# Patient Record
Sex: Male | Born: 1945
Health system: Southern US, Community
[De-identification: ages and names within clinical notes are randomized; demographics above are authoritative.]

## PROBLEM LIST (undated history)

## (undated) DIAGNOSIS — N289 Disorder of kidney and ureter, unspecified: Secondary | ICD-10-CM

## (undated) DIAGNOSIS — D689 Coagulation defect, unspecified: Secondary | ICD-10-CM

## (undated) DIAGNOSIS — C801 Malignant (primary) neoplasm, unspecified: Secondary | ICD-10-CM

## (undated) DIAGNOSIS — H269 Unspecified cataract: Secondary | ICD-10-CM

## (undated) DIAGNOSIS — C61 Malignant neoplasm of prostate: Secondary | ICD-10-CM

## (undated) DIAGNOSIS — H409 Unspecified glaucoma: Secondary | ICD-10-CM

## (undated) DIAGNOSIS — I82409 Acute embolism and thrombosis of unspecified deep veins of unspecified lower extremity: Secondary | ICD-10-CM

## (undated) DIAGNOSIS — Z923 Personal history of irradiation: Secondary | ICD-10-CM

## (undated) DIAGNOSIS — I10 Essential (primary) hypertension: Secondary | ICD-10-CM

## (undated) DIAGNOSIS — M1990 Unspecified osteoarthritis, unspecified site: Secondary | ICD-10-CM

## (undated) DIAGNOSIS — M109 Gout, unspecified: Secondary | ICD-10-CM

## (undated) DIAGNOSIS — M25559 Pain in unspecified hip: Secondary | ICD-10-CM

## (undated) HISTORY — DX: Personal history of irradiation: Z92.3

## (undated) HISTORY — DX: Coagulation defect, unspecified: D68.9

## (undated) HISTORY — DX: Unspecified osteoarthritis, unspecified site: M19.90

## (undated) HISTORY — PX: JOINT REPLACEMENT: SHX530

## (undated) HISTORY — DX: Unspecified cataract: H26.9

## (undated) HISTORY — DX: Acute embolism and thrombosis of unspecified deep veins of unspecified lower extremity: I82.409

## (undated) HISTORY — PX: SUPRAPUBIC PROSTATECTOMY: SUR1056

---

## 1997-09-25 ENCOUNTER — Ambulatory Visit (HOSPITAL_COMMUNITY): Admission: RE | Admit: 1997-09-25 | Discharge: 1997-09-25 | Payer: Self-pay | Admitting: *Deleted

## 1999-12-20 ENCOUNTER — Emergency Department (HOSPITAL_COMMUNITY): Admission: EM | Admit: 1999-12-20 | Discharge: 1999-12-20 | Payer: Self-pay | Admitting: Emergency Medicine

## 1999-12-20 ENCOUNTER — Encounter: Payer: Self-pay | Admitting: Emergency Medicine

## 2003-01-19 ENCOUNTER — Ambulatory Visit (HOSPITAL_COMMUNITY): Admission: RE | Admit: 2003-01-19 | Discharge: 2003-01-19 | Payer: Self-pay | Admitting: Internal Medicine

## 2003-01-19 ENCOUNTER — Encounter: Payer: Self-pay | Admitting: Internal Medicine

## 2003-02-05 ENCOUNTER — Inpatient Hospital Stay (HOSPITAL_COMMUNITY): Admission: RE | Admit: 2003-02-05 | Discharge: 2003-02-08 | Payer: Self-pay | Admitting: Orthopedic Surgery

## 2003-03-14 ENCOUNTER — Encounter: Admission: RE | Admit: 2003-03-14 | Discharge: 2003-03-14 | Payer: Self-pay | Admitting: Internal Medicine

## 2003-04-02 ENCOUNTER — Ambulatory Visit (HOSPITAL_COMMUNITY): Admission: RE | Admit: 2003-04-02 | Discharge: 2003-04-02 | Payer: Self-pay | Admitting: Gastroenterology

## 2010-08-14 HISTORY — PX: COLONOSCOPY: SHX174

## 2011-05-08 DIAGNOSIS — N529 Male erectile dysfunction, unspecified: Secondary | ICD-10-CM | POA: Diagnosis not present

## 2011-05-08 DIAGNOSIS — C61 Malignant neoplasm of prostate: Secondary | ICD-10-CM | POA: Diagnosis not present

## 2011-05-15 DIAGNOSIS — Z79899 Other long term (current) drug therapy: Secondary | ICD-10-CM | POA: Diagnosis not present

## 2011-05-15 DIAGNOSIS — M169 Osteoarthritis of hip, unspecified: Secondary | ICD-10-CM | POA: Diagnosis not present

## 2011-05-15 DIAGNOSIS — Z7901 Long term (current) use of anticoagulants: Secondary | ICD-10-CM | POA: Diagnosis not present

## 2011-05-15 DIAGNOSIS — Z7982 Long term (current) use of aspirin: Secondary | ICD-10-CM | POA: Diagnosis not present

## 2011-05-15 DIAGNOSIS — M25559 Pain in unspecified hip: Secondary | ICD-10-CM | POA: Diagnosis not present

## 2011-05-15 DIAGNOSIS — M1610 Unilateral primary osteoarthritis, unspecified hip: Secondary | ICD-10-CM | POA: Diagnosis not present

## 2011-05-18 DIAGNOSIS — Z7901 Long term (current) use of anticoagulants: Secondary | ICD-10-CM | POA: Diagnosis not present

## 2011-05-26 DIAGNOSIS — M1610 Unilateral primary osteoarthritis, unspecified hip: Secondary | ICD-10-CM | POA: Diagnosis not present

## 2011-05-26 DIAGNOSIS — M169 Osteoarthritis of hip, unspecified: Secondary | ICD-10-CM | POA: Diagnosis not present

## 2011-05-26 DIAGNOSIS — M25559 Pain in unspecified hip: Secondary | ICD-10-CM | POA: Diagnosis not present

## 2011-06-04 DIAGNOSIS — Z7901 Long term (current) use of anticoagulants: Secondary | ICD-10-CM | POA: Diagnosis not present

## 2011-06-18 DIAGNOSIS — I824Y9 Acute embolism and thrombosis of unspecified deep veins of unspecified proximal lower extremity: Secondary | ICD-10-CM | POA: Diagnosis not present

## 2011-06-18 DIAGNOSIS — M25559 Pain in unspecified hip: Secondary | ICD-10-CM | POA: Diagnosis not present

## 2011-06-18 DIAGNOSIS — Z7901 Long term (current) use of anticoagulants: Secondary | ICD-10-CM | POA: Diagnosis not present

## 2011-06-18 DIAGNOSIS — Z5181 Encounter for therapeutic drug level monitoring: Secondary | ICD-10-CM | POA: Diagnosis not present

## 2011-06-18 DIAGNOSIS — Z01818 Encounter for other preprocedural examination: Secondary | ICD-10-CM | POA: Diagnosis not present

## 2011-06-25 DIAGNOSIS — Z7901 Long term (current) use of anticoagulants: Secondary | ICD-10-CM | POA: Diagnosis not present

## 2011-06-30 DIAGNOSIS — Z86718 Personal history of other venous thrombosis and embolism: Secondary | ICD-10-CM | POA: Diagnosis not present

## 2011-06-30 DIAGNOSIS — Z5181 Encounter for therapeutic drug level monitoring: Secondary | ICD-10-CM | POA: Diagnosis not present

## 2011-06-30 DIAGNOSIS — Z7901 Long term (current) use of anticoagulants: Secondary | ICD-10-CM | POA: Diagnosis not present

## 2011-07-09 DIAGNOSIS — M25559 Pain in unspecified hip: Secondary | ICD-10-CM | POA: Diagnosis not present

## 2011-07-20 DIAGNOSIS — I824Y9 Acute embolism and thrombosis of unspecified deep veins of unspecified proximal lower extremity: Secondary | ICD-10-CM | POA: Diagnosis not present

## 2011-08-05 DIAGNOSIS — N529 Male erectile dysfunction, unspecified: Secondary | ICD-10-CM | POA: Diagnosis not present

## 2011-08-05 DIAGNOSIS — N390 Urinary tract infection, site not specified: Secondary | ICD-10-CM | POA: Diagnosis not present

## 2011-08-16 DIAGNOSIS — M169 Osteoarthritis of hip, unspecified: Secondary | ICD-10-CM | POA: Diagnosis not present

## 2011-08-16 DIAGNOSIS — I129 Hypertensive chronic kidney disease with stage 1 through stage 4 chronic kidney disease, or unspecified chronic kidney disease: Secondary | ICD-10-CM | POA: Diagnosis not present

## 2011-08-16 DIAGNOSIS — E785 Hyperlipidemia, unspecified: Secondary | ICD-10-CM | POA: Diagnosis not present

## 2011-08-16 DIAGNOSIS — N183 Chronic kidney disease, stage 3 unspecified: Secondary | ICD-10-CM | POA: Diagnosis not present

## 2011-08-16 DIAGNOSIS — M1610 Unilateral primary osteoarthritis, unspecified hip: Secondary | ICD-10-CM | POA: Diagnosis not present

## 2011-08-16 DIAGNOSIS — M25559 Pain in unspecified hip: Secondary | ICD-10-CM | POA: Diagnosis not present

## 2011-08-17 DIAGNOSIS — I824Y9 Acute embolism and thrombosis of unspecified deep veins of unspecified proximal lower extremity: Secondary | ICD-10-CM | POA: Diagnosis not present

## 2011-08-19 DIAGNOSIS — M25559 Pain in unspecified hip: Secondary | ICD-10-CM | POA: Diagnosis not present

## 2011-08-19 DIAGNOSIS — M169 Osteoarthritis of hip, unspecified: Secondary | ICD-10-CM | POA: Diagnosis not present

## 2011-08-19 DIAGNOSIS — N183 Chronic kidney disease, stage 3 unspecified: Secondary | ICD-10-CM | POA: Diagnosis not present

## 2011-08-19 DIAGNOSIS — M109 Gout, unspecified: Secondary | ICD-10-CM | POA: Diagnosis not present

## 2011-08-19 DIAGNOSIS — I129 Hypertensive chronic kidney disease with stage 1 through stage 4 chronic kidney disease, or unspecified chronic kidney disease: Secondary | ICD-10-CM | POA: Diagnosis not present

## 2011-08-19 DIAGNOSIS — I82819 Embolism and thrombosis of superficial veins of unspecified lower extremities: Secondary | ICD-10-CM | POA: Diagnosis not present

## 2011-08-19 DIAGNOSIS — M948X9 Other specified disorders of cartilage, unspecified sites: Secondary | ICD-10-CM | POA: Diagnosis not present

## 2011-08-19 DIAGNOSIS — Z01818 Encounter for other preprocedural examination: Secondary | ICD-10-CM | POA: Diagnosis not present

## 2011-08-19 DIAGNOSIS — Z87440 Personal history of urinary (tract) infections: Secondary | ICD-10-CM | POA: Diagnosis not present

## 2011-08-19 DIAGNOSIS — M1610 Unilateral primary osteoarthritis, unspecified hip: Secondary | ICD-10-CM | POA: Diagnosis not present

## 2011-08-19 DIAGNOSIS — Z79899 Other long term (current) drug therapy: Secondary | ICD-10-CM | POA: Diagnosis not present

## 2011-08-19 DIAGNOSIS — Z8546 Personal history of malignant neoplasm of prostate: Secondary | ICD-10-CM | POA: Diagnosis not present

## 2011-08-19 DIAGNOSIS — E785 Hyperlipidemia, unspecified: Secondary | ICD-10-CM | POA: Diagnosis not present

## 2011-08-19 DIAGNOSIS — Z7982 Long term (current) use of aspirin: Secondary | ICD-10-CM | POA: Diagnosis not present

## 2011-09-01 DIAGNOSIS — I129 Hypertensive chronic kidney disease with stage 1 through stage 4 chronic kidney disease, or unspecified chronic kidney disease: Secondary | ICD-10-CM | POA: Diagnosis not present

## 2011-09-01 DIAGNOSIS — M169 Osteoarthritis of hip, unspecified: Secondary | ICD-10-CM | POA: Diagnosis not present

## 2011-09-01 DIAGNOSIS — M25559 Pain in unspecified hip: Secondary | ICD-10-CM | POA: Diagnosis not present

## 2011-09-01 DIAGNOSIS — E785 Hyperlipidemia, unspecified: Secondary | ICD-10-CM | POA: Diagnosis not present

## 2011-09-01 DIAGNOSIS — M1610 Unilateral primary osteoarthritis, unspecified hip: Secondary | ICD-10-CM | POA: Diagnosis not present

## 2011-09-01 DIAGNOSIS — M948X9 Other specified disorders of cartilage, unspecified sites: Secondary | ICD-10-CM | POA: Diagnosis not present

## 2011-09-01 DIAGNOSIS — N183 Chronic kidney disease, stage 3 unspecified: Secondary | ICD-10-CM | POA: Diagnosis not present

## 2011-09-13 DIAGNOSIS — N390 Urinary tract infection, site not specified: Secondary | ICD-10-CM | POA: Diagnosis present

## 2011-09-13 DIAGNOSIS — M1A9XX0 Chronic gout, unspecified, without tophus (tophi): Secondary | ICD-10-CM | POA: Diagnosis not present

## 2011-09-13 DIAGNOSIS — I129 Hypertensive chronic kidney disease with stage 1 through stage 4 chronic kidney disease, or unspecified chronic kidney disease: Secondary | ICD-10-CM | POA: Diagnosis not present

## 2011-09-13 DIAGNOSIS — Z8546 Personal history of malignant neoplasm of prostate: Secondary | ICD-10-CM | POA: Diagnosis not present

## 2011-09-13 DIAGNOSIS — Z7982 Long term (current) use of aspirin: Secondary | ICD-10-CM | POA: Diagnosis not present

## 2011-09-13 DIAGNOSIS — A498 Other bacterial infections of unspecified site: Secondary | ICD-10-CM | POA: Diagnosis present

## 2011-09-13 DIAGNOSIS — Z86718 Personal history of other venous thrombosis and embolism: Secondary | ICD-10-CM | POA: Diagnosis not present

## 2011-09-13 DIAGNOSIS — Z7901 Long term (current) use of anticoagulants: Secondary | ICD-10-CM | POA: Diagnosis not present

## 2011-09-13 DIAGNOSIS — M1A00X0 Idiopathic chronic gout, unspecified site, without tophus (tophi): Secondary | ICD-10-CM | POA: Diagnosis not present

## 2011-09-13 DIAGNOSIS — N189 Chronic kidney disease, unspecified: Secondary | ICD-10-CM | POA: Diagnosis not present

## 2011-09-13 DIAGNOSIS — E785 Hyperlipidemia, unspecified: Secondary | ICD-10-CM | POA: Diagnosis present

## 2011-09-13 DIAGNOSIS — M25473 Effusion, unspecified ankle: Secondary | ICD-10-CM | POA: Diagnosis not present

## 2011-09-13 DIAGNOSIS — M899 Disorder of bone, unspecified: Secondary | ICD-10-CM | POA: Diagnosis not present

## 2011-09-13 DIAGNOSIS — M7750 Other enthesopathy of unspecified foot: Secondary | ICD-10-CM | POA: Diagnosis not present

## 2011-09-13 DIAGNOSIS — L02619 Cutaneous abscess of unspecified foot: Secondary | ICD-10-CM | POA: Diagnosis not present

## 2011-09-13 DIAGNOSIS — M109 Gout, unspecified: Secondary | ICD-10-CM | POA: Diagnosis not present

## 2011-09-13 DIAGNOSIS — E860 Dehydration: Secondary | ICD-10-CM | POA: Diagnosis present

## 2011-09-13 DIAGNOSIS — M25476 Effusion, unspecified foot: Secondary | ICD-10-CM | POA: Diagnosis not present

## 2011-09-13 DIAGNOSIS — N179 Acute kidney failure, unspecified: Secondary | ICD-10-CM | POA: Diagnosis present

## 2011-09-13 DIAGNOSIS — L03119 Cellulitis of unspecified part of limb: Secondary | ICD-10-CM | POA: Diagnosis not present

## 2011-09-13 DIAGNOSIS — M19079 Primary osteoarthritis, unspecified ankle and foot: Secondary | ICD-10-CM | POA: Diagnosis not present

## 2011-09-13 DIAGNOSIS — N183 Chronic kidney disease, stage 3 unspecified: Secondary | ICD-10-CM | POA: Diagnosis present

## 2011-09-16 DIAGNOSIS — N189 Chronic kidney disease, unspecified: Secondary | ICD-10-CM | POA: Diagnosis not present

## 2011-09-16 DIAGNOSIS — N390 Urinary tract infection, site not specified: Secondary | ICD-10-CM | POA: Diagnosis not present

## 2011-09-16 DIAGNOSIS — M109 Gout, unspecified: Secondary | ICD-10-CM | POA: Diagnosis not present

## 2011-09-22 DIAGNOSIS — I824Y9 Acute embolism and thrombosis of unspecified deep veins of unspecified proximal lower extremity: Secondary | ICD-10-CM | POA: Diagnosis not present

## 2011-09-29 DIAGNOSIS — M109 Gout, unspecified: Secondary | ICD-10-CM | POA: Diagnosis not present

## 2011-09-29 DIAGNOSIS — I129 Hypertensive chronic kidney disease with stage 1 through stage 4 chronic kidney disease, or unspecified chronic kidney disease: Secondary | ICD-10-CM | POA: Diagnosis not present

## 2011-09-29 DIAGNOSIS — Z7901 Long term (current) use of anticoagulants: Secondary | ICD-10-CM | POA: Diagnosis not present

## 2011-09-29 DIAGNOSIS — N183 Chronic kidney disease, stage 3 unspecified: Secondary | ICD-10-CM | POA: Diagnosis not present

## 2011-09-29 DIAGNOSIS — M25559 Pain in unspecified hip: Secondary | ICD-10-CM | POA: Diagnosis not present

## 2011-10-05 DIAGNOSIS — N390 Urinary tract infection, site not specified: Secondary | ICD-10-CM | POA: Diagnosis not present

## 2011-10-05 DIAGNOSIS — Z7901 Long term (current) use of anticoagulants: Secondary | ICD-10-CM | POA: Diagnosis not present

## 2011-10-05 DIAGNOSIS — I824Y9 Acute embolism and thrombosis of unspecified deep veins of unspecified proximal lower extremity: Secondary | ICD-10-CM | POA: Diagnosis not present

## 2011-10-05 DIAGNOSIS — A498 Other bacterial infections of unspecified site: Secondary | ICD-10-CM | POA: Diagnosis not present

## 2011-10-05 DIAGNOSIS — N529 Male erectile dysfunction, unspecified: Secondary | ICD-10-CM | POA: Diagnosis not present

## 2011-10-05 DIAGNOSIS — M1610 Unilateral primary osteoarthritis, unspecified hip: Secondary | ICD-10-CM | POA: Diagnosis not present

## 2011-10-05 DIAGNOSIS — Z01818 Encounter for other preprocedural examination: Secondary | ICD-10-CM | POA: Diagnosis not present

## 2011-10-05 DIAGNOSIS — Z86718 Personal history of other venous thrombosis and embolism: Secondary | ICD-10-CM | POA: Diagnosis not present

## 2011-10-05 DIAGNOSIS — N183 Chronic kidney disease, stage 3 unspecified: Secondary | ICD-10-CM | POA: Diagnosis not present

## 2011-10-05 DIAGNOSIS — C61 Malignant neoplasm of prostate: Secondary | ICD-10-CM | POA: Diagnosis not present

## 2011-10-05 DIAGNOSIS — E785 Hyperlipidemia, unspecified: Secondary | ICD-10-CM | POA: Diagnosis not present

## 2011-10-05 DIAGNOSIS — M169 Osteoarthritis of hip, unspecified: Secondary | ICD-10-CM | POA: Diagnosis not present

## 2011-10-05 DIAGNOSIS — D649 Anemia, unspecified: Secondary | ICD-10-CM | POA: Diagnosis not present

## 2011-10-05 DIAGNOSIS — M109 Gout, unspecified: Secondary | ICD-10-CM | POA: Diagnosis not present

## 2011-10-05 DIAGNOSIS — M25559 Pain in unspecified hip: Secondary | ICD-10-CM | POA: Diagnosis not present

## 2011-10-05 DIAGNOSIS — I129 Hypertensive chronic kidney disease with stage 1 through stage 4 chronic kidney disease, or unspecified chronic kidney disease: Secondary | ICD-10-CM | POA: Diagnosis not present

## 2011-10-13 DIAGNOSIS — Z5309 Procedure and treatment not carried out because of other contraindication: Secondary | ICD-10-CM | POA: Diagnosis not present

## 2011-10-13 DIAGNOSIS — R791 Abnormal coagulation profile: Secondary | ICD-10-CM | POA: Diagnosis not present

## 2011-10-13 DIAGNOSIS — M109 Gout, unspecified: Secondary | ICD-10-CM | POA: Diagnosis not present

## 2011-10-13 DIAGNOSIS — M25559 Pain in unspecified hip: Secondary | ICD-10-CM | POA: Diagnosis not present

## 2011-10-20 DIAGNOSIS — M1A00X0 Idiopathic chronic gout, unspecified site, without tophus (tophi): Secondary | ICD-10-CM | POA: Diagnosis not present

## 2011-10-20 DIAGNOSIS — M25559 Pain in unspecified hip: Secondary | ICD-10-CM | POA: Diagnosis not present

## 2011-10-20 DIAGNOSIS — Z8546 Personal history of malignant neoplasm of prostate: Secondary | ICD-10-CM | POA: Diagnosis not present

## 2011-10-20 DIAGNOSIS — N183 Chronic kidney disease, stage 3 unspecified: Secondary | ICD-10-CM | POA: Diagnosis not present

## 2011-10-20 DIAGNOSIS — Z5309 Procedure and treatment not carried out because of other contraindication: Secondary | ICD-10-CM | POA: Diagnosis not present

## 2011-10-20 DIAGNOSIS — D638 Anemia in other chronic diseases classified elsewhere: Secondary | ICD-10-CM | POA: Diagnosis not present

## 2011-10-20 DIAGNOSIS — I129 Hypertensive chronic kidney disease with stage 1 through stage 4 chronic kidney disease, or unspecified chronic kidney disease: Secondary | ICD-10-CM | POA: Diagnosis not present

## 2011-10-20 DIAGNOSIS — R791 Abnormal coagulation profile: Secondary | ICD-10-CM | POA: Diagnosis not present

## 2011-10-20 DIAGNOSIS — G8929 Other chronic pain: Secondary | ICD-10-CM | POA: Diagnosis not present

## 2011-10-29 ENCOUNTER — Inpatient Hospital Stay (HOSPITAL_COMMUNITY)
Admission: EM | Admit: 2011-10-29 | Discharge: 2011-11-02 | DRG: 554 | Disposition: A | Discharge status: Home / Self Care | Payer: Medicare Other | Attending: Internal Medicine | Admitting: Internal Medicine

## 2011-10-29 ENCOUNTER — Encounter (HOSPITAL_COMMUNITY): Payer: Self-pay | Admitting: Emergency Medicine

## 2011-10-29 DIAGNOSIS — Z8546 Personal history of malignant neoplasm of prostate: Secondary | ICD-10-CM

## 2011-10-29 DIAGNOSIS — I10 Essential (primary) hypertension: Secondary | ICD-10-CM | POA: Diagnosis present

## 2011-10-29 DIAGNOSIS — I129 Hypertensive chronic kidney disease with stage 1 through stage 4 chronic kidney disease, or unspecified chronic kidney disease: Secondary | ICD-10-CM | POA: Diagnosis present

## 2011-10-29 DIAGNOSIS — N183 Chronic kidney disease, stage 3 unspecified: Secondary | ICD-10-CM | POA: Diagnosis present

## 2011-10-29 DIAGNOSIS — M19079 Primary osteoarthritis, unspecified ankle and foot: Secondary | ICD-10-CM | POA: Diagnosis not present

## 2011-10-29 DIAGNOSIS — Z23 Encounter for immunization: Secondary | ICD-10-CM

## 2011-10-29 DIAGNOSIS — I82409 Acute embolism and thrombosis of unspecified deep veins of unspecified lower extremity: Secondary | ICD-10-CM | POA: Diagnosis present

## 2011-10-29 DIAGNOSIS — Z79899 Other long term (current) drug therapy: Secondary | ICD-10-CM

## 2011-10-29 DIAGNOSIS — IMO0002 Reserved for concepts with insufficient information to code with codable children: Secondary | ICD-10-CM

## 2011-10-29 DIAGNOSIS — Z7901 Long term (current) use of anticoagulants: Secondary | ICD-10-CM

## 2011-10-29 DIAGNOSIS — N390 Urinary tract infection, site not specified: Secondary | ICD-10-CM | POA: Diagnosis present

## 2011-10-29 DIAGNOSIS — Z7982 Long term (current) use of aspirin: Secondary | ICD-10-CM

## 2011-10-29 DIAGNOSIS — M7989 Other specified soft tissue disorders: Secondary | ICD-10-CM | POA: Diagnosis not present

## 2011-10-29 DIAGNOSIS — R509 Fever, unspecified: Secondary | ICD-10-CM | POA: Diagnosis not present

## 2011-10-29 DIAGNOSIS — Z86718 Personal history of other venous thrombosis and embolism: Secondary | ICD-10-CM

## 2011-10-29 DIAGNOSIS — M79609 Pain in unspecified limb: Secondary | ICD-10-CM | POA: Diagnosis not present

## 2011-10-29 DIAGNOSIS — N189 Chronic kidney disease, unspecified: Secondary | ICD-10-CM | POA: Diagnosis present

## 2011-10-29 DIAGNOSIS — M109 Gout, unspecified: Secondary | ICD-10-CM | POA: Diagnosis not present

## 2011-10-29 HISTORY — DX: Gout, unspecified: M10.9

## 2011-10-29 HISTORY — DX: Disorder of kidney and ureter, unspecified: N28.9

## 2011-10-29 HISTORY — DX: Essential (primary) hypertension: I10

## 2011-10-29 HISTORY — DX: Malignant (primary) neoplasm, unspecified: C80.1

## 2011-10-29 HISTORY — DX: Malignant neoplasm of prostate: C61

## 2011-10-29 HISTORY — DX: Pain in unspecified hip: M25.559

## 2011-10-29 NOTE — ED Notes (Signed)
C/o continued gout pain and swelling to bilateral feet.  Discharged from [facility] for same 4 weeks ago.  Reports weakness to bilateral legs that pt relates to gout.

## 2011-10-30 ENCOUNTER — Encounter (HOSPITAL_COMMUNITY): Payer: Self-pay | Admitting: Internal Medicine

## 2011-10-30 ENCOUNTER — Emergency Department (HOSPITAL_COMMUNITY): Payer: Medicare Other

## 2011-10-30 DIAGNOSIS — Z8546 Personal history of malignant neoplasm of prostate: Secondary | ICD-10-CM | POA: Diagnosis not present

## 2011-10-30 DIAGNOSIS — Z86718 Personal history of other venous thrombosis and embolism: Secondary | ICD-10-CM | POA: Diagnosis not present

## 2011-10-30 DIAGNOSIS — N183 Chronic kidney disease, stage 3 unspecified: Secondary | ICD-10-CM | POA: Diagnosis present

## 2011-10-30 DIAGNOSIS — IMO0002 Reserved for concepts with insufficient information to code with codable children: Secondary | ICD-10-CM | POA: Diagnosis not present

## 2011-10-30 DIAGNOSIS — M109 Gout, unspecified: Secondary | ICD-10-CM | POA: Diagnosis not present

## 2011-10-30 DIAGNOSIS — M79609 Pain in unspecified limb: Secondary | ICD-10-CM | POA: Diagnosis not present

## 2011-10-30 DIAGNOSIS — M7989 Other specified soft tissue disorders: Secondary | ICD-10-CM | POA: Diagnosis not present

## 2011-10-30 DIAGNOSIS — Z79899 Other long term (current) drug therapy: Secondary | ICD-10-CM | POA: Diagnosis not present

## 2011-10-30 DIAGNOSIS — I82409 Acute embolism and thrombosis of unspecified deep veins of unspecified lower extremity: Secondary | ICD-10-CM | POA: Diagnosis present

## 2011-10-30 DIAGNOSIS — N189 Chronic kidney disease, unspecified: Secondary | ICD-10-CM | POA: Diagnosis present

## 2011-10-30 DIAGNOSIS — M19079 Primary osteoarthritis, unspecified ankle and foot: Secondary | ICD-10-CM | POA: Diagnosis not present

## 2011-10-30 DIAGNOSIS — Z7901 Long term (current) use of anticoagulants: Secondary | ICD-10-CM | POA: Diagnosis not present

## 2011-10-30 DIAGNOSIS — I10 Essential (primary) hypertension: Secondary | ICD-10-CM | POA: Diagnosis present

## 2011-10-30 DIAGNOSIS — I129 Hypertensive chronic kidney disease with stage 1 through stage 4 chronic kidney disease, or unspecified chronic kidney disease: Secondary | ICD-10-CM | POA: Diagnosis present

## 2011-10-30 DIAGNOSIS — Z23 Encounter for immunization: Secondary | ICD-10-CM | POA: Diagnosis not present

## 2011-10-30 DIAGNOSIS — Z7982 Long term (current) use of aspirin: Secondary | ICD-10-CM | POA: Diagnosis not present

## 2011-10-30 DIAGNOSIS — N390 Urinary tract infection, site not specified: Secondary | ICD-10-CM | POA: Diagnosis present

## 2011-10-30 LAB — POCT I-STAT, CHEM 8
BUN: 24 mg/dL — ABNORMAL HIGH (ref 6–23)
Chloride: 104 mEq/L (ref 96–112)
Creatinine, Ser: 1.6 mg/dL — ABNORMAL HIGH (ref 0.50–1.35)
Potassium: 4.6 mEq/L (ref 3.5–5.1)
Sodium: 140 mEq/L (ref 135–145)

## 2011-10-30 LAB — COMPREHENSIVE METABOLIC PANEL
AST: 14 U/L (ref 0–37)
Albumin: 2.9 g/dL — ABNORMAL LOW (ref 3.5–5.2)
Calcium: 9.8 mg/dL (ref 8.4–10.5)
Chloride: 99 mEq/L (ref 96–112)
Creatinine, Ser: 1.49 mg/dL — ABNORMAL HIGH (ref 0.50–1.35)

## 2011-10-30 LAB — PROTIME-INR: INR: 2 — ABNORMAL HIGH (ref 0.00–1.49)

## 2011-10-30 LAB — CBC
HCT: 35 % — ABNORMAL LOW (ref 39.0–52.0)
HCT: 38.7 % — ABNORMAL LOW (ref 39.0–52.0)
Hemoglobin: 13 g/dL (ref 13.0–17.0)
MCV: 91.4 fL (ref 78.0–100.0)
MCV: 91.5 fL (ref 78.0–100.0)
RBC: 3.83 MIL/uL — ABNORMAL LOW (ref 4.22–5.81)
WBC: 11.3 10*3/uL — ABNORMAL HIGH (ref 4.0–10.5)
WBC: 8.7 10*3/uL (ref 4.0–10.5)

## 2011-10-30 MED ORDER — SODIUM CHLORIDE 0.9 % IV SOLN
INTRAVENOUS | Status: AC
  Administered 2011-10-30 (×2): via INTRAVENOUS

## 2011-10-30 MED ORDER — WARFARIN - PHARMACIST DOSING INPATIENT: Freq: Every day | Status: DC

## 2011-10-30 MED ORDER — METHYLPREDNISOLONE SODIUM SUCC 40 MG IJ SOLR
40.0000 mg | Freq: Two times a day (BID) | INTRAMUSCULAR | Status: DC
  Administered 2011-10-30 – 2011-10-31 (×3): 40 mg via INTRAVENOUS
  Filled 2011-10-30 (×7): qty 1

## 2011-10-30 MED ORDER — ONDANSETRON HCL 4 MG/2ML IJ SOLN
4.0000 mg | Freq: Once | INTRAMUSCULAR | Status: AC
  Administered 2011-10-30: 4 mg via INTRAVENOUS
  Filled 2011-10-30: qty 2

## 2011-10-30 MED ORDER — ONDANSETRON HCL 4 MG PO TABS: 4.0000 mg | ORAL_TABLET | Freq: Four times a day (QID) | ORAL | Status: DC | PRN

## 2011-10-30 MED ORDER — SODIUM CHLORIDE 0.9 % IV SOLN
INTRAVENOUS | Status: DC
  Administered 2011-10-30: 04:00:00 via INTRAVENOUS

## 2011-10-30 MED ORDER — HYDROMORPHONE HCL PF 1 MG/ML IJ SOLN
0.5000 mg | INTRAMUSCULAR | Status: DC | PRN
  Administered 2011-10-30 – 2011-11-01 (×5): 0.5 mg via INTRAVENOUS
  Filled 2011-10-30 (×5): qty 1

## 2011-10-30 MED ORDER — ACETAMINOPHEN 325 MG PO TABS: 650.0000 mg | ORAL_TABLET | Freq: Four times a day (QID) | ORAL | Status: DC | PRN

## 2011-10-30 MED ORDER — ALLOPURINOL 100 MG PO TABS
100.0000 mg | ORAL_TABLET | Freq: Every day | ORAL | Status: DC
  Administered 2011-10-30 – 2011-11-02 (×4): 100 mg via ORAL
  Filled 2011-10-30 (×5): qty 1

## 2011-10-30 MED ORDER — WARFARIN SODIUM 2.5 MG PO TABS: 2.5000 mg | ORAL_TABLET | ORAL | Status: DC

## 2011-10-30 MED ORDER — WARFARIN SODIUM 5 MG PO TABS
5.0000 mg | ORAL_TABLET | ORAL | Status: DC
  Administered 2011-10-30 – 2011-10-31 (×2): 5 mg via ORAL
  Filled 2011-10-30 (×4): qty 1

## 2011-10-30 MED ORDER — METHYLPREDNISOLONE SODIUM SUCC 125 MG IJ SOLR
125.0000 mg | Freq: Once | INTRAMUSCULAR | Status: AC
  Administered 2011-10-30: 125 mg via INTRAVENOUS
  Filled 2011-10-30: qty 2

## 2011-10-30 MED ORDER — ONDANSETRON HCL 4 MG/2ML IJ SOLN: 4.0000 mg | Freq: Four times a day (QID) | INTRAMUSCULAR | Status: DC | PRN

## 2011-10-30 MED ORDER — ACETAMINOPHEN 650 MG RE SUPP: 650.0000 mg | Freq: Four times a day (QID) | RECTAL | Status: DC | PRN

## 2011-10-30 MED ORDER — HYDROMORPHONE HCL PF 1 MG/ML IJ SOLN
1.0000 mg | Freq: Once | INTRAMUSCULAR | Status: AC
  Administered 2011-10-30: 1 mg via INTRAVENOUS

## 2011-10-30 MED ORDER — PNEUMOCOCCAL VAC POLYVALENT 25 MCG/0.5ML IJ INJ: 0.5000 mL | INJECTION | INTRAMUSCULAR | Status: DC

## 2011-10-30 MED ORDER — PANTOPRAZOLE SODIUM 40 MG PO TBEC
40.0000 mg | DELAYED_RELEASE_TABLET | Freq: Every day | ORAL | Status: DC
  Administered 2011-10-30 – 2011-11-02 (×4): 40 mg via ORAL
  Filled 2011-10-30 (×4): qty 1

## 2011-10-30 MED ORDER — OMEGA-3 FATTY ACIDS 1000 MG PO CAPS: 2.0000 g | ORAL_CAPSULE | Freq: Every day | ORAL | Status: DC

## 2011-10-30 MED ORDER — OMEGA-3-ACID ETHYL ESTERS 1 G PO CAPS
2.0000 g | ORAL_CAPSULE | Freq: Every day | ORAL | Status: DC
  Administered 2011-10-30 – 2011-11-02 (×4): 2 g via ORAL
  Filled 2011-10-30 (×5): qty 2

## 2011-10-30 MED ORDER — LISINOPRIL 20 MG PO TABS
20.0000 mg | ORAL_TABLET | Freq: Every day | ORAL | Status: DC
  Administered 2011-10-30 – 2011-11-02 (×4): 20 mg via ORAL
  Filled 2011-10-30 (×5): qty 1

## 2011-10-30 MED ORDER — PNEUMOCOCCAL VAC POLYVALENT 25 MCG/0.5ML IJ INJ
0.5000 mL | INJECTION | INTRAMUSCULAR | Status: AC
  Administered 2011-10-31: 0.5 mL via INTRAMUSCULAR
  Filled 2011-10-30: qty 0.5

## 2011-10-30 MED ORDER — HYDROMORPHONE HCL PF 1 MG/ML IJ SOLN
1.0000 mg | Freq: Once | INTRAMUSCULAR | Status: AC
  Administered 2011-10-30: 1 mg via INTRAVENOUS
  Filled 2011-10-30: qty 1

## 2011-10-30 MED ORDER — HYDROMORPHONE HCL PF 1 MG/ML IJ SOLN
INTRAMUSCULAR | Status: AC
  Administered 2011-10-30: 1 mg via INTRAVENOUS
  Filled 2011-10-30: qty 1

## 2011-10-30 NOTE — Progress Notes (Signed)
Utilization review completed. Damondre Pfeifle, RN, BSN. 

## 2011-10-30 NOTE — Progress Notes (Signed)
ANTICOAGULATION CONSULT NOTE - Initial Consult  Pharmacy Consult for Coumadin Indication: h/o DVT  No Known Allergies  Patient Measurements: Height: 6' (182.9 cm) Weight: 200 lb (90.719 kg) IBW/kg (Calculated) : 77.6   Vital Signs: Temp: 98.6 F (37 C) (06/28 0228) Temp src: Oral (06/28 0228) BP: 115/73 mmHg (06/28 0228) Pulse Rate: 71  (06/28 0228)  Labs:  Basename 10/30/11 0041 10/30/11 0016  HGB 13.3 13.0  HCT 39.0 38.7*  PLT -- 207  APTT -- --  LABPROT -- 23.0*  INR -- 2.00*  HEPARINUNFRC -- --  CREATININE 1.60* --  CKTOTAL -- --  CKMB -- --  TROPONINI -- --    Estimated Creatinine Clearance: 49.8 ml/min (by C-G formula based on Cr of 1.6).   Medical History: Past Medical History  Diagnosis Date  . Gout   . Cancer   . Hip pain   . Prostate cancer   . Renal disorder   . Hypertension     Medications:  Prescriptions prior to admission  Medication Sig Dispense Refill  . allopurinol (ZYLOPRIM) 100 MG tablet Take 100 mg by mouth daily.      Marland Kitchen aspirin EC 81 MG tablet Take 81 mg by mouth daily.      . cholecalciferol (VITAMIN D) 1000 UNITS tablet Take 1,000 Units by mouth daily.      . fish oil-omega-3 fatty acids 1000 MG capsule Take 2 g by mouth daily.      Marland Kitchen lisinopril (PRINIVIL,ZESTRIL) 20 MG tablet Take 20 mg by mouth daily.      Marland Kitchen warfarin (COUMADIN) 2.5 MG tablet Take 2.5 mg by mouth daily. Take two tablets everyday except Tuesday. Take one tablet on Tuesdays.        Assessment: 66 yo male admitted with gout, h/o DVT, to continue anticoagulation  Goal of Therapy:  INR 2-3   Plan:  Continue home regimen Daily INR  Meria Crilly, Bronson Curb 10/30/2011,5:34 AM

## 2011-10-30 NOTE — H&P (Signed)
Douglas Hoffman is an 66 y.o. male.   PCP - Dr.Jennifer Hoffman in [place]. Chief Complaint: Painful feet. HPI: 66 year-old male with known history of gout, hypertension and chronic kidney disease who had been in [facility] a month ago for severe gouty attack of his feet and at that time was treated with IV steroids and got better to the point he was able to walk without difficulty came to the ER because of increasing pain in both his feet. Since his discharge from [facility] patient has been noticing increasing swelling in his right feet and from last 4 days his left feet. It is also painful. Denies any fever chills and the episode just like the gout he gets. The swelling and pain makes him difficult to ambulate and has been admitted for pain control and IV steroids.  Past Medical History  Diagnosis Date  . Gout   . Cancer   . Hip pain   . Prostate cancer   . Renal disorder   . Hypertension     History reviewed. No pertinent past surgical history.  History reviewed. No pertinent family history. Social History:  reports that he has never smoked. He does not have any smokeless tobacco history on file. He reports that he does not drink alcohol or use illicit drugs.  Allergies: No Known Allergies   (Not in a hospital admission)  Results for orders placed during the hospital encounter of 10/29/11 (from the past 48 hour(s))  CBC     Status: Abnormal   Collection Time   10/30/11 12:16 AM      Component Value Range Comment   WBC 11.3 (*) 4.0 - 10.5 K/uL    RBC 4.23  4.22 - 5.81 MIL/uL    Hemoglobin 13.0  13.0 - 17.0 g/dL    HCT 38.7 (*) 39.0 - 52.0 %    MCV 91.5  78.0 - 100.0 fL    MCH 30.7  26.0 - 34.0 pg    MCHC 33.6  30.0 - 36.0 g/dL    RDW 13.8  11.5 - 15.5 %    Platelets 207  150 - 400 K/uL   PROTIME-INR     Status: Abnormal   Collection Time   10/30/11 12:16 AM      Component Value Range Comment   Prothrombin Time 23.0 (*) 11.6 - 15.2 seconds      INR 2.00 (*) 0.00 - 1.49   POCT I-STAT, CHEM 8     Status: Abnormal   Collection Time   10/30/11 12:41 AM      Component Value Range Comment   Sodium 140  135 - 145 mEq/L    Potassium 4.6  3.5 - 5.1 mEq/L    Chloride 104  96 - 112 mEq/L    BUN 24 (*) 6 - 23 mg/dL    Creatinine, Ser 1.60 (*) 0.50 - 1.35 mg/dL    Glucose, Bld 118 (*) 70 - 99 mg/dL    Calcium, Ion 1.30  1.12 - 1.32 mmol/L    TCO2 24  0 - 100 mmol/L    Hemoglobin 13.3  13.0 - 17.0 g/dL    HCT 39.0  39.0 - 52.0 %    Dg Foot Complete Left  10/30/2011  *RADIOLOGY REPORT*  Clinical Data: Pain and swelling in both feet.  No known injury. History of gout.  LEFT FOOT - COMPLETE 3+ VIEW  Comparison: None.  Findings: Mild soft tissue swelling over the fifth metatarsal- phalangeal joint.  No focal bone erosions demonstrated.  No evidence of acute fracture or subluxation.  Degenerative changes in the ankle.  IMPRESSION: No acute bony abnormalities.  No bony changes of gout identified.  Original Report Authenticated By: Douglas Hoffman, M.D.   Dg Foot Complete Right  10/30/2011  *RADIOLOGY REPORT*  Clinical Data: Pain and swelling of the foot.  No known injury. History of gout.  RIGHT FOOT COMPLETE - 3+ VIEW  Comparison: None.  Findings: The there is minimal erosion of the medial aspect of the first metatarsal head which might represent early changes of gout. No other focal erosive changes identified.  No evidence of acute fracture or subluxation.  Plantar and Achilles calcaneal spurs. Calcification in the distal Achilles tendon suggest calcific tendonitis.  IMPRESSION: Minimal focal erosion of the medial aspect of the right first metatarsal head suggest early changes of gout.  Calcification in the Achilles tendon may represent calcific tendonitis.  Original Report Authenticated By: Douglas Hoffman, M.D.    Review of Systems  Constitutional: Negative.   HENT: Negative.   Eyes: Negative.   Respiratory: Negative.   Cardiovascular:  Negative.   Gastrointestinal: Negative.   Genitourinary: Negative.   Musculoskeletal: Negative.        Swelling and pain of both feet.  Skin: Negative.   Endo/Heme/Allergies: Negative.   Psychiatric/Behavioral: Negative.     Blood pressure 115/73, pulse 71, temperature 98.6 F (37 C), temperature source Oral, resp. rate 20, SpO2 100.00%. Physical Exam  Constitutional: He appears well-developed. No distress.  HENT:  Head: Normocephalic and atraumatic.  Right Ear: External ear normal.  Left Ear: External ear normal.  Mouth/Throat: Oropharynx is clear and moist. No oropharyngeal exudate.  Eyes: Conjunctivae are normal. Right eye exhibits no discharge. Left eye exhibits no discharge.  Neck: Normal range of motion. Neck supple.  Cardiovascular: Normal rate and regular rhythm.   Respiratory: Effort normal and breath sounds normal. No respiratory distress. He has no wheezes. He has no rales.  GI: Soft. Bowel sounds are normal. He exhibits no distension. There is no tenderness. There is no rebound.  Musculoskeletal: Normal range of motion. He exhibits edema (swelling of both feet moe on the right side. ). He exhibits no tenderness.  Neurological: He is alert.  Skin: Skin is warm and dry. He is not diaphoretic.  Psychiatric: His behavior is normal.     Assessment/Plan #1. Bilateral feet gout attack - patient has got IV steroids one dose which we will continue. Continue his allopurinol for now and check uric acid levels. #2. History of DVT on Coumadin - continue Coumadin per pharmacy. #3. Hypertension - continue present medication. #4. History of chronic kidney disease  - follow metabolic panel closely. Intake output. Patient does not recall the baseline creatinine.   CODE STATUS - full code.   Douglas Hoffman N. 10/30/2011, 5:01 AM

## 2011-10-30 NOTE — Progress Notes (Signed)
[facility] PROGRESS NOTE  General Filosa [id number] DOB: 1945-06-09 DOA: 10/29/2011 PCP: William Hamburger, MD  Assessment/Plan: 1.  #1. Bilateral feet gout attack - patient has got IV steroids in ID, continue iv solumedrol. Continue his allopurinol for now and check uric acid levels.  #2. History of DVT on Coumadin - continue Coumadin per pharmacy.  #3. Hypertension - continue present medication.  #4. History of chronic kidney disease - follow metabolic panel closely. Intake output. Patient does not recall the baseline creatinine.  Principal Problem:  *Gout attack Active Problems:  CKD (chronic kidney disease)  HTN (hypertension)  DVT (deep venous thrombosis)  Code Status: full code Family Communication: discussed with family at bedside Disposition Plan: home  [place], MD  [facility] Pager [phone]  If 7PM-7AM, please contact night-coverage [email or web address] Password [facility] 10/30/2011, 9:43 AM   LOS: 1 day   Brief narrative:   Antibiotics:  none  HPI/Subjective: Pain is improved.  Objective: Filed Vitals:   10/29/11 2047 10/30/11 0228 10/30/11 0526 10/30/11 0603  BP: 134/81 115/73  116/71  Pulse: 93 71  71  Temp: 99.5 F (37.5 C) 98.6 F (37 C)  98.1 F (36.7 C)  TempSrc: Oral Oral  Oral  Resp: 19 20  18   Height:   6' (1.829 m)   Weight:   90.719 kg (200 lb)   SpO2: 98% 100%  100%   No intake or output data in the 24 hours ending 10/30/11 0943  Exam:   General:  Alert afebrile comfortable  Cardiovascular: s1s2 RRR  Respiratory: CTAB  Abdomen: SOFT NT ND BS+  Extremities: right swollen ankle. tender  Data Reviewed: Basic Metabolic Panel:  Lab [id number]  NA 140  K 4.6  CL 104  CO2 --  GLUCOSE 118*  BUN 24*  CREATININE 1.60*  CALCIUM --  MG --  PHOS --   Liver Function Tests: No results found for this basename: AST:5,ALT:5,ALKPHOS:5,BILITOT:5,PROT:5,ALBUMIN:5 in the last 168 hours No results found for  this basename: LIPASE:5,AMYLASE:5 in the last 168 hours No results found for this basename: AMMONIA:5 in the last 168 hours CBC:  Lab 10/30/11 0041 10/30/11 0016  WBC -- 11.3*  NEUTROABS -- --  HGB 13.3 13.0  HCT 39.0 38.7*  MCV -- 91.5  PLT -- 207   Cardiac Enzymes: No results found for this basename: CKTOTAL:5,CKMB:5,CKMBINDEX:5,TROPONINI:5 in the last 168 hours BNP (last 3 results) No results found for this basename: PROBNP:3 in the last 8760 hours CBG: No results found for this basename: GLUCAP:5 in the last 168 hours  No results found for this or any previous visit (from the past 240 hour(s)).   Studies: Dg Foot Complete Left  10/30/2011  *RADIOLOGY REPORT*  Clinical Data: Pain and swelling in both feet.  No known injury. History of gout.  LEFT FOOT - COMPLETE 3+ VIEW  Comparison: None.  Findings: Mild soft tissue swelling over the fifth metatarsal- phalangeal joint.  No focal bone erosions demonstrated.  No evidence of acute fracture or subluxation.  Degenerative changes in the ankle.  IMPRESSION: No acute bony abnormalities.  No bony changes of gout identified.  Original Report Authenticated By: Neale Burly, M.D.   Dg Foot Complete Right  10/30/2011  *RADIOLOGY REPORT*  Clinical Data: Pain and swelling of the foot.  No known injury. History of gout.  RIGHT FOOT COMPLETE - 3+ VIEW  Comparison: None.  Findings: The there is minimal erosion of the medial aspect of the first metatarsal head which might represent  early changes of gout. No other focal erosive changes identified.  No evidence of acute fracture or subluxation.  Plantar and Achilles calcaneal spurs. Calcification in the distal Achilles tendon suggest calcific tendonitis.  IMPRESSION: Minimal focal erosion of the medial aspect of the right first metatarsal head suggest early changes of gout.  Calcification in the Achilles tendon may represent calcific tendonitis.  Original Report Authenticated By: Neale Burly,  M.D.    Scheduled Meds:   . allopurinol  100 mg Oral Daily  .  HYDROmorphone (DILAUDID) injection  1 mg Intravenous Once  .  HYDROmorphone (DILAUDID) injection  1 mg Intravenous Once  . lisinopril  20 mg Oral Daily  . methylPREDNISolone (SOLU-MEDROL) injection  125 mg Intravenous Once  . methylPREDNISolone (SOLU-MEDROL) injection  40 mg Intravenous Q12H  . omega-3 acid ethyl esters  2 g Oral Daily  . ondansetron  4 mg Intravenous Once  . pantoprazole  40 mg Oral Q1200  . pneumococcal 23 valent vaccine  0.5 mL Intramuscular Tomorrow-1000  . warfarin  2.5 mg Oral Q Tue-1800  . warfarin  5 mg Oral Custom  . Warfarin - Pharmacist Dosing Inpatient   Does not apply q1800  . DISCONTD: fish oil-omega-3 fatty acids  2 g Oral Daily  . DISCONTD: pneumococcal 23 valent vaccine  0.5 mL Intramuscular Tomorrow-1000   Continuous Infusions:   . sodium chloride 125 mL/hr at 10/30/11 0643  . DISCONTD: sodium chloride 125 mL/hr at 10/30/11 0358

## 2011-10-30 NOTE — ED Notes (Signed)
Wife notified about pt's admit room number

## 2011-10-30 NOTE — ED Notes (Signed)
Patient transported to X-ray 

## 2011-10-30 NOTE — ED Provider Notes (Signed)
History     CSN: [id number]  Arrival date & time 10/29/11  2041   First MD Initiated Contact with Patient 10/30/11 0016      Chief Complaint  Patient presents with  . Gout    (Consider location/radiation/quality/duration/timing/severity/associated sxs/prior treatment) HPI History provided by patient. Long-standing history of gout. When he has bad flares like this he typically requires admission at [facility]. Patient lives locally and prefers to be admitted here if he needs to. Fever today of 100.9 with chills. Is taking allopurinol without relief. States steroids usually help but not for a couple days. Pain is severe unable to walk due to pain. No chest pain or shortness of breath. Has history of DVTs and is on Coumadin. No trauma. 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 Past Medical History  Diagnosis Date  . Gout   . Cancer   . Hip pain   . Prostate cancer   . Renal disorder     History reviewed. No pertinent past surgical history.  No family history on file.  History  Substance Use Topics  . Smoking status: Never Smoker   . Smokeless tobacco: Not on file  . Alcohol Use: No      Review of Systems  Constitutional: Negative for fever and chills.  HENT: Negative for neck pain and neck stiffness.   Eyes: Negative for pain.  Respiratory: Negative for shortness of breath.   Cardiovascular: Negative for chest pain.  Gastrointestinal: Negative for nausea, vomiting and abdominal pain.  Genitourinary: Negative for dysuria.  Musculoskeletal: Negative for back pain.  Skin: Positive for color change.  Neurological: Negative for headaches.  All other systems reviewed and are negative.    Allergies  Review of patient's allergies indicates no known allergies.  Home Medications   Current Outpatient Rx  Name Route Sig Dispense Refill  . ALLOPURINOL 100 MG PO TABS Oral Take 100 mg by mouth daily.    . ASPIRIN EC 81 MG PO TBEC Oral Take 81 mg by mouth daily.    Marland Kitchen  VITAMIN D 1000 UNITS PO TABS Oral Take 1,000 Units by mouth daily.    . OMEGA-3 FATTY ACIDS 1000 MG PO CAPS Oral Take 2 g by mouth daily.    Marland Kitchen LISINOPRIL 20 MG PO TABS Oral Take 20 mg by mouth daily.    . WARFARIN SODIUM 2.5 MG PO TABS Oral Take 2.5 mg by mouth daily. Take two tablets everyday except Tuesday. Take one tablet on Tuesdays.      BP 115/73  Pulse 71  Temp 98.6 F (37 C) (Oral)  Resp 20  SpO2 100%  Physical Exam  Constitutional: He is oriented to person, place, and time. He appears well-developed and well-nourished.  HENT:  Head: Normocephalic and atraumatic.  Eyes: Conjunctivae and EOM are normal. Pupils are equal, round, and reactive to light.  Neck: Trachea normal. Neck supple. No thyromegaly present.  Cardiovascular: Normal rate, regular rhythm, S1 normal, S2 normal and normal pulses.     No systolic murmur is present   No diastolic murmur is present  Pulses:      Radial pulses are 2+ on the right side, and 2+ on the left side.  Pulmonary/Chest: Effort normal and breath sounds normal. He has no wheezes. He has no rhonchi. He has no rales. He exhibits no tenderness.  Abdominal: Soft. Normal appearance and bowel sounds are normal. There is no tenderness. There is no CVA tenderness and negative Murphy's sign.  Musculoskeletal:  Right greater than left foot swelling tenderness, erythema and increased warmth to touch. Some dry skin but no open skin lesions. No bony tenderness or deformity. Severe tenderness with light palpation  Neurological: He is alert and oriented to person, place, and time. He has normal strength. No cranial nerve deficit or sensory deficit. GCS eye subscore is 4. GCS verbal subscore is 5. GCS motor subscore is 6.  Skin: Skin is warm and dry. No rash noted. He is not diaphoretic.  Psychiatric: His speech is normal.       Cooperative and appropriate    ED Course  Procedures (including critical care time)  Results for orders placed during the  hospital encounter of 10/29/11  CBC      Component Value Range   WBC 11.3 (*) 4.0 - 10.5 K/uL   RBC 4.23  4.22 - 5.81 MIL/uL   Hemoglobin 13.0  13.0 - 17.0 g/dL   HCT 38.7 (*) 39.0 - 52.0 %   MCV 91.5  78.0 - 100.0 fL   MCH 30.7  26.0 - 34.0 pg   MCHC 33.6  30.0 - 36.0 g/dL   RDW 13.8  11.5 - 15.5 %   Platelets 207  150 - 400 K/uL  PROTIME-INR      Component Value Range   Prothrombin Time 23.0 (*) 11.6 - 15.2 seconds   INR 2.00 (*) 0.00 - 1.49  POCT I-STAT, CHEM 8      Component Value Range   Sodium 140  135 - 145 mEq/L   Potassium 4.6  3.5 - 5.1 mEq/L   Chloride 104  96 - 112 mEq/L   BUN 24 (*) 6 - 23 mg/dL   Creatinine, Ser 1.60 (*) 0.50 - 1.35 mg/dL   Glucose, Bld 118 (*) 70 - 99 mg/dL   Calcium, Ion 1.30  1.12 - 1.32 mmol/L   TCO2 24  0 - 100 mmol/L   Hemoglobin 13.3  13.0 - 17.0 g/dL   HCT 39.0  39.0 - 52.0 %   IV steroids. IV dilaudid  Serial evaluations with no pain control achieved. Repeat IV Dilaudid. Medicine consult for admit and further evaluation. MDM   3:47 AM case discussed as above with Dr. Rush Landmark, will admit OBS  Labs obtained and reviewed as above. Imaging obtained. Nurse's notes reviewed.      Teressa Lower, MD 10/30/11 [phone]

## 2011-10-31 DIAGNOSIS — N189 Chronic kidney disease, unspecified: Secondary | ICD-10-CM

## 2011-10-31 DIAGNOSIS — Z86718 Personal history of other venous thrombosis and embolism: Secondary | ICD-10-CM

## 2011-10-31 DIAGNOSIS — M109 Gout, unspecified: Secondary | ICD-10-CM

## 2011-10-31 DIAGNOSIS — I10 Essential (primary) hypertension: Secondary | ICD-10-CM

## 2011-10-31 MED ORDER — PREDNISONE 50 MG PO TABS
60.0000 mg | ORAL_TABLET | Freq: Every day | ORAL | Status: DC
  Administered 2011-10-31 – 2011-11-01 (×2): 60 mg via ORAL
  Filled 2011-10-31 (×3): qty 1

## 2011-10-31 NOTE — Progress Notes (Signed)
ANTICOAGULATION CONSULT NOTE - Follow-up Consult  Pharmacy Consult for Coumadin Indication: h/o DVT  No Known Allergies  Patient Measurements: Height: 6' (182.9 cm) Weight: 200 lb (90.719 kg) IBW/kg (Calculated) : 77.6   Vital Signs: Temp: 97.7 F (36.5 C) (06/29 0539) BP: 121/75 mmHg (06/29 0539) Pulse Rate: 68  (06/29 0539)  Labs:  Basename 10/31/11 0505 10/30/11 1001 10/30/11 0041 10/30/11 0016  HGB -- 11.7* 13.3 --  HCT -- 35.0* 39.0 38.7*  PLT -- 191 -- 207  APTT -- -- -- --  LABPROT 27.2* -- -- 23.0*  INR 2.47* -- -- 2.00*  HEPARINUNFRC -- -- -- --  CREATININE -- 1.49* 1.60* --  CKTOTAL -- -- -- --  CKMB -- -- -- --  TROPONINI -- -- -- --    Estimated Creatinine Clearance: 53.5 ml/min (by C-G formula based on Cr of 1.49).   Medical History: Past Medical History  Diagnosis Date  . Gout   . Cancer   . Hip pain   . Prostate cancer   . Renal disorder   . Hypertension     Medications:  Prescriptions prior to admission  Medication Sig Dispense Refill  . allopurinol (ZYLOPRIM) 100 MG tablet Take 100 mg by mouth daily.      Marland Kitchen aspirin EC 81 MG tablet Take 81 mg by mouth daily.      . cholecalciferol (VITAMIN D) 1000 UNITS tablet Take 1,000 Units by mouth daily.      . fish oil-omega-3 fatty acids 1000 MG capsule Take 2 g by mouth daily.      Marland Kitchen lisinopril (PRINIVIL,ZESTRIL) 20 MG tablet Take 20 mg by mouth daily.      Marland Kitchen warfarin (COUMADIN) 2.5 MG tablet Take 2.5 mg by mouth daily. Take two tablets everyday except Tuesday. Take one tablet on Tuesdays.        Assessment: 66 yo male admitted with gout, h/o DVT, to continue anticoagulation.  INR = 2.47 this am. Hgb=11.7 (dec from yday), Platelets stable.   Goal of Therapy:  INR 2-3   Plan:  Continue home regimen of coumadin 5mg  daily except  2.5 mg on tues.  Watch INR trend as steroids can increase effect of coumadin Daily INR  Elby Showers, Osa Craver 10/31/2011,10:20 AM

## 2011-10-31 NOTE — Progress Notes (Signed)
[facility] PROGRESS NOTE  Douglas Hoffman [id number] DOB: Oct 03, 1945 DOA: 10/29/2011 PCP: William Hamburger, MD  Assessment/Plan: 1.  #1. Bilateral feet gout attack - patient has got IV steroids in ID, on po steroids. Continue his allopurinol for now and uric acid is 7.5 #2. History of DVT on Coumadin - continue Coumadin per pharmacy.  #3. Hypertension - continue present medication.  #4. History of chronic kidney disease - follow metabolic panel closely.  Creatinine improved. Intake output. Patient does not recall the baseline creatinine.  Principal Problem:  *Gout attack Active Problems:  CKD (chronic kidney disease)  HTN (hypertension)  DVT (deep venous thrombosis)  Code Status: full code Family Communication: discussed with family at bedside Disposition Plan: home  [place], MD  [facility] Pager [phone]  If 7PM-7AM, please contact night-coverage [email or web address] Password [facility] 10/31/2011, 5:36 PM   LOS: 2 days   Brief narrative:   Antibiotics:  none  HPI/Subjective: Pain is improved.  Objective: Filed Vitals:   10/30/11 1300 10/30/11 2038 10/31/11 0539 10/31/11 1300  BP: 110/66 114/68 121/75 117/71  Pulse: 73 76 68 60  Temp: 98.5 F (36.9 C) 97.9 F (36.6 C) 97.7 F (36.5 C) 98 F (36.7 C)  TempSrc: Oral   Oral  Resp: 18 18 18 18   Height:      Weight:      SpO2: 100% 100% 100% 100%    Intake/Output Summary (Last 24 hours) at 10/31/11 1736 Last data filed at 10/31/11 0659  Gross per 24 hour  Intake   3480 ml  Output    700 ml  Net   2780 ml    Exam:   General:  Alert afebrile comfortable  Cardiovascular: s1s2 RRR  Respiratory: CTAB  Abdomen: SOFT NT ND BS+  Extremities: right swollen ankle. tender  Data Reviewed: Basic Metabolic Panel:  Lab [id number] 1001 10/30/11 0041  NA 134* 140  K 4.2 4.6  CL 99 104  CO2 24 --  GLUCOSE 178* 118*  BUN 30* 24*  CREATININE 1.49* 1.60*  CALCIUM 9.8 --  MG -- --    PHOS -- --   Liver Function Tests:  Lab 10/30/11 1001  AST 14  ALT 11  ALKPHOS 71  BILITOT 0.4  PROT 7.3  ALBUMIN 2.9*   No results found for this basename: LIPASE:5,AMYLASE:5 in the last 168 hours No results found for this basename: AMMONIA:5 in the last 168 hours CBC:  Lab 10/30/11 1001 10/30/11 0041 10/30/11 0016  WBC 8.7 -- 11.3*  NEUTROABS -- -- --  HGB 11.7* 13.3 13.0  HCT 35.0* 39.0 38.7*  MCV 91.4 -- 91.5  PLT 191 -- 207   Cardiac Enzymes: No results found for this basename: CKTOTAL:5,CKMB:5,CKMBINDEX:5,TROPONINI:5 in the last 168 hours BNP (last 3 results) No results found for this basename: PROBNP:3 in the last 8760 hours CBG: No results found for this basename: GLUCAP:5 in the last 168 hours  No results found for this or any previous visit (from the past 240 hour(s)).   Studies: Dg Foot Complete Left  10/30/2011  *RADIOLOGY REPORT*  Clinical Data: Pain and swelling in both feet.  No known injury. History of gout.  LEFT FOOT - COMPLETE 3+ VIEW  Comparison: None.  Findings: Mild soft tissue swelling over the fifth metatarsal- phalangeal joint.  No focal bone erosions demonstrated.  No evidence of acute fracture or subluxation.  Degenerative changes in the ankle.  IMPRESSION: No acute bony abnormalities.  No bony changes of gout identified.  Original Report  Authenticated By: Neale Burly, M.D.   Dg Foot Complete Right  10/30/2011  *RADIOLOGY REPORT*  Clinical Data: Pain and swelling of the foot.  No known injury. History of gout.  RIGHT FOOT COMPLETE - 3+ VIEW  Comparison: None.  Findings: The there is minimal erosion of the medial aspect of the first metatarsal head which might represent early changes of gout. No other focal erosive changes identified.  No evidence of acute fracture or subluxation.  Plantar and Achilles calcaneal spurs. Calcification in the distal Achilles tendon suggest calcific tendonitis.  IMPRESSION: Minimal focal erosion of the medial  aspect of the right first metatarsal head suggest early changes of gout.  Calcification in the Achilles tendon may represent calcific tendonitis.  Original Report Authenticated By: Neale Burly, M.D.    Scheduled Meds:    . allopurinol  100 mg Oral Daily  . lisinopril  20 mg Oral Daily  . omega-3 acid ethyl esters  2 g Oral Daily  . pantoprazole  40 mg Oral Q1200  . pneumococcal 23 valent vaccine  0.5 mL Intramuscular Tomorrow-1000  . predniSONE  60 mg Oral Q breakfast  . warfarin  2.5 mg Oral Q Tue-1800  . warfarin  5 mg Oral Custom  . Warfarin - Pharmacist Dosing Inpatient   Does not apply q1800  . DISCONTD: methylPREDNISolone (SOLU-MEDROL) injection  40 mg Intravenous Q12H   Continuous Infusions:    . sodium chloride 125 mL/hr at 10/30/11 1316

## 2011-11-01 DIAGNOSIS — M109 Gout, unspecified: Secondary | ICD-10-CM

## 2011-11-01 DIAGNOSIS — N189 Chronic kidney disease, unspecified: Secondary | ICD-10-CM

## 2011-11-01 DIAGNOSIS — I10 Essential (primary) hypertension: Secondary | ICD-10-CM

## 2011-11-01 DIAGNOSIS — Z86718 Personal history of other venous thrombosis and embolism: Secondary | ICD-10-CM

## 2011-11-01 LAB — BASIC METABOLIC PANEL
BUN: 32 mg/dL — ABNORMAL HIGH (ref 6–23)
GFR calc Af Amer: 59 mL/min — ABNORMAL LOW (ref >90)
GFR calc non Af Amer: 51 mL/min — ABNORMAL LOW (ref >90)
Potassium: 4.2 mEq/L (ref 3.5–5.1)
Sodium: 142 mEq/L (ref 135–145)

## 2011-11-01 LAB — URINALYSIS, ROUTINE W REFLEX MICROSCOPIC
Glucose, UA: NEGATIVE mg/dL
pH: 6 (ref 5.0–8.0)

## 2011-11-01 LAB — URINE MICROSCOPIC-ADD ON

## 2011-11-01 LAB — PROTIME-INR: Prothrombin Time: 34.7 seconds — ABNORMAL HIGH (ref 11.6–15.2)

## 2011-11-01 MED ORDER — PREDNISONE 20 MG PO TABS
40.0000 mg | ORAL_TABLET | Freq: Every day | ORAL | Status: DC
  Administered 2011-11-02: 40 mg via ORAL
  Filled 2011-11-01 (×2): qty 2

## 2011-11-01 MED ORDER — COLCHICINE 0.6 MG PO TABS
0.6000 mg | ORAL_TABLET | Freq: Every day | ORAL | Status: DC
  Administered 2011-11-01 – 2011-11-02 (×2): 0.6 mg via ORAL
  Filled 2011-11-01 (×2): qty 1

## 2011-11-01 MED ORDER — OXYCODONE HCL 5 MG PO TABS
5.0000 mg | ORAL_TABLET | ORAL | Status: DC | PRN
  Administered 2011-11-01 – 2011-11-02 (×2): 5 mg via ORAL
  Filled 2011-11-01 (×2): qty 1

## 2011-11-01 MED ORDER — WARFARIN SODIUM 2.5 MG PO TABS
2.5000 mg | ORAL_TABLET | Freq: Every day | ORAL | Status: DC
  Filled 2011-11-01: qty 1

## 2011-11-01 MED ORDER — SENNOSIDES-DOCUSATE SODIUM 8.6-50 MG PO TABS
2.0000 | ORAL_TABLET | Freq: Two times a day (BID) | ORAL | Status: DC
  Administered 2011-11-01 – 2011-11-02 (×3): 2 via ORAL
  Filled 2011-11-01: qty 1
  Filled 2011-11-01 (×2): qty 2

## 2011-11-01 NOTE — Progress Notes (Signed)
ANTICOAGULATION CONSULT NOTE - Follow-up Consult  Pharmacy Consult for Coumadin Indication: h/o DVT  No Known Allergies  Patient Measurements: Height: 6' (182.9 cm) Weight: 200 lb (90.719 kg) IBW/kg (Calculated) : 77.6   Vital Signs: Temp: 98.2 F (36.8 C) (06/30 0523) Temp src: Oral (06/30 0523) BP: 119/69 mmHg (06/30 0523) Pulse Rate: 65  (06/30 0523)  Labs:  Basename 11/01/11 0405 10/31/11 0505 10/30/11 1001 10/30/11 0041 10/30/11 0016  HGB -- -- 11.7* 13.3 --  HCT -- -- 35.0* 39.0 38.7*  PLT -- -- 191 -- 207  APTT -- -- -- -- --  LABPROT 34.7* 27.2* -- -- 23.0*  INR 3.38* 2.47* -- -- 2.00*  HEPARINUNFRC -- -- -- -- --  CREATININE 1.40* -- 1.49* 1.60* --  CKTOTAL -- -- -- -- --  CKMB -- -- -- -- --  TROPONINI -- -- -- -- --    Estimated Creatinine Clearance: 57 ml/min (by C-G formula based on Cr of 1.4).   Medical History: Past Medical History  Diagnosis Date  . Gout   . Cancer   . Hip pain   . Prostate cancer   . Renal disorder   . Hypertension     Medications:  Prescriptions prior to admission  Medication Sig Dispense Refill  . allopurinol (ZYLOPRIM) 100 MG tablet Take 100 mg by mouth daily.      Marland Kitchen aspirin EC 81 MG tablet Take 81 mg by mouth daily.      . cholecalciferol (VITAMIN D) 1000 UNITS tablet Take 1,000 Units by mouth daily.      . fish oil-omega-3 fatty acids 1000 MG capsule Take 2 g by mouth daily.      Marland Kitchen lisinopril (PRINIVIL,ZESTRIL) 20 MG tablet Take 20 mg by mouth daily.      Marland Kitchen warfarin (COUMADIN) 2.5 MG tablet Take 2.5 mg by mouth daily. Take two tablets everyday except Tuesday. Take one tablet on Tuesdays.        Assessment: 66 yo male admitted with gout, h/o DVT, to continue anticoagulation.  INR = 3.38 this am. Hgb=11.7 (slight dec from admit), Platelets stable. No s/s bleeding noted.  Increased INR probably d/t to drug interaction with prednisone.  Will decrease Coumadin dose while on higher doses of prednisone.  Goal of Therapy:    INR 2-3   Plan:  No Coumadin today Decrease Coumadin 2.5mg  daily starting Monday 7/1 Watch INR trend as steroids can increase effect of coumadin Daily INR  [name] 11/01/2011,10:43 AM

## 2011-11-01 NOTE — Progress Notes (Signed)
[facility] PROGRESS NOTE  Douglas Hoffman [id number] DOB: [birth year] DOA: 10/29/2011 PCP: William Hamburger, MD  Assessment/Plan: 1.  #1. Bilateral feet gout attack - patient has got IV steroids in ID, on po steroids. Continue his allopurinol for now and uric acid is 7.5 #2. History of DVT on Coumadin - continue Coumadin per pharmacy.  #3. Hypertension - continue present medication.  #4. History of chronic kidney disease - follow metabolic panel closely.  Creatinine improved. Intake output. Patient does not recall the baseline creatinine.  Principal Problem:  *Gout attack Active Problems:  CKD (chronic kidney disease)  HTN (hypertension)  DVT (deep venous thrombosis)  Code Status: full code Family Communication: discussed with family at bedside Disposition Plan: home  [place], MD  [facility] Pager [phone]  If 7PM-7AM, please contact night-coverage [email or web address] Password [email or web address] 11/01/2011, 5:21 PM   LOS: 3 days     Antibiotics:  none  HPI/Subjective: Pain is improved.  Objective: Filed Vitals:   10/31/11 0539 10/31/11 1300 10/31/11 2043 11/01/11 0523  BP: 121/75 117/71 119/64 119/69  Pulse: 68 60 58 65  Temp: 97.7 F (36.5 C) 98 F (36.7 C) 98.3 F (36.8 C) 98.2 F (36.8 C)  TempSrc:  Oral Oral Oral  Resp: 18 18 18 18   Height:      Weight:      SpO2: 100% 100% 100% 99%   No intake or output data in the 24 hours ending 11/01/11 1721  Exam:   General:  Alert afebrile comfortable  Cardiovascular: s1s2 RRR  Respiratory: CTAB  Abdomen: SOFT NT ND BS+  Extremities: right swollen ankle. Tenderness improved.   Data Reviewed: Basic Metabolic Panel:  Lab [id number] 0405 10/30/11 1001 10/30/11 0041  NA 142 134* 140  K 4.2 4.2 4.6  CL 111 99 104  CO2 20 24 --  GLUCOSE 260* 178* 118*  BUN 32* 30* 24*  CREATININE 1.40* 1.49* 1.60*  CALCIUM 9.4 9.8 --  MG -- -- --  PHOS -- -- --   Liver Function Tests:  Lab 10/30/11  1001  AST 14  ALT 11  ALKPHOS 71  BILITOT 0.4  PROT 7.3  ALBUMIN 2.9*   No results found for this basename: LIPASE:5,AMYLASE:5 in the last 168 hours No results found for this basename: AMMONIA:5 in the last 168 hours CBC:  Lab 10/30/11 1001 10/30/11 0041 10/30/11 0016  WBC 8.7 -- 11.3*  NEUTROABS -- -- --  HGB 11.7* 13.3 13.0  HCT 35.0* 39.0 38.7*  MCV 91.4 -- 91.5  PLT 191 -- 207   Cardiac Enzymes: No results found for this basename: CKTOTAL:5,CKMB:5,CKMBINDEX:5,TROPONINI:5 in the last 168 hours BNP (last 3 results) No results found for this basename: PROBNP:3 in the last 8760 hours CBG: No results found for this basename: GLUCAP:5 in the last 168 hours  No results found for this or any previous visit (from the past 240 hour(s)).   Studies: No results found.  Scheduled Meds:    . allopurinol  100 mg Oral Daily  . lisinopril  20 mg Oral Daily  . omega-3 acid ethyl esters  2 g Oral Daily  . pantoprazole  40 mg Oral Q1200  . predniSONE  40 mg Oral QAC breakfast  . senna-docusate  2 tablet Oral BID  . warfarin  2.5 mg Oral q1800  . Warfarin - Pharmacist Dosing Inpatient   Does not apply q1800  . DISCONTD: predniSONE  60 mg Oral Q breakfast  . DISCONTD: warfarin  2.5 mg Oral Q Tue-1800  .  DISCONTD: warfarin  5 mg Oral Custom   Continuous Infusions:

## 2011-11-02 MED ORDER — WARFARIN SODIUM 2.5 MG PO TABS: 2.5000 mg | ORAL_TABLET | Freq: Every day | ORAL | Status: DC

## 2011-11-02 MED ORDER — COLCHICINE 0.6 MG PO TABS: 0.6000 mg | ORAL_TABLET | Freq: Every day | ORAL | Status: DC

## 2011-11-02 MED ORDER — PREDNISONE 20 MG PO TABS: ORAL_TABLET | ORAL | Status: DC

## 2011-11-02 MED ORDER — CIPROFLOXACIN HCL 500 MG PO TABS: 500.0000 mg | ORAL_TABLET | Freq: Two times a day (BID) | ORAL | Status: AC

## 2011-11-02 NOTE — Progress Notes (Signed)
ANTICOAGULATION CONSULT NOTE - Follow-up Consult  Pharmacy Consult for Coumadin Indication: h/o DVT  No Known Allergies  Patient Measurements: Height: 6' (182.9 cm) Weight: 200 lb (90.719 kg) IBW/kg (Calculated) : 77.6   Vital Signs: BP: 123/66 mmHg (07/01 0900) Pulse Rate: 81  (07/01 0900)  Labs:  Basename 11/02/11 0455 11/01/11 0405 10/31/11 0505 10/30/11 1001  HGB -- -- -- 11.7*  HCT -- -- -- 35.0*  PLT -- -- -- 191  APTT -- -- -- --  LABPROT 42.5* 34.7* 27.2* --  INR 4.38* 3.38* 2.47* --  HEPARINUNFRC -- -- -- --  CREATININE -- 1.40* -- 1.49*  CKTOTAL -- -- -- --  CKMB -- -- -- --  TROPONINI -- -- -- --    Estimated Creatinine Clearance: 57 ml/min (by C-G formula based on Cr of 1.4).   Medical History: Past Medical History  Diagnosis Date  . Gout   . Cancer   . Hip pain   . Prostate cancer   . Renal disorder   . Hypertension     Assessment: 66 yo male admitted with gout, h/o DVT, to continue anticoagulation.  INR = 4.38 this am. Hgb=11.7 on 6/28 (slight dec from admit), Platelets stable. No s/s bleeding noted.  Increased INR possibly d/t to drug interaction with prednisone.   Home coumadin dose is 5 mg daily except 2.5 mg on Tuesday.  Goal of Therapy:  INR 2-3   Plan:  No Coumadin today Watch INR trend as steroids can increase effect of coumadin Daily INR Eudelia Bunch, Pharm.D. [id number] 11/02/2011 9:47 AM

## 2011-11-02 NOTE — Discharge Summary (Addendum)
Physician Discharge Summary  Douglas Hoffman [id number] DOB: Sep 23, 1945 DOA: 10/29/2011  PCP: William Hamburger, MD  Admit date: 10/29/2011 Discharge date: 11/02/2011  Recommendations for Outpatient Follow-up:  1. Follow up with your rheumatologist in less than a week before the prednisone taper is finished. 2.  we started you on colchicine , please check your BMP in one week to evaluate renal function.   Discharge Diagnoses:  Principal Problem:  *Gout attack Active Problems:  CKD (chronic kidney disease)  HTN (hypertension)  DVT (deep venous thrombosis) on coumadin UTI  Discharge Condition: stable  Diet recommendation: low sodium diet.   History of present illness:  66 year-old male with known history of gout, hypertension and chronic kidney disease who had been in [facility] a month ago for severe gouty attack of his feet and at that time was treated with IV steroids and got better to the point he was able to walk without difficulty came to the ER because of increasing pain in both his feet.    Hospital Course:  #1. Bilateral feet gout attack - As per the patient this is his 3rd gout flare up this year. He has been hospitalized for similar complaints and was started on iv steroids and sent home on prednisone taper. Last hospitalization,he underwent joint aspiration and was told he  Had crystals in his ankle joint. He was sent home on medrol dose pack, on completion of the dose pack, his gout has flared up and he was admitted for management of gouty flare. patient has got IV steroids in ED, and he is being discharged on prednisone taper. Continue his allopurinol for now and uric acid is 7.5 within normal limits. He is also started on colchicine and his symptoms have completely resolved. He was able to bear weight on his ankles without any pain. The swelling has improved. He is recommended to follow up with his rheumatologist and he might possibly benefit from maintenance  dose of steroids after his hip replacement.  #2. History of DVT on Coumadin - supratherapeutic INR. Hold coumadin tonight and check INR in am.  #3. Hypertension - continue present medication.  #4. History of chronic kidney disease - follow metabolic panel closely. Creatinine improved #5 UTI: no dysuria symptoms. Discharged on ciprofloxacin.     Consultations:  NONE  Discharge Exam: Filed Vitals:   11/02/11 0900  BP: 123/66  Pulse: 81  Temp:   Resp:    Filed Vitals:   10/31/11 2043 11/01/11 0523 11/01/11 2131 11/02/11 0900  BP: 119/64 119/69 124/82 123/66  Pulse: 58 65 65 81  Temp: 98.3 F (36.8 C) 98.2 F (36.8 C) 97.4 F (36.3 C)   TempSrc: Oral Oral Oral   Resp: 18 18 16    Height:      Weight:      SpO2: 100% 99% 100%     General: Alert afebrile comfortable  Cardiovascular: s1s2 RRR  Respiratory: CTAB  Abdomen: SOFT NT ND BS+  Extremities: right ankle swelling and tenderness improved  Discharge Instructions  Discharge Orders    Future Orders Please Complete By Expires   Diet - low sodium heart healthy      Discharge instructions      Comments:   Follow up with Rheumatologist on Tuesday. As this is your 4rd episode of  hospitalization for Gouty flare , recommend maintenance dose of prednisone vs colchicine to prevent further gouty flares, would leave it your rheumatology to make further recommendations.   Activity as tolerated - No  restrictions        Medication List  As of 11/02/2011 11:54 AM   TAKE these medications         allopurinol 100 MG tablet   Commonly known as: ZYLOPRIM   Take 100 mg by mouth daily.      aspirin EC 81 MG tablet   Take 81 mg by mouth daily.      cholecalciferol 1000 UNITS tablet   Commonly known as: VITAMIN D   Take 1,000 Units by mouth daily.      ciprofloxacin 500 MG tablet   Commonly known as: CIPRO   Take 1 tablet (500 mg total) by mouth 2 (two) times daily.      colchicine 0.6 MG tablet   Take 1 tablet (0.6 mg  total) by mouth daily.      fish oil-omega-3 fatty acids 1000 MG capsule   Take 2 g by mouth daily.      lisinopril 20 MG tablet   Commonly known as: PRINIVIL,ZESTRIL   Take 20 mg by mouth daily.      predniSONE 20 MG tablet   Commonly known as: DELTASONE   Prednisone 40mg  for 2 days, followed by   prednisone 20mg  for 3 days followed by  Prednisone 10mg  for 3 days and see a rheumatologist before you stop the prednisone      warfarin 2.5 MG tablet   Commonly known as: COUMADIN   Take 1 tablet (2.5 mg total) by mouth daily. Take two tablets everyday except Tuesday. Take one tablet on Tuesdays.              The results of significant diagnostics from this hospitalization (including imaging, microbiology, ancillary and laboratory) are listed below for reference.    Significant Diagnostic Studies: Dg Foot Complete Left  10/30/2011  *RADIOLOGY REPORT*  Clinical Data: Pain and swelling in both feet.  No known injury. History of gout.  LEFT FOOT - COMPLETE 3+ VIEW  Comparison: None.  Findings: Mild soft tissue swelling over the fifth metatarsal- phalangeal joint.  No focal bone erosions demonstrated.  No evidence of acute fracture or subluxation.  Degenerative changes in the ankle.  IMPRESSION: No acute bony abnormalities.  No bony changes of gout identified.  Original Report Authenticated By: Neale Burly, M.D.   Dg Foot Complete Right  10/30/2011  *RADIOLOGY REPORT*  Clinical Data: Pain and swelling of the foot.  No known injury. History of gout.  RIGHT FOOT COMPLETE - 3+ VIEW  Comparison: None.  Findings: The there is minimal erosion of the medial aspect of the first metatarsal head which might represent early changes of gout. No other focal erosive changes identified.  No evidence of acute fracture or subluxation.  Plantar and Achilles calcaneal spurs. Calcification in the distal Achilles tendon suggest calcific tendonitis.  IMPRESSION: Minimal focal erosion of the medial aspect  of the right first metatarsal head suggest early changes of gout.  Calcification in the Achilles tendon may represent calcific tendonitis.  Original Report Authenticated By: Neale Burly, M.D.    Microbiology: No results found for this or any previous visit (from the past 240 hour(s)).   Labs: Basic Metabolic Panel:  Lab [id number] 0405 10/30/11 1001 10/30/11 0041  NA 142 134* 140  K 4.2 4.2 4.6  CL 111 99 104  CO2 20 24 --  GLUCOSE 260* 178* 118*  BUN 32* 30* 24*  CREATININE 1.40* 1.49* 1.60*  CALCIUM 9.4 9.8 --  MG -- -- --  PHOS -- -- --   Liver Function Tests:  Lab 10/30/11 1001  AST 14  ALT 11  ALKPHOS 71  BILITOT 0.4  PROT 7.3  ALBUMIN 2.9*   No results found for this basename: LIPASE:5,AMYLASE:5 in the last 168 hours No results found for this basename: AMMONIA:5 in the last 168 hours CBC:  Lab 10/30/11 1001 10/30/11 0041 10/30/11 0016  WBC 8.7 -- 11.3*  NEUTROABS -- -- --  HGB 11.7* 13.3 13.0  HCT 35.0* 39.0 38.7*  MCV 91.4 -- 91.5  PLT 191 -- 207   Cardiac Enzymes: No results found for this basename: CKTOTAL:5,CKMB:5,CKMBINDEX:5,TROPONINI:5 in the last 168 hours BNP: BNP (last 3 results) No results found for this basename: PROBNP:3 in the last 8760 hours CBG: No results found for this basename: GLUCAP:5 in the last 168 hours  Time coordinating discharge: 54 minutes  Signed:  Hosie Poisson, MD  [facility] 11/02/2011, 11:54 AM

## 2011-11-03 DIAGNOSIS — M109 Gout, unspecified: Secondary | ICD-10-CM | POA: Diagnosis not present

## 2011-11-03 DIAGNOSIS — I824Y9 Acute embolism and thrombosis of unspecified deep veins of unspecified proximal lower extremity: Secondary | ICD-10-CM | POA: Diagnosis not present

## 2011-11-13 DIAGNOSIS — Z87440 Personal history of urinary (tract) infections: Secondary | ICD-10-CM | POA: Diagnosis not present

## 2011-11-13 DIAGNOSIS — M109 Gout, unspecified: Secondary | ICD-10-CM | POA: Diagnosis not present

## 2011-11-13 DIAGNOSIS — I129 Hypertensive chronic kidney disease with stage 1 through stage 4 chronic kidney disease, or unspecified chronic kidney disease: Secondary | ICD-10-CM | POA: Diagnosis not present

## 2011-11-13 DIAGNOSIS — G8918 Other acute postprocedural pain: Secondary | ICD-10-CM | POA: Diagnosis not present

## 2011-11-13 DIAGNOSIS — N183 Chronic kidney disease, stage 3 unspecified: Secondary | ICD-10-CM | POA: Diagnosis not present

## 2011-11-13 DIAGNOSIS — M1710 Unilateral primary osteoarthritis, unspecified knee: Secondary | ICD-10-CM | POA: Diagnosis not present

## 2011-11-13 DIAGNOSIS — Z7982 Long term (current) use of aspirin: Secondary | ICD-10-CM | POA: Diagnosis not present

## 2011-11-13 DIAGNOSIS — Z86718 Personal history of other venous thrombosis and embolism: Secondary | ICD-10-CM | POA: Diagnosis not present

## 2011-11-13 DIAGNOSIS — Z96649 Presence of unspecified artificial hip joint: Secondary | ICD-10-CM | POA: Diagnosis not present

## 2011-11-13 DIAGNOSIS — M1610 Unilateral primary osteoarthritis, unspecified hip: Secondary | ICD-10-CM | POA: Diagnosis not present

## 2011-11-13 DIAGNOSIS — Z7901 Long term (current) use of anticoagulants: Secondary | ICD-10-CM | POA: Diagnosis not present

## 2011-11-13 DIAGNOSIS — I10 Essential (primary) hypertension: Secondary | ICD-10-CM | POA: Diagnosis not present

## 2011-11-13 DIAGNOSIS — Z8546 Personal history of malignant neoplasm of prostate: Secondary | ICD-10-CM | POA: Diagnosis not present

## 2011-11-13 DIAGNOSIS — M87059 Idiopathic aseptic necrosis of unspecified femur: Secondary | ICD-10-CM | POA: Diagnosis not present

## 2011-11-13 DIAGNOSIS — M25559 Pain in unspecified hip: Secondary | ICD-10-CM | POA: Diagnosis not present

## 2011-11-13 DIAGNOSIS — M169 Osteoarthritis of hip, unspecified: Secondary | ICD-10-CM | POA: Diagnosis not present

## 2011-11-13 DIAGNOSIS — Z96659 Presence of unspecified artificial knee joint: Secondary | ICD-10-CM | POA: Diagnosis not present

## 2011-11-13 DIAGNOSIS — D62 Acute posthemorrhagic anemia: Secondary | ICD-10-CM | POA: Diagnosis not present

## 2011-11-13 DIAGNOSIS — E785 Hyperlipidemia, unspecified: Secondary | ICD-10-CM | POA: Diagnosis not present

## 2011-11-17 DIAGNOSIS — Z471 Aftercare following joint replacement surgery: Secondary | ICD-10-CM | POA: Diagnosis not present

## 2011-11-17 DIAGNOSIS — Z96649 Presence of unspecified artificial hip joint: Secondary | ICD-10-CM | POA: Diagnosis not present

## 2011-11-17 DIAGNOSIS — Z5181 Encounter for therapeutic drug level monitoring: Secondary | ICD-10-CM | POA: Diagnosis not present

## 2011-11-17 DIAGNOSIS — N189 Chronic kidney disease, unspecified: Secondary | ICD-10-CM | POA: Diagnosis not present

## 2011-11-17 DIAGNOSIS — I129 Hypertensive chronic kidney disease with stage 1 through stage 4 chronic kidney disease, or unspecified chronic kidney disease: Secondary | ICD-10-CM | POA: Diagnosis not present

## 2011-11-18 DIAGNOSIS — Z471 Aftercare following joint replacement surgery: Secondary | ICD-10-CM | POA: Diagnosis not present

## 2011-11-18 DIAGNOSIS — Z5181 Encounter for therapeutic drug level monitoring: Secondary | ICD-10-CM | POA: Diagnosis not present

## 2011-11-18 DIAGNOSIS — Z96649 Presence of unspecified artificial hip joint: Secondary | ICD-10-CM | POA: Diagnosis not present

## 2011-11-18 DIAGNOSIS — N189 Chronic kidney disease, unspecified: Secondary | ICD-10-CM | POA: Diagnosis not present

## 2011-11-18 DIAGNOSIS — I129 Hypertensive chronic kidney disease with stage 1 through stage 4 chronic kidney disease, or unspecified chronic kidney disease: Secondary | ICD-10-CM | POA: Diagnosis not present

## 2011-11-19 DIAGNOSIS — Z96649 Presence of unspecified artificial hip joint: Secondary | ICD-10-CM | POA: Diagnosis not present

## 2011-11-19 DIAGNOSIS — I129 Hypertensive chronic kidney disease with stage 1 through stage 4 chronic kidney disease, or unspecified chronic kidney disease: Secondary | ICD-10-CM | POA: Diagnosis not present

## 2011-11-19 DIAGNOSIS — Z5181 Encounter for therapeutic drug level monitoring: Secondary | ICD-10-CM | POA: Diagnosis not present

## 2011-11-19 DIAGNOSIS — Z471 Aftercare following joint replacement surgery: Secondary | ICD-10-CM | POA: Diagnosis not present

## 2011-11-19 DIAGNOSIS — N189 Chronic kidney disease, unspecified: Secondary | ICD-10-CM | POA: Diagnosis not present

## 2011-11-20 DIAGNOSIS — I129 Hypertensive chronic kidney disease with stage 1 through stage 4 chronic kidney disease, or unspecified chronic kidney disease: Secondary | ICD-10-CM | POA: Diagnosis not present

## 2011-11-20 DIAGNOSIS — Z471 Aftercare following joint replacement surgery: Secondary | ICD-10-CM | POA: Diagnosis not present

## 2011-11-20 DIAGNOSIS — Z96649 Presence of unspecified artificial hip joint: Secondary | ICD-10-CM | POA: Diagnosis not present

## 2011-11-20 DIAGNOSIS — N189 Chronic kidney disease, unspecified: Secondary | ICD-10-CM | POA: Diagnosis not present

## 2011-11-20 DIAGNOSIS — Z5181 Encounter for therapeutic drug level monitoring: Secondary | ICD-10-CM | POA: Diagnosis not present

## 2011-11-23 DIAGNOSIS — Z5181 Encounter for therapeutic drug level monitoring: Secondary | ICD-10-CM | POA: Diagnosis not present

## 2011-11-23 DIAGNOSIS — I129 Hypertensive chronic kidney disease with stage 1 through stage 4 chronic kidney disease, or unspecified chronic kidney disease: Secondary | ICD-10-CM | POA: Diagnosis not present

## 2011-11-23 DIAGNOSIS — N189 Chronic kidney disease, unspecified: Secondary | ICD-10-CM | POA: Diagnosis not present

## 2011-11-23 DIAGNOSIS — Z471 Aftercare following joint replacement surgery: Secondary | ICD-10-CM | POA: Diagnosis not present

## 2011-11-23 DIAGNOSIS — Z96649 Presence of unspecified artificial hip joint: Secondary | ICD-10-CM | POA: Diagnosis not present

## 2011-11-24 DIAGNOSIS — I129 Hypertensive chronic kidney disease with stage 1 through stage 4 chronic kidney disease, or unspecified chronic kidney disease: Secondary | ICD-10-CM | POA: Diagnosis not present

## 2011-11-24 DIAGNOSIS — N189 Chronic kidney disease, unspecified: Secondary | ICD-10-CM | POA: Diagnosis not present

## 2011-11-24 DIAGNOSIS — Z5181 Encounter for therapeutic drug level monitoring: Secondary | ICD-10-CM | POA: Diagnosis not present

## 2011-11-24 DIAGNOSIS — Z471 Aftercare following joint replacement surgery: Secondary | ICD-10-CM | POA: Diagnosis not present

## 2011-11-24 DIAGNOSIS — Z96649 Presence of unspecified artificial hip joint: Secondary | ICD-10-CM | POA: Diagnosis not present

## 2011-11-25 DIAGNOSIS — Z471 Aftercare following joint replacement surgery: Secondary | ICD-10-CM | POA: Diagnosis not present

## 2011-11-25 DIAGNOSIS — Z5181 Encounter for therapeutic drug level monitoring: Secondary | ICD-10-CM | POA: Diagnosis not present

## 2011-11-25 DIAGNOSIS — I129 Hypertensive chronic kidney disease with stage 1 through stage 4 chronic kidney disease, or unspecified chronic kidney disease: Secondary | ICD-10-CM | POA: Diagnosis not present

## 2011-11-25 DIAGNOSIS — Z96649 Presence of unspecified artificial hip joint: Secondary | ICD-10-CM | POA: Diagnosis not present

## 2011-11-25 DIAGNOSIS — N189 Chronic kidney disease, unspecified: Secondary | ICD-10-CM | POA: Diagnosis not present

## 2011-11-26 DIAGNOSIS — Z96649 Presence of unspecified artificial hip joint: Secondary | ICD-10-CM | POA: Diagnosis not present

## 2011-11-26 DIAGNOSIS — Z5181 Encounter for therapeutic drug level monitoring: Secondary | ICD-10-CM | POA: Diagnosis not present

## 2011-11-26 DIAGNOSIS — N189 Chronic kidney disease, unspecified: Secondary | ICD-10-CM | POA: Diagnosis not present

## 2011-11-26 DIAGNOSIS — I129 Hypertensive chronic kidney disease with stage 1 through stage 4 chronic kidney disease, or unspecified chronic kidney disease: Secondary | ICD-10-CM | POA: Diagnosis not present

## 2011-11-26 DIAGNOSIS — Z471 Aftercare following joint replacement surgery: Secondary | ICD-10-CM | POA: Diagnosis not present

## 2011-11-27 DIAGNOSIS — Z5181 Encounter for therapeutic drug level monitoring: Secondary | ICD-10-CM | POA: Diagnosis not present

## 2011-11-27 DIAGNOSIS — N189 Chronic kidney disease, unspecified: Secondary | ICD-10-CM | POA: Diagnosis not present

## 2011-11-27 DIAGNOSIS — Z471 Aftercare following joint replacement surgery: Secondary | ICD-10-CM | POA: Diagnosis not present

## 2011-11-27 DIAGNOSIS — Z96649 Presence of unspecified artificial hip joint: Secondary | ICD-10-CM | POA: Diagnosis not present

## 2011-11-27 DIAGNOSIS — I129 Hypertensive chronic kidney disease with stage 1 through stage 4 chronic kidney disease, or unspecified chronic kidney disease: Secondary | ICD-10-CM | POA: Diagnosis not present

## 2011-11-30 DIAGNOSIS — I129 Hypertensive chronic kidney disease with stage 1 through stage 4 chronic kidney disease, or unspecified chronic kidney disease: Secondary | ICD-10-CM | POA: Diagnosis not present

## 2011-11-30 DIAGNOSIS — Z5181 Encounter for therapeutic drug level monitoring: Secondary | ICD-10-CM | POA: Diagnosis not present

## 2011-11-30 DIAGNOSIS — Z96649 Presence of unspecified artificial hip joint: Secondary | ICD-10-CM | POA: Diagnosis not present

## 2011-11-30 DIAGNOSIS — N189 Chronic kidney disease, unspecified: Secondary | ICD-10-CM | POA: Diagnosis not present

## 2011-11-30 DIAGNOSIS — Z471 Aftercare following joint replacement surgery: Secondary | ICD-10-CM | POA: Diagnosis not present

## 2011-12-01 DIAGNOSIS — I129 Hypertensive chronic kidney disease with stage 1 through stage 4 chronic kidney disease, or unspecified chronic kidney disease: Secondary | ICD-10-CM | POA: Diagnosis not present

## 2011-12-01 DIAGNOSIS — N189 Chronic kidney disease, unspecified: Secondary | ICD-10-CM | POA: Diagnosis not present

## 2011-12-01 DIAGNOSIS — Z96649 Presence of unspecified artificial hip joint: Secondary | ICD-10-CM | POA: Diagnosis not present

## 2011-12-01 DIAGNOSIS — Z5181 Encounter for therapeutic drug level monitoring: Secondary | ICD-10-CM | POA: Diagnosis not present

## 2011-12-01 DIAGNOSIS — Z471 Aftercare following joint replacement surgery: Secondary | ICD-10-CM | POA: Diagnosis not present

## 2011-12-02 DIAGNOSIS — Z86718 Personal history of other venous thrombosis and embolism: Secondary | ICD-10-CM | POA: Diagnosis not present

## 2011-12-02 DIAGNOSIS — Z96649 Presence of unspecified artificial hip joint: Secondary | ICD-10-CM | POA: Diagnosis not present

## 2011-12-02 DIAGNOSIS — Z471 Aftercare following joint replacement surgery: Secondary | ICD-10-CM | POA: Diagnosis not present

## 2011-12-02 DIAGNOSIS — Z7901 Long term (current) use of anticoagulants: Secondary | ICD-10-CM | POA: Diagnosis not present

## 2011-12-03 DIAGNOSIS — Z471 Aftercare following joint replacement surgery: Secondary | ICD-10-CM | POA: Diagnosis not present

## 2011-12-03 DIAGNOSIS — N189 Chronic kidney disease, unspecified: Secondary | ICD-10-CM | POA: Diagnosis not present

## 2011-12-03 DIAGNOSIS — Z96649 Presence of unspecified artificial hip joint: Secondary | ICD-10-CM | POA: Diagnosis not present

## 2011-12-03 DIAGNOSIS — I129 Hypertensive chronic kidney disease with stage 1 through stage 4 chronic kidney disease, or unspecified chronic kidney disease: Secondary | ICD-10-CM | POA: Diagnosis not present

## 2011-12-03 DIAGNOSIS — Z5181 Encounter for therapeutic drug level monitoring: Secondary | ICD-10-CM | POA: Diagnosis not present

## 2011-12-04 DIAGNOSIS — N189 Chronic kidney disease, unspecified: Secondary | ICD-10-CM | POA: Diagnosis not present

## 2011-12-04 DIAGNOSIS — Z5181 Encounter for therapeutic drug level monitoring: Secondary | ICD-10-CM | POA: Diagnosis not present

## 2011-12-04 DIAGNOSIS — I129 Hypertensive chronic kidney disease with stage 1 through stage 4 chronic kidney disease, or unspecified chronic kidney disease: Secondary | ICD-10-CM | POA: Diagnosis not present

## 2011-12-04 DIAGNOSIS — Z96649 Presence of unspecified artificial hip joint: Secondary | ICD-10-CM | POA: Diagnosis not present

## 2011-12-04 DIAGNOSIS — Z471 Aftercare following joint replacement surgery: Secondary | ICD-10-CM | POA: Diagnosis not present

## 2011-12-07 DIAGNOSIS — N189 Chronic kidney disease, unspecified: Secondary | ICD-10-CM | POA: Diagnosis not present

## 2011-12-07 DIAGNOSIS — M25579 Pain in unspecified ankle and joints of unspecified foot: Secondary | ICD-10-CM | POA: Diagnosis not present

## 2011-12-07 DIAGNOSIS — Z471 Aftercare following joint replacement surgery: Secondary | ICD-10-CM | POA: Diagnosis not present

## 2011-12-07 DIAGNOSIS — I129 Hypertensive chronic kidney disease with stage 1 through stage 4 chronic kidney disease, or unspecified chronic kidney disease: Secondary | ICD-10-CM | POA: Diagnosis not present

## 2011-12-07 DIAGNOSIS — Z96649 Presence of unspecified artificial hip joint: Secondary | ICD-10-CM | POA: Diagnosis not present

## 2011-12-07 DIAGNOSIS — Z5181 Encounter for therapeutic drug level monitoring: Secondary | ICD-10-CM | POA: Diagnosis not present

## 2011-12-09 DIAGNOSIS — I129 Hypertensive chronic kidney disease with stage 1 through stage 4 chronic kidney disease, or unspecified chronic kidney disease: Secondary | ICD-10-CM | POA: Diagnosis not present

## 2011-12-09 DIAGNOSIS — Z96649 Presence of unspecified artificial hip joint: Secondary | ICD-10-CM | POA: Diagnosis not present

## 2011-12-09 DIAGNOSIS — Z471 Aftercare following joint replacement surgery: Secondary | ICD-10-CM | POA: Diagnosis not present

## 2011-12-09 DIAGNOSIS — Z5181 Encounter for therapeutic drug level monitoring: Secondary | ICD-10-CM | POA: Diagnosis not present

## 2011-12-09 DIAGNOSIS — N189 Chronic kidney disease, unspecified: Secondary | ICD-10-CM | POA: Diagnosis not present

## 2011-12-11 DIAGNOSIS — I129 Hypertensive chronic kidney disease with stage 1 through stage 4 chronic kidney disease, or unspecified chronic kidney disease: Secondary | ICD-10-CM | POA: Diagnosis not present

## 2011-12-11 DIAGNOSIS — Z5181 Encounter for therapeutic drug level monitoring: Secondary | ICD-10-CM | POA: Diagnosis not present

## 2011-12-11 DIAGNOSIS — N189 Chronic kidney disease, unspecified: Secondary | ICD-10-CM | POA: Diagnosis not present

## 2011-12-11 DIAGNOSIS — Z96649 Presence of unspecified artificial hip joint: Secondary | ICD-10-CM | POA: Diagnosis not present

## 2011-12-11 DIAGNOSIS — Z471 Aftercare following joint replacement surgery: Secondary | ICD-10-CM | POA: Diagnosis not present

## 2011-12-14 DIAGNOSIS — Z96649 Presence of unspecified artificial hip joint: Secondary | ICD-10-CM | POA: Diagnosis not present

## 2011-12-14 DIAGNOSIS — Z5181 Encounter for therapeutic drug level monitoring: Secondary | ICD-10-CM | POA: Diagnosis not present

## 2011-12-14 DIAGNOSIS — Z471 Aftercare following joint replacement surgery: Secondary | ICD-10-CM | POA: Diagnosis not present

## 2011-12-14 DIAGNOSIS — N189 Chronic kidney disease, unspecified: Secondary | ICD-10-CM | POA: Diagnosis not present

## 2011-12-14 DIAGNOSIS — I129 Hypertensive chronic kidney disease with stage 1 through stage 4 chronic kidney disease, or unspecified chronic kidney disease: Secondary | ICD-10-CM | POA: Diagnosis not present

## 2011-12-16 DIAGNOSIS — Z7901 Long term (current) use of anticoagulants: Secondary | ICD-10-CM | POA: Diagnosis not present

## 2011-12-16 DIAGNOSIS — Z86718 Personal history of other venous thrombosis and embolism: Secondary | ICD-10-CM | POA: Diagnosis not present

## 2011-12-30 DIAGNOSIS — Z471 Aftercare following joint replacement surgery: Secondary | ICD-10-CM | POA: Diagnosis not present

## 2011-12-30 DIAGNOSIS — Z96649 Presence of unspecified artificial hip joint: Secondary | ICD-10-CM | POA: Diagnosis not present

## 2012-01-01 DIAGNOSIS — Z8546 Personal history of malignant neoplasm of prostate: Secondary | ICD-10-CM | POA: Diagnosis not present

## 2012-01-01 DIAGNOSIS — R5383 Other fatigue: Secondary | ICD-10-CM | POA: Diagnosis not present

## 2012-01-01 DIAGNOSIS — M109 Gout, unspecified: Secondary | ICD-10-CM | POA: Diagnosis not present

## 2012-01-01 DIAGNOSIS — N289 Disorder of kidney and ureter, unspecified: Secondary | ICD-10-CM | POA: Diagnosis not present

## 2012-01-01 DIAGNOSIS — R5381 Other malaise: Secondary | ICD-10-CM | POA: Diagnosis not present

## 2012-01-12 DIAGNOSIS — Z7901 Long term (current) use of anticoagulants: Secondary | ICD-10-CM | POA: Diagnosis not present

## 2012-01-12 DIAGNOSIS — Z86718 Personal history of other venous thrombosis and embolism: Secondary | ICD-10-CM | POA: Diagnosis not present

## 2012-01-13 DIAGNOSIS — E291 Testicular hypofunction: Secondary | ICD-10-CM | POA: Diagnosis not present

## 2012-01-13 DIAGNOSIS — R82998 Other abnormal findings in urine: Secondary | ICD-10-CM | POA: Diagnosis not present

## 2012-01-13 DIAGNOSIS — R5381 Other malaise: Secondary | ICD-10-CM | POA: Diagnosis not present

## 2012-01-13 DIAGNOSIS — N182 Chronic kidney disease, stage 2 (mild): Secondary | ICD-10-CM | POA: Diagnosis not present

## 2012-01-13 DIAGNOSIS — I10 Essential (primary) hypertension: Secondary | ICD-10-CM | POA: Diagnosis not present

## 2012-01-13 DIAGNOSIS — R5383 Other fatigue: Secondary | ICD-10-CM | POA: Diagnosis not present

## 2012-01-13 DIAGNOSIS — M109 Gout, unspecified: Secondary | ICD-10-CM | POA: Diagnosis not present

## 2012-01-14 DIAGNOSIS — H26109 Unspecified traumatic cataract, unspecified eye: Secondary | ICD-10-CM | POA: Diagnosis not present

## 2012-01-20 DIAGNOSIS — M109 Gout, unspecified: Secondary | ICD-10-CM | POA: Diagnosis not present

## 2012-01-20 DIAGNOSIS — Z23 Encounter for immunization: Secondary | ICD-10-CM | POA: Diagnosis not present

## 2012-01-20 DIAGNOSIS — Z0000 Encounter for general adult medical examination without abnormal findings: Secondary | ICD-10-CM | POA: Diagnosis not present

## 2012-01-20 DIAGNOSIS — I10 Essential (primary) hypertension: Secondary | ICD-10-CM | POA: Diagnosis not present

## 2012-01-20 DIAGNOSIS — N390 Urinary tract infection, site not specified: Secondary | ICD-10-CM | POA: Diagnosis not present

## 2012-01-21 DIAGNOSIS — N390 Urinary tract infection, site not specified: Secondary | ICD-10-CM | POA: Diagnosis not present

## 2012-01-21 DIAGNOSIS — C61 Malignant neoplasm of prostate: Secondary | ICD-10-CM | POA: Diagnosis not present

## 2012-01-27 ENCOUNTER — Encounter (INDEPENDENT_AMBULATORY_CARE_PROVIDER_SITE_OTHER): Payer: Medicare Other | Admitting: Ophthalmology

## 2012-01-27 DIAGNOSIS — H2510 Age-related nuclear cataract, unspecified eye: Secondary | ICD-10-CM | POA: Diagnosis not present

## 2012-01-27 DIAGNOSIS — H43819 Vitreous degeneration, unspecified eye: Secondary | ICD-10-CM | POA: Diagnosis not present

## 2012-01-27 DIAGNOSIS — H35039 Hypertensive retinopathy, unspecified eye: Secondary | ICD-10-CM

## 2012-01-27 DIAGNOSIS — I10 Essential (primary) hypertension: Secondary | ICD-10-CM | POA: Diagnosis not present

## 2012-02-10 DIAGNOSIS — Z96649 Presence of unspecified artificial hip joint: Secondary | ICD-10-CM | POA: Diagnosis not present

## 2012-02-10 DIAGNOSIS — Z471 Aftercare following joint replacement surgery: Secondary | ICD-10-CM | POA: Diagnosis not present

## 2012-02-22 DIAGNOSIS — N390 Urinary tract infection, site not specified: Secondary | ICD-10-CM | POA: Diagnosis not present

## 2012-02-29 DIAGNOSIS — R6882 Decreased libido: Secondary | ICD-10-CM | POA: Diagnosis not present

## 2012-02-29 DIAGNOSIS — N3020 Other chronic cystitis without hematuria: Secondary | ICD-10-CM | POA: Diagnosis not present

## 2012-02-29 DIAGNOSIS — N529 Male erectile dysfunction, unspecified: Secondary | ICD-10-CM | POA: Diagnosis not present

## 2012-02-29 DIAGNOSIS — N390 Urinary tract infection, site not specified: Secondary | ICD-10-CM | POA: Diagnosis not present

## 2012-03-09 DIAGNOSIS — M2550 Pain in unspecified joint: Secondary | ICD-10-CM | POA: Diagnosis not present

## 2012-03-09 DIAGNOSIS — M109 Gout, unspecified: Secondary | ICD-10-CM | POA: Diagnosis not present

## 2012-03-11 DIAGNOSIS — C61 Malignant neoplasm of prostate: Secondary | ICD-10-CM | POA: Diagnosis not present

## 2012-03-23 ENCOUNTER — Encounter: Payer: Self-pay | Admitting: Physician Assistant

## 2012-03-23 DIAGNOSIS — N401 Enlarged prostate with lower urinary tract symptoms: Secondary | ICD-10-CM | POA: Insufficient documentation

## 2012-03-23 DIAGNOSIS — N529 Male erectile dysfunction, unspecified: Secondary | ICD-10-CM | POA: Insufficient documentation

## 2012-03-23 DIAGNOSIS — R972 Elevated prostate specific antigen [PSA]: Secondary | ICD-10-CM

## 2012-03-23 DIAGNOSIS — R6882 Decreased libido: Secondary | ICD-10-CM

## 2012-03-23 DIAGNOSIS — N3020 Other chronic cystitis without hematuria: Secondary | ICD-10-CM | POA: Insufficient documentation

## 2012-03-23 DIAGNOSIS — C61 Malignant neoplasm of prostate: Secondary | ICD-10-CM | POA: Insufficient documentation

## 2012-04-22 DIAGNOSIS — M109 Gout, unspecified: Secondary | ICD-10-CM | POA: Diagnosis not present

## 2012-04-22 DIAGNOSIS — Z79899 Other long term (current) drug therapy: Secondary | ICD-10-CM | POA: Diagnosis not present

## 2012-04-22 DIAGNOSIS — M2550 Pain in unspecified joint: Secondary | ICD-10-CM | POA: Diagnosis not present

## 2012-04-22 DIAGNOSIS — M1990 Unspecified osteoarthritis, unspecified site: Secondary | ICD-10-CM | POA: Diagnosis not present

## 2012-05-02 DIAGNOSIS — C61 Malignant neoplasm of prostate: Secondary | ICD-10-CM | POA: Diagnosis not present

## 2012-05-02 DIAGNOSIS — N3020 Other chronic cystitis without hematuria: Secondary | ICD-10-CM | POA: Diagnosis not present

## 2012-05-02 DIAGNOSIS — N529 Male erectile dysfunction, unspecified: Secondary | ICD-10-CM | POA: Diagnosis not present

## 2012-05-02 DIAGNOSIS — E291 Testicular hypofunction: Secondary | ICD-10-CM | POA: Diagnosis not present

## 2012-05-03 DIAGNOSIS — C61 Malignant neoplasm of prostate: Secondary | ICD-10-CM | POA: Diagnosis not present

## 2012-05-03 DIAGNOSIS — E291 Testicular hypofunction: Secondary | ICD-10-CM | POA: Diagnosis not present

## 2012-05-12 DIAGNOSIS — Z471 Aftercare following joint replacement surgery: Secondary | ICD-10-CM | POA: Diagnosis not present

## 2012-05-12 DIAGNOSIS — I1310 Hypertensive heart and chronic kidney disease without heart failure, with stage 1 through stage 4 chronic kidney disease, or unspecified chronic kidney disease: Secondary | ICD-10-CM | POA: Diagnosis not present

## 2012-05-12 DIAGNOSIS — Z86718 Personal history of other venous thrombosis and embolism: Secondary | ICD-10-CM | POA: Diagnosis not present

## 2012-05-12 DIAGNOSIS — M1610 Unilateral primary osteoarthritis, unspecified hip: Secondary | ICD-10-CM | POA: Diagnosis not present

## 2012-05-12 DIAGNOSIS — Z96649 Presence of unspecified artificial hip joint: Secondary | ICD-10-CM | POA: Diagnosis not present

## 2012-05-12 DIAGNOSIS — N183 Chronic kidney disease, stage 3 unspecified: Secondary | ICD-10-CM | POA: Diagnosis not present

## 2012-05-12 DIAGNOSIS — M169 Osteoarthritis of hip, unspecified: Secondary | ICD-10-CM | POA: Diagnosis not present

## 2012-05-12 DIAGNOSIS — M109 Gout, unspecified: Secondary | ICD-10-CM | POA: Diagnosis not present

## 2012-05-12 DIAGNOSIS — E785 Hyperlipidemia, unspecified: Secondary | ICD-10-CM | POA: Diagnosis not present

## 2012-05-12 DIAGNOSIS — Z7982 Long term (current) use of aspirin: Secondary | ICD-10-CM | POA: Diagnosis not present

## 2012-07-20 DIAGNOSIS — Z8669 Personal history of other diseases of the nervous system and sense organs: Secondary | ICD-10-CM | POA: Diagnosis not present

## 2012-07-20 DIAGNOSIS — N390 Urinary tract infection, site not specified: Secondary | ICD-10-CM | POA: Diagnosis not present

## 2012-07-20 DIAGNOSIS — R5381 Other malaise: Secondary | ICD-10-CM | POA: Diagnosis not present

## 2012-07-20 DIAGNOSIS — Z8546 Personal history of malignant neoplasm of prostate: Secondary | ICD-10-CM | POA: Diagnosis not present

## 2012-07-20 DIAGNOSIS — I10 Essential (primary) hypertension: Secondary | ICD-10-CM | POA: Diagnosis not present

## 2012-07-20 DIAGNOSIS — E291 Testicular hypofunction: Secondary | ICD-10-CM | POA: Diagnosis not present

## 2012-07-20 DIAGNOSIS — M109 Gout, unspecified: Secondary | ICD-10-CM | POA: Diagnosis not present

## 2012-07-20 DIAGNOSIS — Z8672 Personal history of thrombophlebitis: Secondary | ICD-10-CM | POA: Diagnosis not present

## 2012-08-01 DIAGNOSIS — N3020 Other chronic cystitis without hematuria: Secondary | ICD-10-CM | POA: Diagnosis not present

## 2012-08-01 DIAGNOSIS — C61 Malignant neoplasm of prostate: Secondary | ICD-10-CM | POA: Diagnosis not present

## 2012-08-01 DIAGNOSIS — N529 Male erectile dysfunction, unspecified: Secondary | ICD-10-CM | POA: Diagnosis not present

## 2012-09-08 DIAGNOSIS — Z87440 Personal history of urinary (tract) infections: Secondary | ICD-10-CM | POA: Diagnosis not present

## 2012-09-08 DIAGNOSIS — Z923 Personal history of irradiation: Secondary | ICD-10-CM | POA: Diagnosis not present

## 2012-09-08 DIAGNOSIS — C61 Malignant neoplasm of prostate: Secondary | ICD-10-CM | POA: Diagnosis not present

## 2012-11-15 DIAGNOSIS — M25569 Pain in unspecified knee: Secondary | ICD-10-CM | POA: Diagnosis not present

## 2012-11-15 DIAGNOSIS — M169 Osteoarthritis of hip, unspecified: Secondary | ICD-10-CM | POA: Diagnosis not present

## 2012-11-15 DIAGNOSIS — Z96649 Presence of unspecified artificial hip joint: Secondary | ICD-10-CM | POA: Diagnosis not present

## 2012-11-15 DIAGNOSIS — Z471 Aftercare following joint replacement surgery: Secondary | ICD-10-CM | POA: Diagnosis not present

## 2012-11-15 DIAGNOSIS — M1610 Unilateral primary osteoarthritis, unspecified hip: Secondary | ICD-10-CM | POA: Diagnosis not present

## 2012-11-15 DIAGNOSIS — Z96659 Presence of unspecified artificial knee joint: Secondary | ICD-10-CM | POA: Diagnosis not present

## 2012-11-28 DIAGNOSIS — C61 Malignant neoplasm of prostate: Secondary | ICD-10-CM | POA: Diagnosis not present

## 2012-12-05 DIAGNOSIS — N529 Male erectile dysfunction, unspecified: Secondary | ICD-10-CM | POA: Diagnosis not present

## 2012-12-05 DIAGNOSIS — N3020 Other chronic cystitis without hematuria: Secondary | ICD-10-CM | POA: Diagnosis not present

## 2012-12-05 DIAGNOSIS — C61 Malignant neoplasm of prostate: Secondary | ICD-10-CM | POA: Diagnosis not present

## 2012-12-21 DIAGNOSIS — M2550 Pain in unspecified joint: Secondary | ICD-10-CM | POA: Diagnosis not present

## 2012-12-21 DIAGNOSIS — Z79899 Other long term (current) drug therapy: Secondary | ICD-10-CM | POA: Diagnosis not present

## 2012-12-21 DIAGNOSIS — M1990 Unspecified osteoarthritis, unspecified site: Secondary | ICD-10-CM | POA: Diagnosis not present

## 2012-12-21 DIAGNOSIS — M109 Gout, unspecified: Secondary | ICD-10-CM | POA: Diagnosis not present

## 2013-01-05 DIAGNOSIS — N3020 Other chronic cystitis without hematuria: Secondary | ICD-10-CM | POA: Diagnosis not present

## 2013-01-16 DIAGNOSIS — I10 Essential (primary) hypertension: Secondary | ICD-10-CM | POA: Diagnosis not present

## 2013-01-16 DIAGNOSIS — Z125 Encounter for screening for malignant neoplasm of prostate: Secondary | ICD-10-CM | POA: Diagnosis not present

## 2013-01-16 DIAGNOSIS — N182 Chronic kidney disease, stage 2 (mild): Secondary | ICD-10-CM | POA: Diagnosis not present

## 2013-01-16 DIAGNOSIS — M109 Gout, unspecified: Secondary | ICD-10-CM | POA: Diagnosis not present

## 2013-01-23 DIAGNOSIS — H6120 Impacted cerumen, unspecified ear: Secondary | ICD-10-CM | POA: Diagnosis not present

## 2013-01-23 DIAGNOSIS — E291 Testicular hypofunction: Secondary | ICD-10-CM | POA: Diagnosis not present

## 2013-01-23 DIAGNOSIS — I10 Essential (primary) hypertension: Secondary | ICD-10-CM | POA: Diagnosis not present

## 2013-01-23 DIAGNOSIS — Z8546 Personal history of malignant neoplasm of prostate: Secondary | ICD-10-CM | POA: Diagnosis not present

## 2013-01-23 DIAGNOSIS — M109 Gout, unspecified: Secondary | ICD-10-CM | POA: Diagnosis not present

## 2013-01-23 DIAGNOSIS — N3020 Other chronic cystitis without hematuria: Secondary | ICD-10-CM | POA: Diagnosis not present

## 2013-01-23 DIAGNOSIS — N182 Chronic kidney disease, stage 2 (mild): Secondary | ICD-10-CM | POA: Diagnosis not present

## 2013-01-23 DIAGNOSIS — Z0000 Encounter for general adult medical examination without abnormal findings: Secondary | ICD-10-CM | POA: Diagnosis not present

## 2013-01-23 DIAGNOSIS — Z23 Encounter for immunization: Secondary | ICD-10-CM | POA: Diagnosis not present

## 2013-01-25 DIAGNOSIS — H6120 Impacted cerumen, unspecified ear: Secondary | ICD-10-CM | POA: Diagnosis not present

## 2013-02-02 ENCOUNTER — Telehealth: Payer: Self-pay

## 2013-02-02 NOTE — Telephone Encounter (Signed)
[facility] is requesting records on patient. They did not specify anything specific. Please call [phone]

## 2013-02-07 DIAGNOSIS — N3020 Other chronic cystitis without hematuria: Secondary | ICD-10-CM | POA: Diagnosis not present

## 2013-02-07 NOTE — Telephone Encounter (Signed)
Tried calling [facility] to see which records they needed and if they could send a fax requesting records. There was no answer. Patient last seen in 2011.

## 2013-04-03 DIAGNOSIS — R05 Cough: Secondary | ICD-10-CM | POA: Diagnosis not present

## 2013-04-03 DIAGNOSIS — Z6828 Body mass index (BMI) 28.0-28.9, adult: Secondary | ICD-10-CM | POA: Diagnosis not present

## 2013-04-03 DIAGNOSIS — R059 Cough, unspecified: Secondary | ICD-10-CM | POA: Diagnosis not present

## 2013-05-31 DIAGNOSIS — C61 Malignant neoplasm of prostate: Secondary | ICD-10-CM | POA: Diagnosis not present

## 2013-05-31 DIAGNOSIS — N3020 Other chronic cystitis without hematuria: Secondary | ICD-10-CM | POA: Diagnosis not present

## 2013-06-05 DIAGNOSIS — Z8546 Personal history of malignant neoplasm of prostate: Secondary | ICD-10-CM | POA: Diagnosis not present

## 2013-06-05 DIAGNOSIS — N3020 Other chronic cystitis without hematuria: Secondary | ICD-10-CM | POA: Diagnosis not present

## 2013-06-05 DIAGNOSIS — N529 Male erectile dysfunction, unspecified: Secondary | ICD-10-CM | POA: Diagnosis not present

## 2013-06-21 DIAGNOSIS — M1990 Unspecified osteoarthritis, unspecified site: Secondary | ICD-10-CM | POA: Diagnosis not present

## 2013-06-21 DIAGNOSIS — M2550 Pain in unspecified joint: Secondary | ICD-10-CM | POA: Diagnosis not present

## 2013-06-21 DIAGNOSIS — M109 Gout, unspecified: Secondary | ICD-10-CM | POA: Diagnosis not present

## 2013-06-21 DIAGNOSIS — Z79899 Other long term (current) drug therapy: Secondary | ICD-10-CM | POA: Diagnosis not present

## 2013-07-24 DIAGNOSIS — Z6829 Body mass index (BMI) 29.0-29.9, adult: Secondary | ICD-10-CM | POA: Diagnosis not present

## 2013-07-24 DIAGNOSIS — N182 Chronic kidney disease, stage 2 (mild): Secondary | ICD-10-CM | POA: Diagnosis not present

## 2013-07-24 DIAGNOSIS — M109 Gout, unspecified: Secondary | ICD-10-CM | POA: Diagnosis not present

## 2013-07-24 DIAGNOSIS — Z8546 Personal history of malignant neoplasm of prostate: Secondary | ICD-10-CM | POA: Diagnosis not present

## 2013-07-24 DIAGNOSIS — I10 Essential (primary) hypertension: Secondary | ICD-10-CM | POA: Diagnosis not present

## 2013-08-16 ENCOUNTER — Ambulatory Visit (HOSPITAL_COMMUNITY)
Admission: RE | Admit: 2013-08-16 | Discharge: 2013-08-16 | Disposition: A | Discharge status: Home / Self Care | Payer: Medicare Other | Source: Ambulatory Visit | Attending: Internal Medicine | Admitting: Internal Medicine

## 2013-08-16 ENCOUNTER — Other Ambulatory Visit (HOSPITAL_COMMUNITY): Payer: Self-pay | Admitting: Internal Medicine

## 2013-08-16 DIAGNOSIS — M7989 Other specified soft tissue disorders: Secondary | ICD-10-CM

## 2013-08-16 DIAGNOSIS — Z6830 Body mass index (BMI) 30.0-30.9, adult: Secondary | ICD-10-CM | POA: Diagnosis not present

## 2013-08-16 DIAGNOSIS — Z8672 Personal history of thrombophlebitis: Secondary | ICD-10-CM | POA: Diagnosis not present

## 2013-09-22 DIAGNOSIS — R319 Hematuria, unspecified: Secondary | ICD-10-CM | POA: Diagnosis not present

## 2013-10-20 ENCOUNTER — Ambulatory Visit: Payer: Self-pay

## 2013-11-15 DIAGNOSIS — Z96649 Presence of unspecified artificial hip joint: Secondary | ICD-10-CM | POA: Diagnosis not present

## 2013-11-15 DIAGNOSIS — N189 Chronic kidney disease, unspecified: Secondary | ICD-10-CM | POA: Diagnosis not present

## 2013-11-15 DIAGNOSIS — Z471 Aftercare following joint replacement surgery: Secondary | ICD-10-CM | POA: Diagnosis not present

## 2013-11-15 DIAGNOSIS — I129 Hypertensive chronic kidney disease with stage 1 through stage 4 chronic kidney disease, or unspecified chronic kidney disease: Secondary | ICD-10-CM | POA: Diagnosis not present

## 2013-11-21 ENCOUNTER — Ambulatory Visit: Payer: Self-pay

## 2013-12-11 DIAGNOSIS — N529 Male erectile dysfunction, unspecified: Secondary | ICD-10-CM | POA: Diagnosis not present

## 2013-12-11 DIAGNOSIS — N3020 Other chronic cystitis without hematuria: Secondary | ICD-10-CM | POA: Diagnosis not present

## 2013-12-19 DIAGNOSIS — M109 Gout, unspecified: Secondary | ICD-10-CM | POA: Diagnosis not present

## 2013-12-19 DIAGNOSIS — M1990 Unspecified osteoarthritis, unspecified site: Secondary | ICD-10-CM | POA: Diagnosis not present

## 2013-12-19 DIAGNOSIS — Z79899 Other long term (current) drug therapy: Secondary | ICD-10-CM | POA: Diagnosis not present

## 2013-12-19 DIAGNOSIS — M2550 Pain in unspecified joint: Secondary | ICD-10-CM | POA: Diagnosis not present

## 2014-01-15 DIAGNOSIS — I10 Essential (primary) hypertension: Secondary | ICD-10-CM | POA: Diagnosis not present

## 2014-01-15 DIAGNOSIS — E785 Hyperlipidemia, unspecified: Secondary | ICD-10-CM | POA: Diagnosis not present

## 2014-01-15 DIAGNOSIS — Z125 Encounter for screening for malignant neoplasm of prostate: Secondary | ICD-10-CM | POA: Diagnosis not present

## 2014-01-15 DIAGNOSIS — M109 Gout, unspecified: Secondary | ICD-10-CM | POA: Diagnosis not present

## 2014-01-24 DIAGNOSIS — I10 Essential (primary) hypertension: Secondary | ICD-10-CM | POA: Diagnosis not present

## 2014-01-24 DIAGNOSIS — Z0000 Encounter for general adult medical examination without abnormal findings: Secondary | ICD-10-CM | POA: Diagnosis not present

## 2014-01-24 DIAGNOSIS — R5381 Other malaise: Secondary | ICD-10-CM | POA: Diagnosis not present

## 2014-01-24 DIAGNOSIS — E291 Testicular hypofunction: Secondary | ICD-10-CM | POA: Diagnosis not present

## 2014-01-24 DIAGNOSIS — Z23 Encounter for immunization: Secondary | ICD-10-CM | POA: Diagnosis not present

## 2014-01-24 DIAGNOSIS — N3020 Other chronic cystitis without hematuria: Secondary | ICD-10-CM | POA: Diagnosis not present

## 2014-01-24 DIAGNOSIS — Z1331 Encounter for screening for depression: Secondary | ICD-10-CM | POA: Diagnosis not present

## 2014-01-24 DIAGNOSIS — M1990 Unspecified osteoarthritis, unspecified site: Secondary | ICD-10-CM | POA: Diagnosis not present

## 2014-01-24 DIAGNOSIS — N183 Chronic kidney disease, stage 3 unspecified: Secondary | ICD-10-CM | POA: Diagnosis not present

## 2014-01-24 DIAGNOSIS — R319 Hematuria, unspecified: Secondary | ICD-10-CM | POA: Diagnosis not present

## 2014-01-31 DIAGNOSIS — H6120 Impacted cerumen, unspecified ear: Secondary | ICD-10-CM | POA: Diagnosis not present

## 2014-02-14 IMAGING — CR DG FOOT COMPLETE 3+V*L*
3 series · 3 of 3 positions shown · non-contrast
Comparison: None.

CLINICAL DATA: Pain and swelling in both feet.  No known injury.
History of gout.

LEFT FOOT - COMPLETE 3+ VIEW

[x foot ap left]
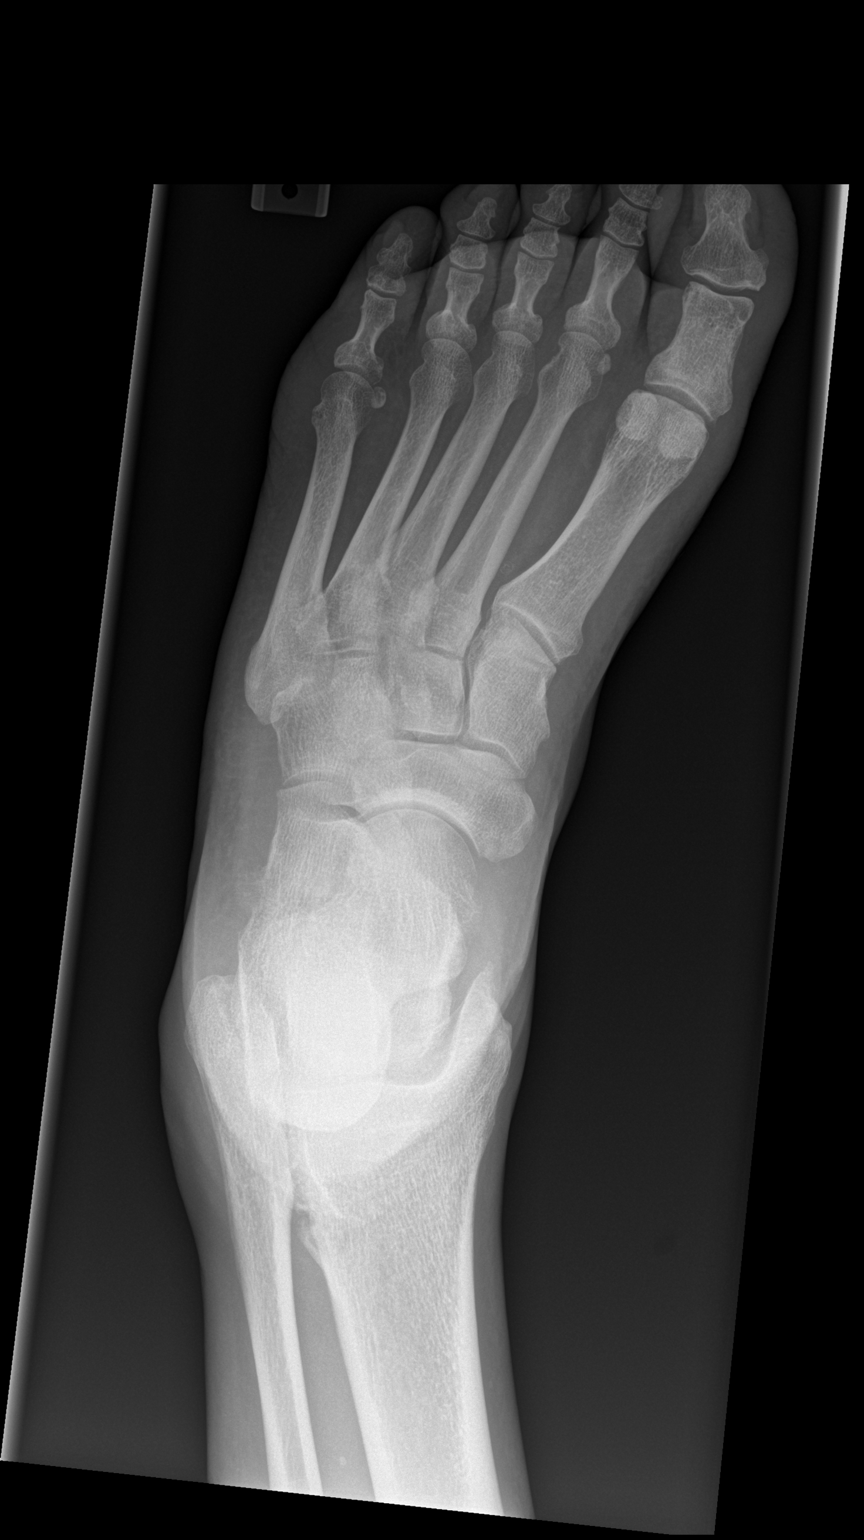

[x foot obl left]
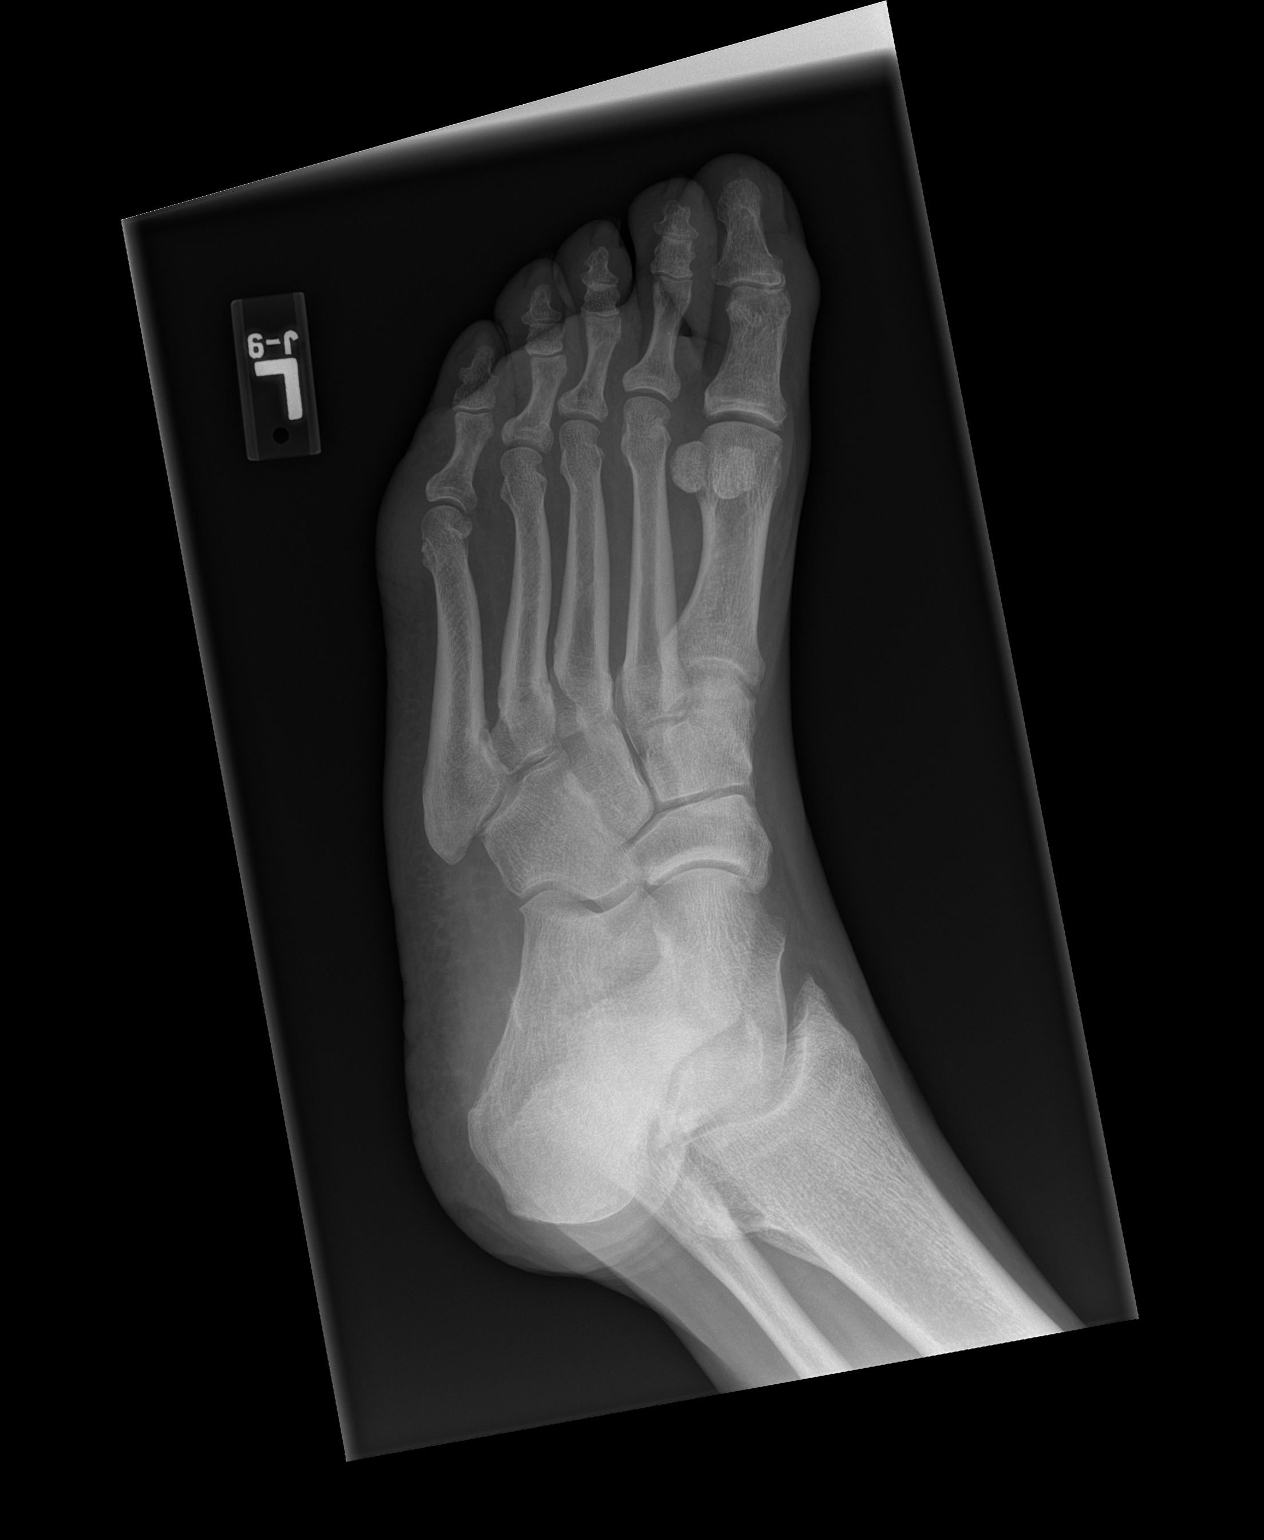

[x foot lat left]
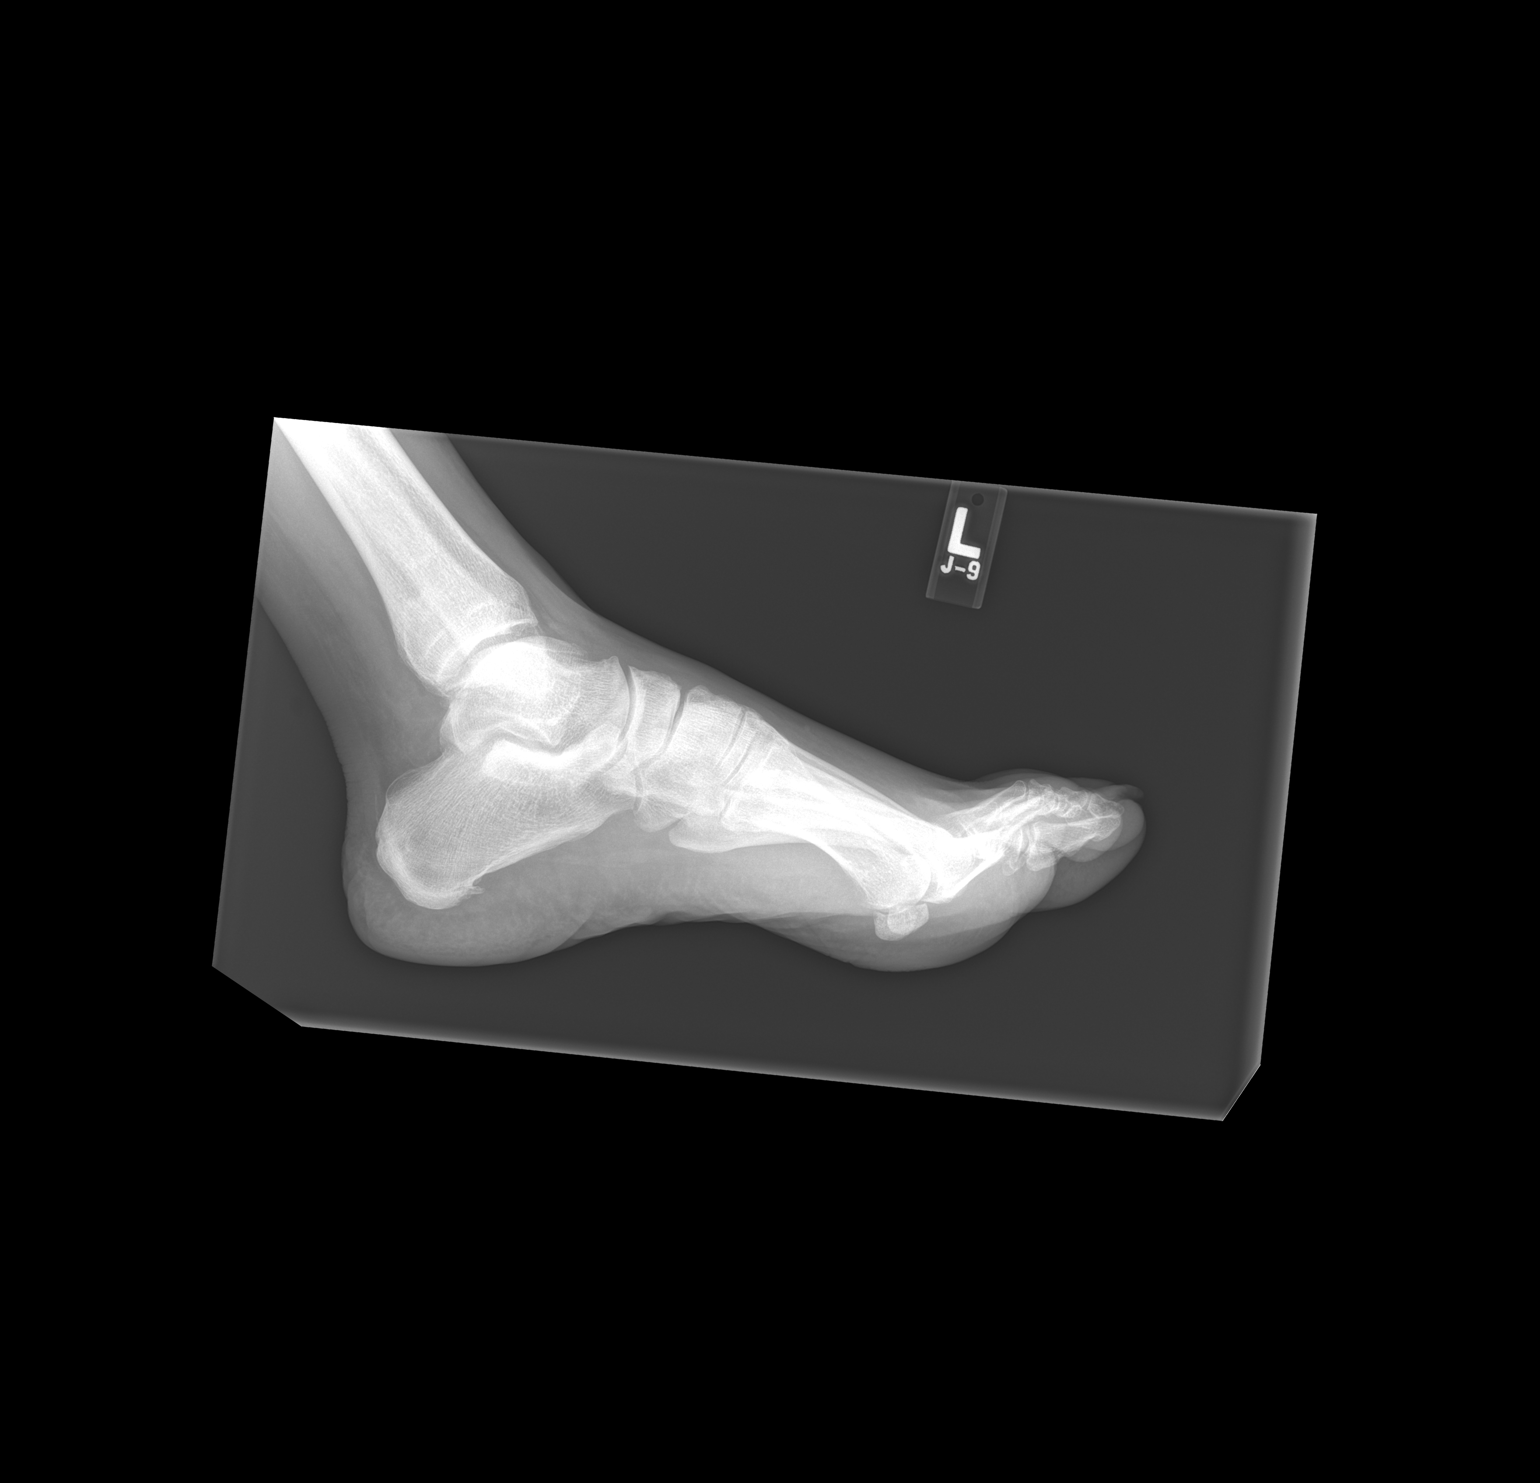

[3 of 3 positions shown; findings below may reference images not displayed]

FINDINGS: Mild soft tissue swelling over the fifth metatarsal-
phalangeal joint.  No focal bone erosions demonstrated.  No
evidence of acute fracture or subluxation.  Degenerative changes in
the ankle.
IMPRESSION: No acute bony abnormalities.  No bony changes of gout identified.

## 2014-02-14 IMAGING — CR DG FOOT COMPLETE 3+V*R*
3 series · 3 of 3 positions shown · non-contrast
Comparison: None.

CLINICAL DATA: Pain and swelling of the foot.  No known injury.
History of gout.

RIGHT FOOT COMPLETE - 3+ VIEW

[x foot ap right]
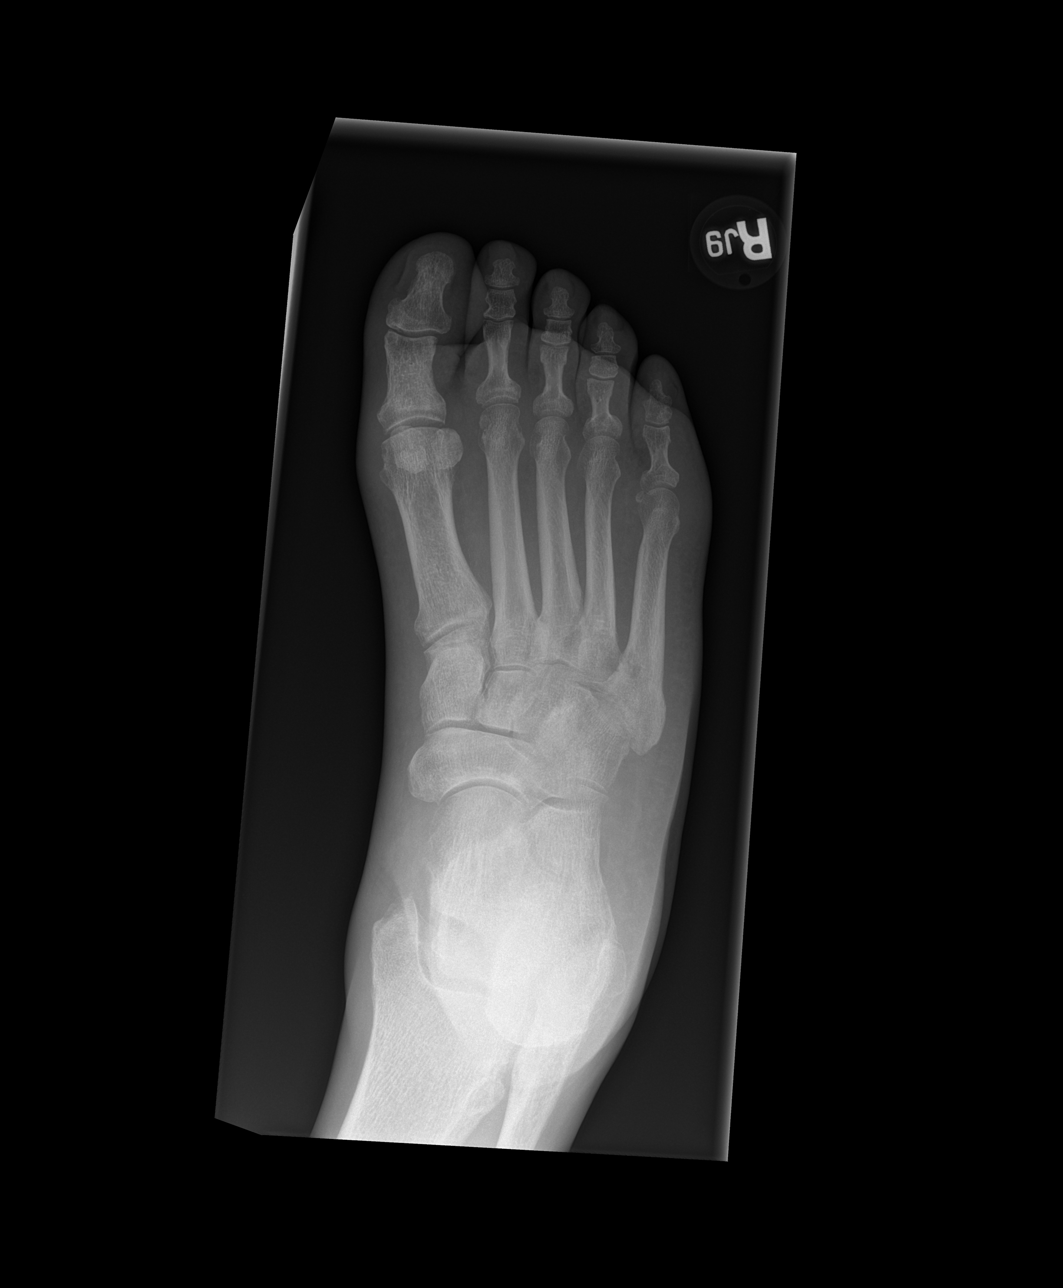

[x foot obl right]
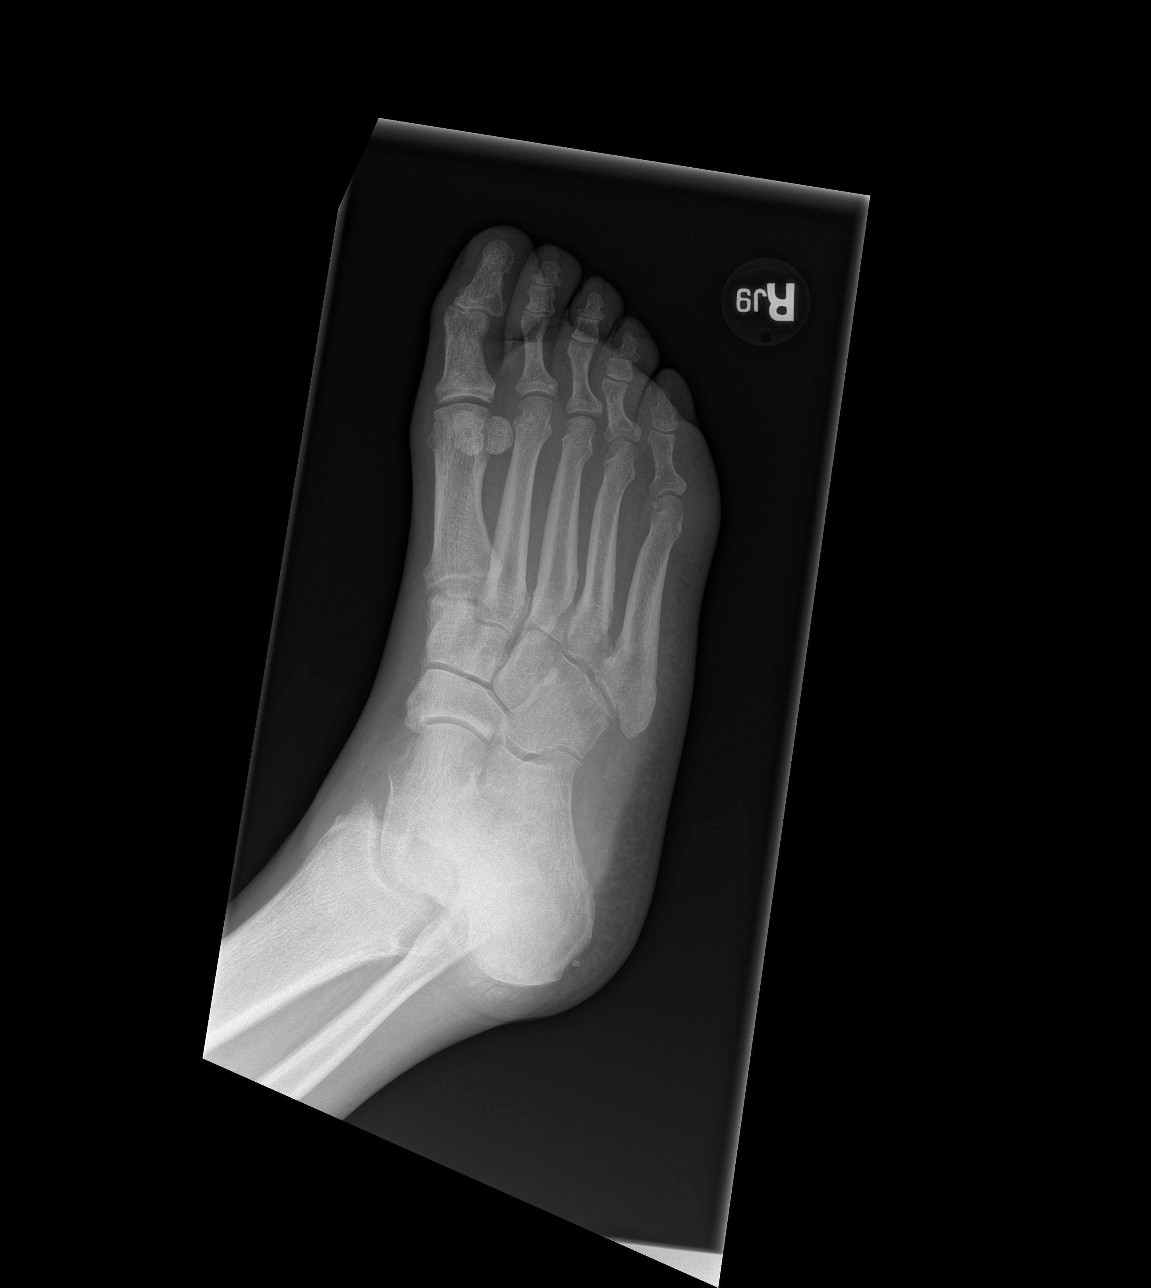

[x foot lat right]
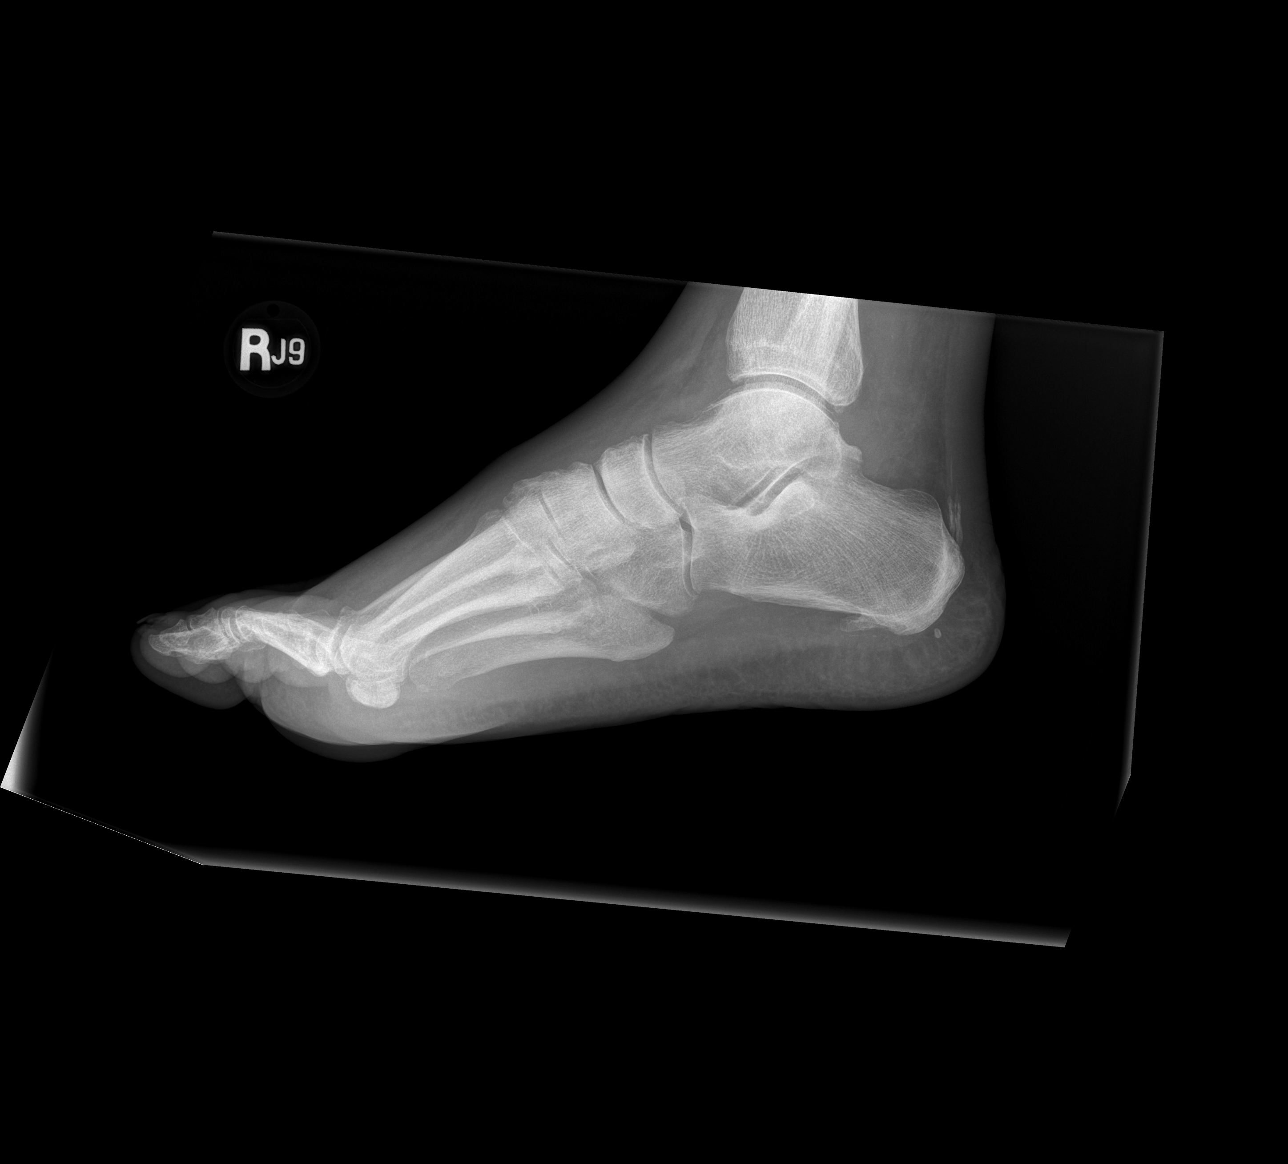

[3 of 3 positions shown; findings below may reference images not displayed]

FINDINGS: The there is minimal erosion of the medial aspect of the
first metatarsal head which might represent early changes of gout.
No other focal erosive changes identified.  No evidence of acute
fracture or subluxation.  Plantar and Achilles calcaneal spurs.
Calcification in the distal Achilles tendon suggest calcific
tendonitis.
IMPRESSION: Minimal focal erosion of the medial aspect of the right first
metatarsal head suggest early changes of gout.  Calcification in
the Achilles tendon may represent calcific tendonitis.

## 2014-06-06 DIAGNOSIS — Z8546 Personal history of malignant neoplasm of prostate: Secondary | ICD-10-CM | POA: Diagnosis not present

## 2014-06-13 DIAGNOSIS — N3020 Other chronic cystitis without hematuria: Secondary | ICD-10-CM | POA: Diagnosis not present

## 2014-06-13 DIAGNOSIS — N5201 Erectile dysfunction due to arterial insufficiency: Secondary | ICD-10-CM | POA: Diagnosis not present

## 2014-06-13 DIAGNOSIS — Z8546 Personal history of malignant neoplasm of prostate: Secondary | ICD-10-CM | POA: Diagnosis not present

## 2014-06-21 DIAGNOSIS — M2550 Pain in unspecified joint: Secondary | ICD-10-CM | POA: Diagnosis not present

## 2014-06-21 DIAGNOSIS — M150 Primary generalized (osteo)arthritis: Secondary | ICD-10-CM | POA: Diagnosis not present

## 2014-06-21 DIAGNOSIS — Z79899 Other long term (current) drug therapy: Secondary | ICD-10-CM | POA: Diagnosis not present

## 2014-06-21 DIAGNOSIS — M1009 Idiopathic gout, multiple sites: Secondary | ICD-10-CM | POA: Diagnosis not present

## 2015-01-23 DIAGNOSIS — Z79899 Other long term (current) drug therapy: Secondary | ICD-10-CM | POA: Diagnosis not present

## 2015-01-23 DIAGNOSIS — M25562 Pain in left knee: Secondary | ICD-10-CM | POA: Diagnosis not present

## 2015-01-23 DIAGNOSIS — M1009 Idiopathic gout, multiple sites: Secondary | ICD-10-CM | POA: Diagnosis not present

## 2015-01-23 DIAGNOSIS — M25561 Pain in right knee: Secondary | ICD-10-CM | POA: Diagnosis not present

## 2015-01-23 DIAGNOSIS — M159 Polyosteoarthritis, unspecified: Secondary | ICD-10-CM | POA: Diagnosis not present

## 2015-02-05 DIAGNOSIS — I10 Essential (primary) hypertension: Secondary | ICD-10-CM | POA: Diagnosis not present

## 2015-02-05 DIAGNOSIS — Z125 Encounter for screening for malignant neoplasm of prostate: Secondary | ICD-10-CM | POA: Diagnosis not present

## 2015-02-05 DIAGNOSIS — M109 Gout, unspecified: Secondary | ICD-10-CM | POA: Diagnosis not present

## 2015-02-12 DIAGNOSIS — M1990 Unspecified osteoarthritis, unspecified site: Secondary | ICD-10-CM | POA: Diagnosis not present

## 2015-02-12 DIAGNOSIS — E291 Testicular hypofunction: Secondary | ICD-10-CM | POA: Diagnosis not present

## 2015-02-12 DIAGNOSIS — Z6830 Body mass index (BMI) 30.0-30.9, adult: Secondary | ICD-10-CM | POA: Diagnosis not present

## 2015-02-12 DIAGNOSIS — Z23 Encounter for immunization: Secondary | ICD-10-CM | POA: Diagnosis not present

## 2015-02-12 DIAGNOSIS — Z1389 Encounter for screening for other disorder: Secondary | ICD-10-CM | POA: Diagnosis not present

## 2015-02-12 DIAGNOSIS — Z0000 Encounter for general adult medical examination without abnormal findings: Secondary | ICD-10-CM | POA: Diagnosis not present

## 2015-02-12 DIAGNOSIS — I10 Essential (primary) hypertension: Secondary | ICD-10-CM | POA: Diagnosis not present

## 2015-02-12 DIAGNOSIS — F5221 Male erectile disorder: Secondary | ICD-10-CM | POA: Diagnosis not present

## 2015-02-12 DIAGNOSIS — N3020 Other chronic cystitis without hematuria: Secondary | ICD-10-CM | POA: Diagnosis not present

## 2015-02-12 DIAGNOSIS — Z8546 Personal history of malignant neoplasm of prostate: Secondary | ICD-10-CM | POA: Diagnosis not present

## 2015-02-12 DIAGNOSIS — N183 Chronic kidney disease, stage 3 (moderate): Secondary | ICD-10-CM | POA: Diagnosis not present

## 2015-02-12 DIAGNOSIS — I129 Hypertensive chronic kidney disease with stage 1 through stage 4 chronic kidney disease, or unspecified chronic kidney disease: Secondary | ICD-10-CM | POA: Diagnosis not present

## 2015-07-08 DIAGNOSIS — Z8546 Personal history of malignant neoplasm of prostate: Secondary | ICD-10-CM | POA: Diagnosis not present

## 2015-08-12 DIAGNOSIS — Z79899 Other long term (current) drug therapy: Secondary | ICD-10-CM | POA: Diagnosis not present

## 2015-08-12 DIAGNOSIS — M1009 Idiopathic gout, multiple sites: Secondary | ICD-10-CM | POA: Diagnosis not present

## 2015-08-12 DIAGNOSIS — M159 Polyosteoarthritis, unspecified: Secondary | ICD-10-CM | POA: Diagnosis not present

## 2015-08-14 DIAGNOSIS — Z8546 Personal history of malignant neoplasm of prostate: Secondary | ICD-10-CM | POA: Diagnosis not present

## 2015-08-21 DIAGNOSIS — N3020 Other chronic cystitis without hematuria: Secondary | ICD-10-CM | POA: Diagnosis not present

## 2015-08-21 DIAGNOSIS — N5201 Erectile dysfunction due to arterial insufficiency: Secondary | ICD-10-CM | POA: Diagnosis not present

## 2015-08-21 DIAGNOSIS — Z8546 Personal history of malignant neoplasm of prostate: Secondary | ICD-10-CM | POA: Diagnosis not present

## 2016-02-19 DIAGNOSIS — Z125 Encounter for screening for malignant neoplasm of prostate: Secondary | ICD-10-CM | POA: Diagnosis not present

## 2016-02-19 DIAGNOSIS — E781 Pure hyperglyceridemia: Secondary | ICD-10-CM | POA: Diagnosis not present

## 2016-02-19 DIAGNOSIS — M109 Gout, unspecified: Secondary | ICD-10-CM | POA: Diagnosis not present

## 2016-02-19 DIAGNOSIS — I10 Essential (primary) hypertension: Secondary | ICD-10-CM | POA: Diagnosis not present

## 2016-03-04 DIAGNOSIS — M109 Gout, unspecified: Secondary | ICD-10-CM | POA: Diagnosis not present

## 2016-03-04 DIAGNOSIS — E875 Hyperkalemia: Secondary | ICD-10-CM | POA: Diagnosis not present

## 2016-03-04 DIAGNOSIS — Z8546 Personal history of malignant neoplasm of prostate: Secondary | ICD-10-CM | POA: Diagnosis not present

## 2016-03-04 DIAGNOSIS — Z8672 Personal history of thrombophlebitis: Secondary | ICD-10-CM | POA: Diagnosis not present

## 2016-03-04 DIAGNOSIS — Z1389 Encounter for screening for other disorder: Secondary | ICD-10-CM | POA: Diagnosis not present

## 2016-03-04 DIAGNOSIS — Z0000 Encounter for general adult medical examination without abnormal findings: Secondary | ICD-10-CM | POA: Diagnosis not present

## 2016-03-04 DIAGNOSIS — Z23 Encounter for immunization: Secondary | ICD-10-CM | POA: Diagnosis not present

## 2016-03-04 DIAGNOSIS — I10 Essential (primary) hypertension: Secondary | ICD-10-CM | POA: Diagnosis not present

## 2016-03-04 DIAGNOSIS — E291 Testicular hypofunction: Secondary | ICD-10-CM | POA: Diagnosis not present

## 2016-03-17 DIAGNOSIS — Z09 Encounter for follow-up examination after completed treatment for conditions other than malignant neoplasm: Secondary | ICD-10-CM | POA: Diagnosis not present

## 2016-03-17 DIAGNOSIS — E162 Hypoglycemia, unspecified: Secondary | ICD-10-CM | POA: Diagnosis not present

## 2016-03-17 DIAGNOSIS — E1142 Type 2 diabetes mellitus with diabetic polyneuropathy: Secondary | ICD-10-CM | POA: Diagnosis not present

## 2016-07-20 DIAGNOSIS — R8299 Other abnormal findings in urine: Secondary | ICD-10-CM | POA: Diagnosis not present

## 2016-07-21 DIAGNOSIS — Z8546 Personal history of malignant neoplasm of prostate: Secondary | ICD-10-CM | POA: Diagnosis not present

## 2016-07-21 DIAGNOSIS — N3020 Other chronic cystitis without hematuria: Secondary | ICD-10-CM | POA: Diagnosis not present

## 2016-07-21 DIAGNOSIS — R310 Gross hematuria: Secondary | ICD-10-CM | POA: Diagnosis not present

## 2016-08-11 DIAGNOSIS — R319 Hematuria, unspecified: Secondary | ICD-10-CM | POA: Diagnosis not present

## 2016-08-11 DIAGNOSIS — R310 Gross hematuria: Secondary | ICD-10-CM | POA: Diagnosis not present

## 2016-08-11 DIAGNOSIS — Z8546 Personal history of malignant neoplasm of prostate: Secondary | ICD-10-CM | POA: Diagnosis not present

## 2016-08-19 DIAGNOSIS — Z8546 Personal history of malignant neoplasm of prostate: Secondary | ICD-10-CM | POA: Diagnosis not present

## 2016-08-19 DIAGNOSIS — N3041 Irradiation cystitis with hematuria: Secondary | ICD-10-CM | POA: Diagnosis not present

## 2017-03-09 DIAGNOSIS — Z8546 Personal history of malignant neoplasm of prostate: Secondary | ICD-10-CM | POA: Diagnosis not present

## 2017-03-09 DIAGNOSIS — Z23 Encounter for immunization: Secondary | ICD-10-CM | POA: Diagnosis not present

## 2017-03-09 DIAGNOSIS — E781 Pure hyperglyceridemia: Secondary | ICD-10-CM | POA: Diagnosis not present

## 2017-03-09 DIAGNOSIS — N3020 Other chronic cystitis without hematuria: Secondary | ICD-10-CM | POA: Diagnosis not present

## 2017-03-09 DIAGNOSIS — M109 Gout, unspecified: Secondary | ICD-10-CM | POA: Diagnosis not present

## 2017-03-09 DIAGNOSIS — I10 Essential (primary) hypertension: Secondary | ICD-10-CM | POA: Diagnosis not present

## 2017-03-09 DIAGNOSIS — E298 Other testicular dysfunction: Secondary | ICD-10-CM | POA: Diagnosis not present

## 2017-03-09 DIAGNOSIS — Z8672 Personal history of thrombophlebitis: Secondary | ICD-10-CM | POA: Diagnosis not present

## 2017-03-09 DIAGNOSIS — Z6829 Body mass index (BMI) 29.0-29.9, adult: Secondary | ICD-10-CM | POA: Diagnosis not present

## 2017-03-09 DIAGNOSIS — Z0000 Encounter for general adult medical examination without abnormal findings: Secondary | ICD-10-CM | POA: Diagnosis not present

## 2017-03-09 DIAGNOSIS — N183 Chronic kidney disease, stage 3 (moderate): Secondary | ICD-10-CM | POA: Diagnosis not present

## 2017-03-09 DIAGNOSIS — Z1389 Encounter for screening for other disorder: Secondary | ICD-10-CM | POA: Diagnosis not present

## 2017-04-08 ENCOUNTER — Encounter: Payer: Self-pay | Admitting: Internal Medicine

## 2017-04-29 DIAGNOSIS — M11261 Other chondrocalcinosis, right knee: Secondary | ICD-10-CM | POA: Diagnosis not present

## 2017-04-29 DIAGNOSIS — Z96652 Presence of left artificial knee joint: Secondary | ICD-10-CM | POA: Diagnosis not present

## 2017-04-29 DIAGNOSIS — Z471 Aftercare following joint replacement surgery: Secondary | ICD-10-CM | POA: Diagnosis not present

## 2017-04-29 DIAGNOSIS — T84038A Mechanical loosening of other internal prosthetic joint, initial encounter: Secondary | ICD-10-CM | POA: Diagnosis not present

## 2017-04-29 DIAGNOSIS — M1612 Unilateral primary osteoarthritis, left hip: Secondary | ICD-10-CM | POA: Diagnosis not present

## 2017-04-29 DIAGNOSIS — M25561 Pain in right knee: Secondary | ICD-10-CM | POA: Diagnosis not present

## 2017-04-29 DIAGNOSIS — Z96641 Presence of right artificial hip joint: Secondary | ICD-10-CM | POA: Diagnosis not present

## 2017-04-29 DIAGNOSIS — T84033A Mechanical loosening of internal left knee prosthetic joint, initial encounter: Secondary | ICD-10-CM | POA: Diagnosis not present

## 2017-04-29 DIAGNOSIS — M1711 Unilateral primary osteoarthritis, right knee: Secondary | ICD-10-CM | POA: Diagnosis not present

## 2018-04-07 DIAGNOSIS — Z6829 Body mass index (BMI) 29.0-29.9, adult: Secondary | ICD-10-CM | POA: Diagnosis not present

## 2018-04-07 DIAGNOSIS — L259 Unspecified contact dermatitis, unspecified cause: Secondary | ICD-10-CM | POA: Diagnosis not present

## 2018-04-07 DIAGNOSIS — L03114 Cellulitis of left upper limb: Secondary | ICD-10-CM | POA: Diagnosis not present

## 2018-05-24 DIAGNOSIS — M25562 Pain in left knee: Secondary | ICD-10-CM | POA: Diagnosis not present

## 2018-05-24 DIAGNOSIS — M170 Bilateral primary osteoarthritis of knee: Secondary | ICD-10-CM | POA: Diagnosis not present

## 2018-05-24 DIAGNOSIS — M179 Osteoarthritis of knee, unspecified: Secondary | ICD-10-CM | POA: Diagnosis not present

## 2018-05-24 DIAGNOSIS — M25561 Pain in right knee: Secondary | ICD-10-CM | POA: Diagnosis not present

## 2018-06-03 DIAGNOSIS — T84033D Mechanical loosening of internal left knee prosthetic joint, subsequent encounter: Secondary | ICD-10-CM | POA: Diagnosis not present

## 2018-06-03 DIAGNOSIS — M25562 Pain in left knee: Secondary | ICD-10-CM | POA: Diagnosis not present

## 2018-06-09 DIAGNOSIS — Z8672 Personal history of thrombophlebitis: Secondary | ICD-10-CM | POA: Diagnosis not present

## 2018-06-09 DIAGNOSIS — N183 Chronic kidney disease, stage 3 (moderate): Secondary | ICD-10-CM | POA: Diagnosis not present

## 2018-06-09 DIAGNOSIS — R7309 Other abnormal glucose: Secondary | ICD-10-CM | POA: Diagnosis not present

## 2018-06-09 DIAGNOSIS — Z6829 Body mass index (BMI) 29.0-29.9, adult: Secondary | ICD-10-CM | POA: Diagnosis not present

## 2018-06-09 DIAGNOSIS — M25562 Pain in left knee: Secondary | ICD-10-CM | POA: Diagnosis not present

## 2018-06-09 DIAGNOSIS — E781 Pure hyperglyceridemia: Secondary | ICD-10-CM | POA: Diagnosis not present

## 2018-06-09 DIAGNOSIS — I10 Essential (primary) hypertension: Secondary | ICD-10-CM | POA: Diagnosis not present

## 2018-06-09 DIAGNOSIS — N390 Urinary tract infection, site not specified: Secondary | ICD-10-CM | POA: Diagnosis not present

## 2018-06-19 ENCOUNTER — Ambulatory Visit: Payer: Self-pay | Admitting: Orthopedic Surgery

## 2018-06-28 ENCOUNTER — Ambulatory Visit: Payer: Self-pay | Admitting: Orthopedic Surgery

## 2018-06-28 NOTE — H&P (View-Only) (Signed)
TOTAL KNEE REVISION ADMISSION H&P  Patient is being admitted for left revision total knee arthroplasty.  Subjective:  Chief Complaint:left knee pain.  HPI: Douglas Hoffman, 73 y.o. male, has a history of pain and functional disability in the left knee(s) due to failed previous arthroplasty and patient has failed non-surgical conservative treatments for greater than 12 weeks to include NSAID's and/or analgesics, flexibility and strengthening excercises, use of assistive devices, weight reduction as appropriate and activity modification. The indications for the revision of the total knee arthroplasty are loosening of one or more components and progressive or substantial perporsthetic bone loss. Onset of symptoms was gradual starting 5 years ago with gradually worsening course since that time.  Prior procedures on the left knee(s) include arthroplasty.  Patient currently rates pain in the left knee(s) at 10 out of 10 with activity. There is night pain, worsening of pain with activity and weight bearing, pain that interferes with activities of daily living, pain with passive range of motion, crepitus and joint swelling.  Patient has evidence of prosthetic loosening by imaging studies. This condition presents safety issues increasing the risk of falls.  There is no current active infection.  Patient Active Problem List   Diagnosis Date Noted  . Hypertrophy of prostate with urinary obstruction and other lower urinary tract symptoms (LUTS) 03/23/2012  . Chronic Cystitis 03/23/2012  . Decreased libido 03/23/2012  . Impotence of organic origin 03/23/2012  . Malignant neoplasm of prostate ([place]) 03/23/2012  . Elevated prostate specific antigen (PSA) 03/23/2012  . Gout attack 10/30/2011  . CKD (chronic kidney disease) 10/30/2011  . HTN (hypertension) 10/30/2011  . DVT (deep venous thrombosis) ([place]) 10/30/2011   Past Medical History:  Diagnosis Date  . Acute venous embolism and thrombosis of unspecified  deep vessels of lower extremity   . Cancer ([place])   . Glaucoma    left eye since age 66  . Gout   . Hip pain   . History of radiation therapy    Prostate  . Hypertension   . Prostate cancer ([place])   . Renal disorder    reports prior to joint replacement surgery , was taking 5 to 6 aleve daily for pain releif, report shas stopped this and kidney function has omporved, kidney fx followed by his PCP       Past Surgical History:  Procedure Laterality Date  . JOINT REPLACEMENT     RIGHT Hip Replacement  . JOINT REPLACEMENT     Knee Replacement  . SUPRAPUBIC PROSTATECTOMY      No current facility-administered medications for this visit.    No current outpatient medications on file.   Facility-Administered Medications Ordered in Other Visits  Medication Dose Route Frequency Provider Last Rate Last Dose  . 0.9 %  sodium chloride infusion   Intravenous Continuous Rod Can, MD 125 mL/hr at 07/07/18 1048    . acetaminophen (OFIRMEV) IV 1,000 mg  1,000 mg Intravenous To OR Aragorn Recker, Aaron Edelman, MD      . ceFAZolin (ANCEF) IVPB 2g/100 mL premix  2 g Intravenous On Call to [place], MD      . chlorhexidine (HIBICLENS) 4 % liquid 4 application  60 mL Topical Once Hulbert Branscome, Aaron Edelman, MD      . fentaNYL (SUBLIMAZE) injection 50-100 mcg  50-100 mcg Intravenous Cheri Rous, MD   50 mcg at 07/07/18 1408  . lactated ringers infusion   Intravenous Continuous Nolon Nations, MD      . midazolam (VERSED) injection 1-2  mg  1-2 mg Intravenous Cheri Rous, MD      . povidone-iodine 10 % swab 2 application  2 application Topical Once Jetson Pickrel, Aaron Edelman, MD      . ropivacaine (PF) 5 mg/mL (0.5%) (NAROPIN) injection    Anesthesia Intra-op Ellender, Karyl Kinnier, MD   30 mL at 07/07/18 1410  . tranexamic acid (CYKLOKAPRON) IVPB 1,000 mg  1,000 mg Intravenous To OR Mete Purdum, Aaron Edelman, MD       No Known Allergies  Social History   Tobacco Use  . Smoking status: Never Smoker  . Smokeless tobacco:  Never Used  Substance Use Topics  . Alcohol use: No    No family history on file.    Review of Systems  Constitutional: Negative.   HENT: Negative.   Eyes: Negative.   Respiratory: Negative.   Cardiovascular: Negative.   Gastrointestinal: Negative.   Genitourinary: Negative.   Musculoskeletal: Positive for joint pain.  Skin: Negative.   Neurological: Negative.   Endo/Heme/Allergies: Negative.   Psychiatric/Behavioral: Negative.      Objective:  Physical Exam  Vitals reviewed. Constitutional: He is oriented to person, place, and time. He appears well-developed and well-nourished.  HENT:  Head: Normocephalic and atraumatic.  Eyes: Pupils are equal, round, and reactive to light. Conjunctivae and EOM are normal.  Neck: Normal range of motion. Neck supple.  Cardiovascular: Normal rate, regular rhythm and intact distal pulses.  Respiratory: Effort normal. No respiratory distress.  GI: Soft. He exhibits no distension.  Genitourinary:    Genitourinary Comments: deferred   Musculoskeletal:     Left knee: He exhibits decreased range of motion, swelling and abnormal alignment. Tenderness found. Medial joint line and lateral joint line tenderness noted.       Legs:  Neurological: He is alert and oriented to person, place, and time. He has normal reflexes.  Skin: Skin is warm and dry.  Psychiatric: He has a normal mood and affect. His behavior is normal. Judgment and thought content normal.    Vital signs in last 24 hours: @VSRANGES @  Labs:  Estimated body mass index is 27.67 kg/m as calculated from the following:   Height as of 07/07/18: 6' (1.829 m).   Weight as of 07/07/18: 92.5 kg.  Imaging Review Plain radiographs demonstrate severe degenerative joint disease of the left knee(s). The overall alignment is neutral.There is evidence of loosening of the femoral components. The bone quality appears to be poor for age and reported activity level. There is significant femoral bone  loss.    Assessment/Plan:  End stage arthritis, left knee(s) with failed previous arthroplasty.   The patient history, physical examination, clinical judgment of the provider and imaging studies are consistent with end stage degenerative joint disease of the left knee(s), previous total knee arthroplasty. Revision total knee arthroplasty is deemed medically necessary. The treatment options including medical management, injection therapy, arthroscopy and revision arthroplasty were discussed at length. The risks and benefits of revision total knee arthroplasty were presented and reviewed. The risks due to aseptic loosening, infection, stiffness, patella tracking problems, thromboembolic complications and other imponderables were discussed. The patient acknowledged the explanation, agreed to proceed with the plan and consent was signed. Patient is being admitted for inpatient treatment for surgery, pain control, PT, OT, prophylactic antibiotics, VTE prophylaxis, progressive ambulation and ADL's and discharge planning.The patient is planning to be discharged home with OPPT

## 2018-06-28 NOTE — H&P (Signed)
TOTAL KNEE REVISION ADMISSION H&P  Patient is being admitted for left revision total knee arthroplasty.  Subjective:  Chief Complaint:left knee pain.  HPI: Douglas Hoffman, 73 y.o. male, has a history of pain and functional disability in the left knee(s) due to failed previous arthroplasty and patient has failed non-surgical conservative treatments for greater than 12 weeks to include NSAID's and/or analgesics, flexibility and strengthening excercises, use of assistive devices, weight reduction as appropriate and activity modification. The indications for the revision of the total knee arthroplasty are loosening of one or more components and progressive or substantial perporsthetic bone loss. Onset of symptoms was gradual starting 5 years ago with gradually worsening course since that time.  Prior procedures on the left knee(s) include arthroplasty.  Patient currently rates pain in the left knee(s) at 10 out of 10 with activity. There is night pain, worsening of pain with activity and weight bearing, pain that interferes with activities of daily living, pain with passive range of motion, crepitus and joint swelling.  Patient has evidence of prosthetic loosening by imaging studies. This condition presents safety issues increasing the risk of falls.  There is no current active infection.  Patient Active Problem List   Diagnosis Date Noted  . Hypertrophy of prostate with urinary obstruction and other lower urinary tract symptoms (LUTS) 03/23/2012  . Chronic Cystitis 03/23/2012  . Decreased libido 03/23/2012  . Impotence of organic origin 03/23/2012  . Malignant neoplasm of prostate ([place]) 03/23/2012  . Elevated prostate specific antigen (PSA) 03/23/2012  . Gout attack 10/30/2011  . CKD (chronic kidney disease) 10/30/2011  . HTN (hypertension) 10/30/2011  . DVT (deep venous thrombosis) ([place]) 10/30/2011   Past Medical History:  Diagnosis Date  . Acute venous embolism and thrombosis of unspecified  deep vessels of lower extremity   . Cancer ([place])   . Glaucoma    left eye since age 50  . Gout   . Hip pain   . History of radiation therapy    Prostate  . Hypertension   . Prostate cancer ([place])   . Renal disorder    reports prior to joint replacement surgery , was taking 5 to 6 aleve daily for pain releif, report shas stopped this and kidney function has omporved, kidney fx followed by his PCP       Past Surgical History:  Procedure Laterality Date  . JOINT REPLACEMENT     RIGHT Hip Replacement  . JOINT REPLACEMENT     Knee Replacement  . SUPRAPUBIC PROSTATECTOMY      No current facility-administered medications for this visit.    No current outpatient medications on file.   Facility-Administered Medications Ordered in Other Visits  Medication Dose Route Frequency Provider Last Rate Last Dose  . 0.9 %  sodium chloride infusion   Intravenous Continuous Rod Can, MD 125 mL/hr at 07/07/18 1048    . acetaminophen (OFIRMEV) IV 1,000 mg  1,000 mg Intravenous To OR Qianna Clagett, Aaron Edelman, MD      . ceFAZolin (ANCEF) IVPB 2g/100 mL premix  2 g Intravenous On Call to [place], MD      . chlorhexidine (HIBICLENS) 4 % liquid 4 application  60 mL Topical Once Tyrese Capriotti, Aaron Edelman, MD      . fentaNYL (SUBLIMAZE) injection 50-100 mcg  50-100 mcg Intravenous Cheri Rous, MD   50 mcg at 07/07/18 1408  . lactated ringers infusion   Intravenous Continuous Nolon Nations, MD      . midazolam (VERSED) injection 1-2  mg  1-2 mg Intravenous Cheri Rous, MD      . povidone-iodine 10 % swab 2 application  2 application Topical Once Brailey Buescher, Aaron Edelman, MD      . ropivacaine (PF) 5 mg/mL (0.5%) (NAROPIN) injection    Anesthesia Intra-op Ellender, Karyl Kinnier, MD   30 mL at 07/07/18 1410  . tranexamic acid (CYKLOKAPRON) IVPB 1,000 mg  1,000 mg Intravenous To OR Aden Sek, Aaron Edelman, MD       No Known Allergies  Social History   Tobacco Use  . Smoking status: Never Smoker  . Smokeless tobacco:  Never Used  Substance Use Topics  . Alcohol use: No    No family history on file.    Review of Systems  Constitutional: Negative.   HENT: Negative.   Eyes: Negative.   Respiratory: Negative.   Cardiovascular: Negative.   Gastrointestinal: Negative.   Genitourinary: Negative.   Musculoskeletal: Positive for joint pain.  Skin: Negative.   Neurological: Negative.   Endo/Heme/Allergies: Negative.   Psychiatric/Behavioral: Negative.      Objective:  Physical Exam  Vitals reviewed. Constitutional: He is oriented to person, place, and time. He appears well-developed and well-nourished.  HENT:  Head: Normocephalic and atraumatic.  Eyes: Pupils are equal, round, and reactive to light. Conjunctivae and EOM are normal.  Neck: Normal range of motion. Neck supple.  Cardiovascular: Normal rate, regular rhythm and intact distal pulses.  Respiratory: Effort normal. No respiratory distress.  GI: Soft. He exhibits no distension.  Genitourinary:    Genitourinary Comments: deferred   Musculoskeletal:     Left knee: He exhibits decreased range of motion, swelling and abnormal alignment. Tenderness found. Medial joint line and lateral joint line tenderness noted.       Legs:  Neurological: He is alert and oriented to person, place, and time. He has normal reflexes.  Skin: Skin is warm and dry.  Psychiatric: He has a normal mood and affect. His behavior is normal. Judgment and thought content normal.    Vital signs in last 24 hours: @VSRANGES @  Labs:  Estimated body mass index is 27.67 kg/m as calculated from the following:   Height as of 07/07/18: 6' (1.829 m).   Weight as of 07/07/18: 92.5 kg.  Imaging Review Plain radiographs demonstrate severe degenerative joint disease of the left knee(s). The overall alignment is neutral.There is evidence of loosening of the femoral components. The bone quality appears to be poor for age and reported activity level. There is significant femoral bone  loss.    Assessment/Plan:  End stage arthritis, left knee(s) with failed previous arthroplasty.   The patient history, physical examination, clinical judgment of the provider and imaging studies are consistent with end stage degenerative joint disease of the left knee(s), previous total knee arthroplasty. Revision total knee arthroplasty is deemed medically necessary. The treatment options including medical management, injection therapy, arthroscopy and revision arthroplasty were discussed at length. The risks and benefits of revision total knee arthroplasty were presented and reviewed. The risks due to aseptic loosening, infection, stiffness, patella tracking problems, thromboembolic complications and other imponderables were discussed. The patient acknowledged the explanation, agreed to proceed with the plan and consent was signed. Patient is being admitted for inpatient treatment for surgery, pain control, PT, OT, prophylactic antibiotics, VTE prophylaxis, progressive ambulation and ADL's and discharge planning.The patient is planning to be discharged home with OPPT

## 2018-06-29 NOTE — Patient Instructions (Signed)
Douglas Hoffman  06/29/2018   Your procedure is scheduled on: 07-07-2018   Report to [facility] Main  Entrance     Report to admitting at 9:30AM    Call this number if you have problems the morning of surgery [phone]     Remember: Do not eat food or drink liquids :After Midnight. BRUSH YOUR TEETH MORNING OF SURGERY AND RINSE YOUR MOUTH OUT, NO CHEWING GUM CANDY OR MINTS.     Take these medicines the morning of surgery with A SIP OF WATER: allopurinol, amlodipine                                 You may not have any metal on your body including hair pins and              piercings  Do not wear jewelry, make-up, lotions, powders or perfumes, deodorant                      Men may shave face and neck.   Do not bring valuables to the hospital. [place].  Contacts, dentures or bridgework may not be worn into surgery.  Leave suitcase in the car. After surgery it may be brought to your room.                 Please read over the following fact sheets you were given: _____________________________________________________________________             [facility] - Preparing for Surgery Before surgery, you can play an important role.  Because skin is not sterile, your skin needs to be as free of germs as possible.  You can reduce the number of germs on your skin by washing with CHG (chlorahexidine gluconate) soap before surgery.  CHG is an antiseptic cleaner which kills germs and bonds with the skin to continue killing germs even after washing. Please DO NOT use if you have an allergy to CHG or antibacterial soaps.  If your skin becomes reddened/irritated stop using the CHG and inform your nurse when you arrive at Short Stay. Do not shave (including legs and underarms) for at least 48 hours prior to the first CHG shower.  You may shave your face/neck. Please follow these instructions carefully:  1.  Shower with CHG  Soap the night before surgery and the  morning of Surgery.  2.  If you choose to wash your hair, wash your hair first as usual with your  normal  shampoo.  3.  After you shampoo, rinse your hair and body thoroughly to remove the  shampoo.                           4.  Use CHG as you would any other liquid soap.  You can apply chg directly  to the skin and wash                       Gently with a scrungie or clean washcloth.  5.  Apply the CHG Soap to your body ONLY FROM THE NECK DOWN.   Do not use on face/ open  Wound or open sores. Avoid contact with eyes, ears mouth and genitals (private parts).                       Wash face,  Genitals (private parts) with your normal soap.             6.  Wash thoroughly, paying special attention to the area where your surgery  will be performed.  7.  Thoroughly rinse your body with warm water from the neck down.  8.  DO NOT shower/wash with your normal soap after using and rinsing off  the CHG Soap.                9.  Pat yourself dry with a clean towel.            10.  Wear clean pajamas.            11.  Place clean sheets on your bed the night of your first shower and do not  sleep with pets. Day of Surgery : Do not apply any lotions/deodorants the morning of surgery.  Please wear clean clothes to the hospital/surgery center.  FAILURE TO FOLLOW THESE INSTRUCTIONS MAY RESULT IN THE CANCELLATION OF YOUR SURGERY PATIENT SIGNATURE_________________________________  NURSE SIGNATURE__________________________________  ________________________________________________________________________   Douglas Hoffman  An incentive spirometer is a tool that can help keep your lungs clear and active. This tool measures how well you are filling your lungs with each breath. Taking long deep breaths may help reverse or decrease the chance of developing breathing (pulmonary) problems (especially infection) following:  A long period of time  when you are unable to move or be active. BEFORE THE PROCEDURE   If the spirometer includes an indicator to show your best effort, your nurse or respiratory therapist will set it to a desired goal.  If possible, sit up straight or lean slightly forward. Try not to slouch.  Hold the incentive spirometer in an upright position. INSTRUCTIONS FOR USE  1. Sit on the edge of your bed if possible, or sit up as far as you can in bed or on a chair. 2. Hold the incentive spirometer in an upright position. 3. Breathe out normally. 4. Place the mouthpiece in your mouth and seal your lips tightly around it. 5. Breathe in slowly and as deeply as possible, raising the piston or the ball toward the top of the column. 6. Hold your breath for 3-5 seconds or for as long as possible. Allow the piston or ball to fall to the bottom of the column. 7. Remove the mouthpiece from your mouth and breathe out normally. 8. Rest for a few seconds and repeat Steps 1 through 7 at least 10 times every 1-2 hours when you are awake. Take your time and take a few normal breaths between deep breaths. 9. The spirometer may include an indicator to show your best effort. Use the indicator as a goal to work toward during each repetition. 10. After each set of 10 deep breaths, practice coughing to be sure your lungs are clear. If you have an incision (the cut made at the time of surgery), support your incision when coughing by placing a pillow or rolled up towels firmly against it. Once you are able to get out of bed, walk around indoors and cough well. You may stop using the incentive spirometer when instructed by your caregiver.  RISKS AND COMPLICATIONS  Take your time so you do not get  dizzy or light-headed.  If you are in pain, you may need to take or ask for pain medication before doing incentive spirometry. It is harder to take a deep breath if you are having pain. AFTER USE  Rest and breathe slowly and easily.  It can be  helpful to keep track of a log of your progress. Your caregiver can provide you with a simple table to help with this. If you are using the spirometer at home, follow these instructions: [place] IF:   You are having difficultly using the spirometer.  You have trouble using the spirometer as often as instructed.  Your pain medication is not giving enough relief while using the spirometer.  You develop fever of 100.5 F (38.1 C) or higher. SEEK IMMEDIATE MEDICAL CARE IF:   You cough up bloody sputum that had not been present before.  You develop fever of 102 F (38.9 C) or greater.  You develop worsening pain at or near the incision site. MAKE SURE YOU:   Understand these instructions.  Will watch your condition.  Will get help right away if you are not doing well or get worse. Document Released: 08/31/2006 Document Revised: 07/13/2011 Document Reviewed: 11/01/2006 Exit[employer] Patient Information 2014 Exit[employer], [place].   ________________________________________________________________________  WHAT IS A BLOOD TRANSFUSION? Blood Transfusion Information  A transfusion is the replacement of blood or some of its parts. Blood is made up of multiple cells which provide different functions.  Red blood cells carry oxygen and are used for blood loss replacement.  White blood cells fight against infection.  Platelets control bleeding.  Plasma helps clot blood.  Other blood products are available for specialized needs, such as hemophilia or other clotting disorders. BEFORE THE TRANSFUSION  Who gives blood for transfusions?   Healthy volunteers who are fully evaluated to make sure their blood is safe. This is blood bank blood. Transfusion therapy is the safest it has ever been in the practice of medicine. Before blood is taken from a donor, a complete history is taken to make sure that person has no history of diseases nor engages in risky social behavior (examples are  intravenous drug use or sexual activity with multiple partners). The donor's travel history is screened to minimize risk of transmitting infections, such as malaria. The donated blood is tested for signs of infectious diseases, such as HIV and hepatitis. The blood is then tested to be sure it is compatible with you in order to minimize the chance of a transfusion reaction. If you or a relative donates blood, this is often done in anticipation of surgery and is not appropriate for emergency situations. It takes many days to process the donated blood. RISKS AND COMPLICATIONS Although transfusion therapy is very safe and saves many lives, the main dangers of transfusion include:   Getting an infectious disease.  Developing a transfusion reaction. This is an allergic reaction to something in the blood you were given. Every precaution is taken to prevent this. The decision to have a blood transfusion has been considered carefully by your caregiver before blood is given. Blood is not given unless the benefits outweigh the risks. AFTER THE TRANSFUSION  Right after receiving a blood transfusion, you will usually feel much better and more energetic. This is especially true if your red blood cells have gotten low (anemic). The transfusion raises the level of the red blood cells which carry oxygen, and this usually causes an energy increase.  The nurse administering the transfusion will  monitor you carefully for complications. HOME CARE INSTRUCTIONS  No special instructions are needed after a transfusion. You may find your energy is better. Speak with your caregiver about any limitations on activity for underlying diseases you may have. SEEK MEDICAL CARE IF:   Your condition is not improving after your transfusion.  You develop redness or irritation at the intravenous (IV) site. SEEK IMMEDIATE MEDICAL CARE IF:  Any of the following symptoms occur over the next 12 hours:  Shaking chills.  You have a  temperature by mouth above 102 F (38.9 C), not controlled by medicine.  Chest, back, or muscle pain.  People around you feel you are not acting correctly or are confused.  Shortness of breath or difficulty breathing.  Dizziness and fainting.  You get a rash or develop hives.  You have a decrease in urine output.  Your urine turns a dark color or changes to pink, red, or brown. Any of the following symptoms occur over the next 10 days:  You have a temperature by mouth above 102 F (38.9 C), not controlled by medicine.  Shortness of breath.  Weakness after normal activity.  The white part of the eye turns yellow (jaundice).  You have a decrease in the amount of urine or are urinating less often.  Your urine turns a dark color or changes to pink, red, or brown. Document Released: 04/17/2000 Document Revised: 07/13/2011 Document Reviewed: 12/05/2007 [facility] Patient Information 2014 [place], [place].  _______________________________________________________________________

## 2018-06-30 ENCOUNTER — Other Ambulatory Visit: Payer: Self-pay

## 2018-06-30 ENCOUNTER — Encounter (HOSPITAL_COMMUNITY)
Admission: RE | Admit: 2018-06-30 | Discharge: 2018-06-30 | Disposition: A | Discharge status: Home / Self Care | Payer: PPO | Source: Ambulatory Visit | Attending: Orthopedic Surgery | Admitting: Orthopedic Surgery

## 2018-06-30 ENCOUNTER — Encounter (HOSPITAL_COMMUNITY): Payer: Self-pay

## 2018-06-30 DIAGNOSIS — I10 Essential (primary) hypertension: Secondary | ICD-10-CM | POA: Diagnosis not present

## 2018-06-30 DIAGNOSIS — Z01818 Encounter for other preprocedural examination: Secondary | ICD-10-CM | POA: Insufficient documentation

## 2018-06-30 HISTORY — DX: Unspecified glaucoma: H40.9

## 2018-06-30 LAB — BASIC METABOLIC PANEL
Anion gap: 6 (ref 5–15)
BUN: 24 mg/dL — ABNORMAL HIGH (ref 8–23)
CO2: 25 mmol/L (ref 22–32)
Calcium: 10.1 mg/dL (ref 8.9–10.3)
Chloride: 109 mmol/L (ref 98–111)
Creatinine, Ser: 1.66 mg/dL — ABNORMAL HIGH (ref 0.61–1.24)
GFR calc Af Amer: 47 mL/min — ABNORMAL LOW (ref >60)
GFR calc non Af Amer: 41 mL/min — ABNORMAL LOW (ref >60)
Glucose, Bld: 107 mg/dL — ABNORMAL HIGH (ref 70–99)
POTASSIUM: 4.7 mmol/L (ref 3.5–5.1)
Sodium: 140 mmol/L (ref 135–145)

## 2018-06-30 LAB — ABO/RH: ABO/RH(D): A POS

## 2018-06-30 LAB — CBC
HCT: 46.6 % (ref 39.0–52.0)
Hemoglobin: 15.1 g/dL (ref 13.0–17.0)
MCH: 31.7 pg (ref 26.0–34.0)
MCHC: 32.4 g/dL (ref 30.0–36.0)
MCV: 97.7 fL (ref 80.0–100.0)
NRBC: 0 % (ref 0.0–0.2)
Platelets: 141 10*3/uL — ABNORMAL LOW (ref 150–400)
RBC: 4.77 MIL/uL (ref 4.22–5.81)
RDW: 12.6 % (ref 11.5–15.5)
WBC: 6.2 10*3/uL (ref 4.0–10.5)

## 2018-06-30 LAB — SURGICAL PCR SCREEN
MRSA, PCR: NEGATIVE
STAPHYLOCOCCUS AUREUS: POSITIVE — AB

## 2018-07-07 ENCOUNTER — Encounter (HOSPITAL_COMMUNITY): Payer: Self-pay | Admitting: *Deleted

## 2018-07-07 ENCOUNTER — Other Ambulatory Visit: Payer: Self-pay

## 2018-07-07 ENCOUNTER — Inpatient Hospital Stay (HOSPITAL_COMMUNITY): Payer: PPO

## 2018-07-07 ENCOUNTER — Inpatient Hospital Stay (HOSPITAL_COMMUNITY): Payer: PPO | Admitting: Certified Registered Nurse Anesthetist

## 2018-07-07 ENCOUNTER — Inpatient Hospital Stay (HOSPITAL_COMMUNITY): Payer: PPO | Admitting: Physician Assistant

## 2018-07-07 ENCOUNTER — Inpatient Hospital Stay (HOSPITAL_COMMUNITY)
Admission: RE | Admit: 2018-07-07 | Discharge: 2018-07-09 | DRG: 468 | Disposition: A | Discharge status: Home / Self Care | Payer: PPO | Attending: Orthopedic Surgery | Admitting: Orthopedic Surgery

## 2018-07-07 ENCOUNTER — Encounter (HOSPITAL_COMMUNITY)
Admission: RE | Disposition: A | Discharge status: Home / Self Care | Payer: Self-pay | Source: Home / Self Care | Attending: Orthopedic Surgery

## 2018-07-07 DIAGNOSIS — Z96641 Presence of right artificial hip joint: Secondary | ICD-10-CM | POA: Diagnosis not present

## 2018-07-07 DIAGNOSIS — I129 Hypertensive chronic kidney disease with stage 1 through stage 4 chronic kidney disease, or unspecified chronic kidney disease: Secondary | ICD-10-CM | POA: Diagnosis not present

## 2018-07-07 DIAGNOSIS — I10 Essential (primary) hypertension: Secondary | ICD-10-CM | POA: Diagnosis present

## 2018-07-07 DIAGNOSIS — Z923 Personal history of irradiation: Secondary | ICD-10-CM | POA: Diagnosis not present

## 2018-07-07 DIAGNOSIS — N189 Chronic kidney disease, unspecified: Secondary | ICD-10-CM | POA: Diagnosis not present

## 2018-07-07 DIAGNOSIS — Z86718 Personal history of other venous thrombosis and embolism: Secondary | ICD-10-CM

## 2018-07-07 DIAGNOSIS — S72422A Displaced fracture of lateral condyle of left femur, initial encounter for closed fracture: Secondary | ICD-10-CM | POA: Diagnosis not present

## 2018-07-07 DIAGNOSIS — Y831 Surgical operation with implant of artificial internal device as the cause of abnormal reaction of the patient, or of later complication, without mention of misadventure at the time of the procedure: Secondary | ICD-10-CM | POA: Diagnosis present

## 2018-07-07 DIAGNOSIS — Z8546 Personal history of malignant neoplasm of prostate: Secondary | ICD-10-CM | POA: Diagnosis not present

## 2018-07-07 DIAGNOSIS — Z471 Aftercare following joint replacement surgery: Secondary | ICD-10-CM | POA: Diagnosis not present

## 2018-07-07 DIAGNOSIS — Z96642 Presence of left artificial hip joint: Secondary | ICD-10-CM | POA: Diagnosis not present

## 2018-07-07 DIAGNOSIS — G8918 Other acute postprocedural pain: Secondary | ICD-10-CM | POA: Diagnosis not present

## 2018-07-07 DIAGNOSIS — T84033A Mechanical loosening of internal left knee prosthetic joint, initial encounter: Principal | ICD-10-CM | POA: Diagnosis present

## 2018-07-07 DIAGNOSIS — T84093A Other mechanical complication of internal left knee prosthesis, initial encounter: Secondary | ICD-10-CM

## 2018-07-07 DIAGNOSIS — Z96652 Presence of left artificial knee joint: Secondary | ICD-10-CM | POA: Diagnosis not present

## 2018-07-07 HISTORY — PX: TOTAL KNEE REVISION: SHX996

## 2018-07-07 LAB — POCT I-STAT 4, (NA,K, GLUC, HGB,HCT)
Glucose, Bld: 123 mg/dL — ABNORMAL HIGH (ref 70–99)
HCT: 34 % — ABNORMAL LOW (ref 39.0–52.0)
HEMOGLOBIN: 11.6 g/dL — AB (ref 13.0–17.0)
Potassium: 4.3 mmol/L (ref 3.5–5.1)
SODIUM: 140 mmol/L (ref 135–145)

## 2018-07-07 LAB — TYPE AND SCREEN
ABO/RH(D): A POS
ANTIBODY SCREEN: NEGATIVE

## 2018-07-07 SURGERY — TOTAL KNEE REVISION
Anesthesia: Spinal | Site: Knee | Laterality: Left

## 2018-07-07 MED ORDER — HYDROCODONE-ACETAMINOPHEN 5-325 MG PO TABS
1.0000 | ORAL_TABLET | ORAL | Status: DC | PRN
  Administered 2018-07-08: 2 via ORAL
  Administered 2018-07-08: 1 via ORAL
  Filled 2018-07-07 (×2): qty 2

## 2018-07-07 MED ORDER — SODIUM CHLORIDE 0.9 % IV SOLN
INTRAVENOUS | Status: DC
  Administered 2018-07-07: 11:00:00 via INTRAVENOUS

## 2018-07-07 MED ORDER — EPHEDRINE 5 MG/ML INJ
INTRAVENOUS | Status: AC
  Filled 2018-07-07: qty 10

## 2018-07-07 MED ORDER — DEXAMETHASONE SODIUM PHOSPHATE 10 MG/ML IJ SOLN
INTRAMUSCULAR | Status: DC | PRN
  Administered 2018-07-07: 4 mg via INTRAVENOUS

## 2018-07-07 MED ORDER — KETOROLAC TROMETHAMINE 15 MG/ML IJ SOLN: 15.0000 mg | Freq: Once | INTRAMUSCULAR | Status: DC | PRN

## 2018-07-07 MED ORDER — ALUM & MAG HYDROXIDE-SIMETH 200-200-20 MG/5ML PO SUSP: 30.0000 mL | ORAL | Status: DC | PRN

## 2018-07-07 MED ORDER — PROPOFOL 10 MG/ML IV BOLUS
INTRAVENOUS | Status: AC
  Filled 2018-07-07: qty 60

## 2018-07-07 MED ORDER — ALBUMIN HUMAN 5 % IV SOLN
INTRAVENOUS | Status: AC
  Filled 2018-07-07: qty 250

## 2018-07-07 MED ORDER — DIPHENHYDRAMINE HCL 12.5 MG/5ML PO ELIX: 12.5000 mg | ORAL_SOLUTION | ORAL | Status: DC | PRN

## 2018-07-07 MED ORDER — BUPIVACAINE HCL (PF) 0.5 % IJ SOLN
INTRAMUSCULAR | Status: DC | PRN
  Administered 2018-07-07: 3 mL via INTRATHECAL

## 2018-07-07 MED ORDER — MENTHOL 3 MG MT LOZG: 1.0000 | LOZENGE | OROMUCOSAL | Status: DC | PRN

## 2018-07-07 MED ORDER — METOCLOPRAMIDE HCL 5 MG PO TABS: 5.0000 mg | ORAL_TABLET | Freq: Three times a day (TID) | ORAL | Status: DC | PRN

## 2018-07-07 MED ORDER — BUPIVACAINE-EPINEPHRINE (PF) 0.25% -1:200000 IJ SOLN
INTRAMUSCULAR | Status: AC
  Filled 2018-07-07: qty 30

## 2018-07-07 MED ORDER — ONDANSETRON HCL 4 MG/2ML IJ SOLN: 4.0000 mg | Freq: Once | INTRAMUSCULAR | Status: DC | PRN

## 2018-07-07 MED ORDER — ALLOPURINOL 100 MG PO TABS
100.0000 mg | ORAL_TABLET | Freq: Every day | ORAL | Status: DC
  Administered 2018-07-08 – 2018-07-09 (×2): 100 mg via ORAL
  Filled 2018-07-07 (×2): qty 1

## 2018-07-07 MED ORDER — CEFAZOLIN SODIUM-DEXTROSE 2-4 GM/100ML-% IV SOLN
2.0000 g | INTRAVENOUS | Status: AC
  Administered 2018-07-07: 2 g via INTRAVENOUS
  Filled 2018-07-07: qty 100

## 2018-07-07 MED ORDER — DOCUSATE SODIUM 100 MG PO CAPS
100.0000 mg | ORAL_CAPSULE | Freq: Two times a day (BID) | ORAL | Status: DC
  Administered 2018-07-07 – 2018-07-09 (×4): 100 mg via ORAL
  Filled 2018-07-07 (×4): qty 1

## 2018-07-07 MED ORDER — SENNA 8.6 MG PO TABS
1.0000 | ORAL_TABLET | Freq: Two times a day (BID) | ORAL | Status: DC
  Administered 2018-07-07 – 2018-07-09 (×4): 8.6 mg via ORAL
  Filled 2018-07-07 (×4): qty 1

## 2018-07-07 MED ORDER — ASPIRIN 81 MG PO CHEW
81.0000 mg | CHEWABLE_TABLET | Freq: Two times a day (BID) | ORAL | Status: DC
  Administered 2018-07-08: 81 mg via ORAL
  Filled 2018-07-07: qty 1

## 2018-07-07 MED ORDER — POVIDONE-IODINE 10 % EX SWAB: 2.0000 "application " | Freq: Once | CUTANEOUS | Status: DC

## 2018-07-07 MED ORDER — FENTANYL CITRATE (PF) 100 MCG/2ML IJ SOLN
50.0000 ug | INTRAMUSCULAR | Status: DC
  Administered 2018-07-07 (×2): 50 ug via INTRAVENOUS

## 2018-07-07 MED ORDER — DEXAMETHASONE SODIUM PHOSPHATE 10 MG/ML IJ SOLN
INTRAMUSCULAR | Status: AC
  Filled 2018-07-07: qty 1

## 2018-07-07 MED ORDER — LACTATED RINGERS IV SOLN
INTRAVENOUS | Status: DC
  Administered 2018-07-07 (×4): via INTRAVENOUS

## 2018-07-07 MED ORDER — SODIUM CHLORIDE 0.9 % IV SOLN
INTRAVENOUS | Status: DC
  Administered 2018-07-07 – 2018-07-08 (×2): via INTRAVENOUS

## 2018-07-07 MED ORDER — PROPOFOL 500 MG/50ML IV EMUL
INTRAVENOUS | Status: DC | PRN
  Administered 2018-07-07: 100 ug/kg/min via INTRAVENOUS

## 2018-07-07 MED ORDER — PROPOFOL 10 MG/ML IV BOLUS
INTRAVENOUS | Status: AC
  Filled 2018-07-07: qty 20

## 2018-07-07 MED ORDER — PROPOFOL 10 MG/ML IV BOLUS
INTRAVENOUS | Status: AC
  Filled 2018-07-07: qty 40

## 2018-07-07 MED ORDER — STERILE WATER FOR IRRIGATION IR SOLN
Status: DC | PRN
  Administered 2018-07-07: 2000 mL

## 2018-07-07 MED ORDER — POLYETHYLENE GLYCOL 3350 17 G PO PACK: 17.0000 g | PACK | Freq: Every day | ORAL | Status: DC | PRN

## 2018-07-07 MED ORDER — FENTANYL CITRATE (PF) 100 MCG/2ML IJ SOLN
INTRAMUSCULAR | Status: AC
  Administered 2018-07-07: 50 ug via INTRAVENOUS
  Filled 2018-07-07: qty 2

## 2018-07-07 MED ORDER — HYDROCODONE-ACETAMINOPHEN 7.5-325 MG PO TABS
1.0000 | ORAL_TABLET | ORAL | Status: DC | PRN
  Administered 2018-07-08: 1 via ORAL
  Administered 2018-07-09: 2 via ORAL
  Filled 2018-07-07: qty 2
  Filled 2018-07-07: qty 1

## 2018-07-07 MED ORDER — AMLODIPINE BESYLATE 5 MG PO TABS
5.0000 mg | ORAL_TABLET | Freq: Every day | ORAL | Status: DC
  Administered 2018-07-09: 5 mg via ORAL
  Filled 2018-07-07 (×2): qty 1

## 2018-07-07 MED ORDER — TRANEXAMIC ACID-NACL 1000-0.7 MG/100ML-% IV SOLN
1000.0000 mg | INTRAVENOUS | Status: AC
  Administered 2018-07-07: 1000 mg via INTRAVENOUS
  Filled 2018-07-07: qty 100

## 2018-07-07 MED ORDER — ALBUMIN HUMAN 5 % IV SOLN
INTRAVENOUS | Status: DC | PRN
  Administered 2018-07-07 (×2): via INTRAVENOUS

## 2018-07-07 MED ORDER — FENTANYL CITRATE (PF) 100 MCG/2ML IJ SOLN
INTRAMUSCULAR | Status: AC
  Filled 2018-07-07: qty 2

## 2018-07-07 MED ORDER — METHOCARBAMOL 500 MG IVPB - SIMPLE MED
500.0000 mg | Freq: Four times a day (QID) | INTRAVENOUS | Status: DC | PRN
  Administered 2018-07-07: 500 mg via INTRAVENOUS
  Filled 2018-07-07: qty 50

## 2018-07-07 MED ORDER — PHENOL 1.4 % MT LIQD: 1.0000 | OROMUCOSAL | Status: DC | PRN

## 2018-07-07 MED ORDER — PHENYLEPHRINE 40 MCG/ML (10ML) SYRINGE FOR IV PUSH (FOR BLOOD PRESSURE SUPPORT)
PREFILLED_SYRINGE | INTRAVENOUS | Status: DC | PRN
  Administered 2018-07-07 (×3): 80 ug via INTRAVENOUS

## 2018-07-07 MED ORDER — KETOROLAC TROMETHAMINE 30 MG/ML IJ SOLN
INTRAMUSCULAR | Status: DC | PRN
  Administered 2018-07-07: 30 mg via INTRAVENOUS

## 2018-07-07 MED ORDER — ISOPROPYL ALCOHOL 70 % SOLN
Status: DC | PRN
  Administered 2018-07-07: 1 via TOPICAL

## 2018-07-07 MED ORDER — PHENYLEPHRINE 40 MCG/ML (10ML) SYRINGE FOR IV PUSH (FOR BLOOD PRESSURE SUPPORT)
PREFILLED_SYRINGE | INTRAVENOUS | Status: AC
  Filled 2018-07-07: qty 10

## 2018-07-07 MED ORDER — MIDAZOLAM HCL 2 MG/2ML IJ SOLN
INTRAMUSCULAR | Status: AC
  Filled 2018-07-07: qty 2

## 2018-07-07 MED ORDER — FENTANYL CITRATE (PF) 100 MCG/2ML IJ SOLN
25.0000 ug | INTRAMUSCULAR | Status: DC | PRN
  Administered 2018-07-07 (×2): 50 ug via INTRAVENOUS

## 2018-07-07 MED ORDER — ACETAMINOPHEN 325 MG PO TABS: 325.0000 mg | ORAL_TABLET | Freq: Four times a day (QID) | ORAL | Status: DC | PRN

## 2018-07-07 MED ORDER — ONDANSETRON HCL 4 MG/2ML IJ SOLN
INTRAMUSCULAR | Status: AC
  Filled 2018-07-07: qty 2

## 2018-07-07 MED ORDER — SODIUM CHLORIDE (PF) 0.9 % IJ SOLN
INTRAMUSCULAR | Status: DC | PRN
  Administered 2018-07-07: 10 mL via INTRAVENOUS

## 2018-07-07 MED ORDER — ACETAMINOPHEN 10 MG/ML IV SOLN
1000.0000 mg | INTRAVENOUS | Status: AC
  Administered 2018-07-07: 1000 mg via INTRAVENOUS
  Filled 2018-07-07: qty 100

## 2018-07-07 MED ORDER — ONDANSETRON HCL 4 MG/2ML IJ SOLN: 4.0000 mg | Freq: Four times a day (QID) | INTRAMUSCULAR | Status: DC | PRN

## 2018-07-07 MED ORDER — EPHEDRINE SULFATE-NACL 50-0.9 MG/10ML-% IV SOSY
PREFILLED_SYRINGE | INTRAVENOUS | Status: DC | PRN
  Administered 2018-07-07: 10 mg via INTRAVENOUS
  Administered 2018-07-07: 5 mg via INTRAVENOUS
  Administered 2018-07-07 (×2): 10 mg via INTRAVENOUS
  Administered 2018-07-07: 5 mg via INTRAVENOUS

## 2018-07-07 MED ORDER — 0.9 % SODIUM CHLORIDE (POUR BTL) OPTIME
TOPICAL | Status: DC | PRN
  Administered 2018-07-07: 1000 mL

## 2018-07-07 MED ORDER — ROPIVACAINE HCL 5 MG/ML IJ SOLN
INTRAMUSCULAR | Status: DC | PRN
  Administered 2018-07-07: 30 mL via PERINEURAL

## 2018-07-07 MED ORDER — ONDANSETRON HCL 4 MG PO TABS: 4.0000 mg | ORAL_TABLET | Freq: Four times a day (QID) | ORAL | Status: DC | PRN

## 2018-07-07 MED ORDER — MIDAZOLAM HCL 2 MG/2ML IJ SOLN
1.0000 mg | INTRAMUSCULAR | Status: DC
  Administered 2018-07-07: 1 mg via INTRAVENOUS

## 2018-07-07 MED ORDER — KETOROLAC TROMETHAMINE 30 MG/ML IJ SOLN
INTRAMUSCULAR | Status: AC
  Filled 2018-07-07: qty 1

## 2018-07-07 MED ORDER — SODIUM CHLORIDE (PF) 0.9 % IJ SOLN
INTRAMUSCULAR | Status: AC
  Filled 2018-07-07: qty 10

## 2018-07-07 MED ORDER — BUPIVACAINE-EPINEPHRINE 0.25% -1:200000 IJ SOLN
INTRAMUSCULAR | Status: DC | PRN
  Administered 2018-07-07: 30 mL

## 2018-07-07 MED ORDER — CEFAZOLIN SODIUM-DEXTROSE 2-4 GM/100ML-% IV SOLN
2.0000 g | Freq: Four times a day (QID) | INTRAVENOUS | Status: AC
  Administered 2018-07-07 – 2018-07-08 (×2): 2 g via INTRAVENOUS
  Filled 2018-07-07 (×2): qty 100

## 2018-07-07 MED ORDER — METHOCARBAMOL 500 MG PO TABS
500.0000 mg | ORAL_TABLET | Freq: Four times a day (QID) | ORAL | Status: DC | PRN
  Administered 2018-07-08: 500 mg via ORAL
  Filled 2018-07-07: qty 1

## 2018-07-07 MED ORDER — LISINOPRIL 20 MG PO TABS
20.0000 mg | ORAL_TABLET | Freq: Every day | ORAL | Status: DC
  Administered 2018-07-09: 20 mg via ORAL
  Filled 2018-07-07 (×2): qty 1

## 2018-07-07 MED ORDER — CHLORHEXIDINE GLUCONATE 4 % EX LIQD: 60.0000 mL | Freq: Once | CUTANEOUS | Status: DC

## 2018-07-07 MED ORDER — METHOCARBAMOL 500 MG IVPB - SIMPLE MED
INTRAVENOUS | Status: AC
  Filled 2018-07-07: qty 50

## 2018-07-07 MED ORDER — MORPHINE SULFATE (PF) 2 MG/ML IV SOLN: 0.5000 mg | INTRAVENOUS | Status: DC | PRN

## 2018-07-07 MED ORDER — SODIUM CHLORIDE 0.9 % IV SOLN
INTRAVENOUS | Status: DC | PRN
  Administered 2018-07-07: 30 ug/min via INTRAVENOUS

## 2018-07-07 MED ORDER — DEXAMETHASONE SODIUM PHOSPHATE 10 MG/ML IJ SOLN
10.0000 mg | Freq: Once | INTRAMUSCULAR | Status: AC
  Administered 2018-07-08: 10 mg via INTRAVENOUS
  Filled 2018-07-07: qty 1

## 2018-07-07 MED ORDER — METOCLOPRAMIDE HCL 5 MG/ML IJ SOLN: 5.0000 mg | Freq: Three times a day (TID) | INTRAMUSCULAR | Status: DC | PRN

## 2018-07-07 SURGICAL SUPPLY — 94 items
7.0mm round bur ×3 IMPLANT
ADAPTER FEM OFFST VANGUARD 2.5 (Knees) ×3 IMPLANT
ADAPTER OFFSET W/SCREWS 5 (Joint) ×2 IMPLANT
ADAPTER OFFSET W/SCREWS 5MM (Joint) ×1 IMPLANT
AUG FEM KNEE LG CRUCIATE WING (Orthopedic Implant) ×3 IMPLANT
AUGMENT FEM 75X10 POST W/BOLT (Miscellaneous) ×6 IMPLANT
AUGMENT FEM KNEE LG CRUCI WING (Orthopedic Implant) ×1 IMPLANT
BAG DECANTER FOR FLEXI CONT (MISCELLANEOUS) IMPLANT
BAG ZIPLOCK 12X15 (MISCELLANEOUS) ×6 IMPLANT
BANDAGE ACE 6X5 VEL STRL LF (GAUZE/BANDAGES/DRESSINGS) ×3 IMPLANT
BEARING TIBIAL VG 79X83X12 (Joint) ×1 IMPLANT
BLADE SAW SGTL 13.0X1.19X90.0M (BLADE) IMPLANT
BLADE SAW SGTL 81X20 HD (BLADE) IMPLANT
BLADE SURG SZ10 CARB STEEL (BLADE) ×6 IMPLANT
BNDG ELASTIC 6X15 VLCR STRL LF (GAUZE/BANDAGES/DRESSINGS) ×3 IMPLANT
BRUSH FEMORAL CANAL (MISCELLANEOUS) IMPLANT
BUR MICRO 7.0 ROUND ×3 IMPLANT
CEMENT BONE REFOBACIN R1X40 US (Cement) ×12 IMPLANT
CHLORAPREP W/TINT 26ML (MISCELLANEOUS) ×6 IMPLANT
COMPONENT FEM 75 LT W/SCRW (Miscellaneous) ×3 IMPLANT
COVER SURGICAL LIGHT HANDLE (MISCELLANEOUS) ×3 IMPLANT
COVER WAND RF STERILE (DRAPES) ×3 IMPLANT
CUFF TOURN SGL QUICK 34 (TOURNIQUET CUFF) ×2
CUFF TRNQT CYL 34X4.125X (TOURNIQUET CUFF) ×1 IMPLANT
DECANTER SPIKE VIAL GLASS SM (MISCELLANEOUS) ×3 IMPLANT
DERMABOND ADVANCED (GAUZE/BANDAGES/DRESSINGS) ×2
DERMABOND ADVANCED .7 DNX12 (GAUZE/BANDAGES/DRESSINGS) ×1 IMPLANT
DRAPE POUCH INSTRU U-SHP 10X18 (DRAPES) ×3 IMPLANT
DRAPE SHEET LG 3/4 BI-LAMINATE (DRAPES) ×9 IMPLANT
DRAPE U-SHAPE 47X51 STRL (DRAPES) ×3 IMPLANT
DRSG AQUACEL AG ADV 3.5X10 (GAUZE/BANDAGES/DRESSINGS) IMPLANT
DRSG AQUACEL AG ADV 3.5X14 (GAUZE/BANDAGES/DRESSINGS) ×3 IMPLANT
DRSG TEGADERM 4X4.75 (GAUZE/BANDAGES/DRESSINGS) ×3 IMPLANT
ELECT BLADE TIP CTD 4 INCH (ELECTRODE) ×3 IMPLANT
ELECT PENCIL ROCKER SW 15FT (MISCELLANEOUS) ×3 IMPLANT
ELECT REM PT RETURN 15FT ADLT (MISCELLANEOUS) ×3 IMPLANT
EVACUATOR 1/8 PVC DRAIN (DRAIN) ×3 IMPLANT
FEMORAL META CONE 35MM LRG LT (Knees) ×3 IMPLANT
GAUZE SPONGE 4X4 12PLY STRL (GAUZE/BANDAGES/DRESSINGS) IMPLANT
GLOVE BIO SURGEON STRL SZ8.5 (GLOVE) ×9 IMPLANT
GLOVE BIOGEL PI IND STRL 6.5 (GLOVE) ×1 IMPLANT
GLOVE BIOGEL PI IND STRL 7.0 (GLOVE) ×2 IMPLANT
GLOVE BIOGEL PI IND STRL 7.5 (GLOVE) ×2 IMPLANT
GLOVE BIOGEL PI IND STRL 8.5 (GLOVE) ×4 IMPLANT
GLOVE BIOGEL PI INDICATOR 6.5 (GLOVE) ×2
GLOVE BIOGEL PI INDICATOR 7.0 (GLOVE) ×4
GLOVE BIOGEL PI INDICATOR 7.5 (GLOVE) ×4
GLOVE BIOGEL PI INDICATOR 8.5 (GLOVE) ×8
GLOVE ECLIPSE 8.0 STRL XLNG CF (GLOVE) ×3 IMPLANT
GLOVE SURG SS PI 6.5 STRL IVOR (GLOVE) ×3 IMPLANT
GOWN SPEC L3 XXLG W/TWL (GOWN DISPOSABLE) ×6 IMPLANT
GOWN STRL REUS W/ TWL LRG LVL3 (GOWN DISPOSABLE) ×3 IMPLANT
GOWN STRL REUS W/TWL LRG LVL3 (GOWN DISPOSABLE) ×6
GOWN STRL REUS W/TWL XL LVL3 (GOWN DISPOSABLE) ×12 IMPLANT
HANDPIECE INTERPULSE COAX TIP (DISPOSABLE) ×2
HOLDER FOLEY CATH W/STRAP (MISCELLANEOUS) ×3 IMPLANT
HOOD PEEL AWAY FLYTE STAYCOOL (MISCELLANEOUS) ×9 IMPLANT
JET LAVAGE IRRISEPT WOUND (IRRIGATION / IRRIGATOR) ×3
LAVAGE JET IRRISEPT WOUND (IRRIGATION / IRRIGATOR) ×1 IMPLANT
MANIFOLD NEPTUNE II (INSTRUMENTS) ×3 IMPLANT
MARKER SKIN DUAL TIP RULER LAB (MISCELLANEOUS) ×6 IMPLANT
NEEDLE SPNL 18GX3.5 QUINCKE PK (NEEDLE) ×3 IMPLANT
NS IRRIG 1000ML POUR BTL (IV SOLUTION) ×3 IMPLANT
PADDING CAST COTTON 6X4 STRL (CAST SUPPLIES) IMPLANT
PADDING CAST SYN 6 (CAST SUPPLIES) ×2
PADDING CAST SYNTHETIC 6X4 NS (CAST SUPPLIES) ×1 IMPLANT
PROTECTOR NERVE ULNAR (MISCELLANEOUS) ×3 IMPLANT
SAW OSC TIP CART 19.5X105X1.3 (SAW) ×3 IMPLANT
SEALER BIPOLAR AQUA 6.0 (INSTRUMENTS) ×3 IMPLANT
SET HNDPC FAN SPRY TIP SCT (DISPOSABLE) ×1 IMPLANT
SET PAD KNEE POSITIONER (MISCELLANEOUS) ×3 IMPLANT
SPONGE DRAIN TRACH 4X4 STRL 2S (GAUZE/BANDAGES/DRESSINGS) ×3 IMPLANT
SPONGE LAP 18X18 RF (DISPOSABLE) IMPLANT
STEM SPLINED KNEE 16MMX80MM (Joint) ×3 IMPLANT
STEM SPLINED KNEE 18MMX120MM (Joint) ×3 IMPLANT
SUT MNCRL AB 3-0 PS2 18 (SUTURE) ×3 IMPLANT
SUT MON AB 2-0 CT1 36 (SUTURE) ×9 IMPLANT
SUT STRATAFIX PDO 1 14 VIOLET (SUTURE) ×2
SUT STRATFX PDO 1 14 VIOLET (SUTURE) ×1
SUT VIC AB 1 CT1 27 (SUTURE) ×4
SUT VIC AB 1 CT1 27XBRD ANTBC (SUTURE) ×2 IMPLANT
SUT VIC AB 1 CTX 36 (SUTURE) ×4
SUT VIC AB 1 CTX36XBRD ANBCTR (SUTURE) ×2 IMPLANT
SUT VIC AB 2-0 CT1 27 (SUTURE) ×2
SUT VIC AB 2-0 CT1 TAPERPNT 27 (SUTURE) ×1 IMPLANT
SUTURE STRATFX PDO 1 14 VIOLET (SUTURE) ×1 IMPLANT
SYR 50ML LL SCALE MARK (SYRINGE) ×3 IMPLANT
TIBIAL BEARING VG 79X83X12 (Joint) ×3 IMPLANT
TOWER CARTRIDGE SMART MIX (DISPOSABLE) ×3 IMPLANT
TRAY FOLEY MTR SLVR 16FR STAT (SET/KITS/TRAYS/PACK) ×3 IMPLANT
TRAY TIBIAL VANGUARD 79MM REV (Orthopedic Implant) ×3 IMPLANT
WATER STERILE IRR 1000ML POUR (IV SOLUTION) ×3 IMPLANT
WRAP KNEE MAXI GEL POST OP (GAUZE/BANDAGES/DRESSINGS) ×3 IMPLANT
YANKAUER SUCT BULB TIP 10FT TU (MISCELLANEOUS) ×3 IMPLANT

## 2018-07-07 NOTE — Progress Notes (Signed)
Assisted Dr. Ellender with left, ultrasound guided, adductor canal block. Side rails up, monitors on throughout procedure. See vital signs in flow sheet. Tolerated Procedure well.  

## 2018-07-07 NOTE — Anesthesia Preprocedure Evaluation (Signed)
Anesthesia Evaluation  Patient identified by MRN, date of birth, ID band Patient awake    Reviewed: Allergy & Precautions, NPO status , Patient's Chart, lab work & pertinent test results  Airway Mallampati: II  TM Distance: >3 FB Neck ROM: Full    Dental no notable dental hx.    Pulmonary neg pulmonary ROS,    Pulmonary exam normal breath sounds clear to auscultation       Cardiovascular hypertension, Normal cardiovascular exam Rhythm:Regular Rate:Normal     Neuro/Psych negative neurological ROS  negative psych ROS   GI/Hepatic negative GI ROS, Neg liver ROS,   Endo/Other  negative endocrine ROS  Renal/GU Renal InsufficiencyRenal disease     Musculoskeletal negative musculoskeletal ROS (+)   Abdominal   Peds  Hematology negative hematology ROS (+)   Anesthesia Other Findings   Reproductive/Obstetrics negative OB ROS                             Anesthesia Physical Anesthesia Plan  ASA: II  Anesthesia Plan: Spinal   Post-op Pain Management:    Induction: Intravenous  PONV Risk Score and Plan: 3 and Dexamethasone, Ondansetron and Treatment may vary due to age or medical condition  Airway Management Planned: Natural Airway  Additional Equipment: None  Intra-op Plan:   Post-operative Plan:   Informed Consent: I have reviewed the patients History and Physical, chart, labs and discussed the procedure including the risks, benefits and alternatives for the proposed anesthesia with the patient or authorized representative who has indicated his/her understanding and acceptance.     Dental advisory given  Plan Discussed with: CRNA  Anesthesia Plan Comments:        Anesthesia Quick Evaluation

## 2018-07-07 NOTE — Op Note (Signed)
OPERATIVE REPORT   07/07/2018  8:36 PM  PATIENT:  Douglas Hoffman   SURGEON:  Bertram Savin, MD  ASSISTANT:  Staff.   PREOPERATIVE DIAGNOSIS: Failed left total knee arthroplasty.  POSTOPERATIVE DIAGNOSIS:  Same.  PROCEDURE: Revision left total knee arthroplasty including femoral and tibial components.  ANESTHESIA:   Spinal and abductor canal block.  ANTIBIOTICS: 2 g Ancef.  EXPLANTS: Zimmer NexGen PS femur, tibial component, PS poly-liner.  IMPLANTS: 1.  Zimmer trabecular metal metaphyseal femoral cone size large left. 2. Biomet 360 size 75 femoral component with 10 mm posterior augment x2, 2.5 mm offset adapter, and 18 x 120 mm splined stem. 3.  Biomet 360 tibial tray size 79 mm with large cruciate wing, 5 mm offset adapter, and 16 x 80 mm splined stem. 4.  12 mm constrained PS tibial bearing. 5.  Biomet Refobecin R bone cement.  SPECIMENS: Femoral interface membrane for tissue culture.  COMPLICATIONS: Fracture of distal lateral femoral condyle cortical shell.  DISPOSITION: Stable to PACU.  SURGICAL INDICATIONS:  Bacilio Abascal is a 73 y.o. male with a diagnosis of failed left total knee arthroplasty secondary to aseptic loosening of the femoral component with significant subsidence of the femoral component with underlying bone loss.  Preoperative work-up was negative for infection.  We discussed that this is a very challenging situation due to the amount of femoral bone destruction.  We did discuss the risk, benefits, and alternatives, and he elected to proceed.  The risks, benefits, and alternatives were discussed with the patient preoperatively including but not limited to the risks of infection, bleeding, nerve / blood vessel injury, fracture of bone, loosening or failure of implant, stiffness, cardiopulmonary complications, the need for repeat surgery, among others, and the patient was willing to proceed.  PROCEDURE IN DETAIL: Identified the patient in the holding area  using 2 identifiers.  The surgical site was marked by myself.  Abductor canal was placed by anesthesia.  He was taken to the operating room, placed supine on the operating room table.  Spinal anesthesia was obtained.  Foley catheter was placed.  All bony prominences were well-padded.  The left lower extremity was prepped and draped in the normal sterile surgical fashion.  Timeout was called, verifying site and site of surgery.  He did receive IV antibiotics within 60 minutes of beginning the procedure.  I used a #10 blade to sharply excise his previous anterior knee incision.  Full-thickness skin flaps were created.  Irrisept solution was used throughout the procedure.  I created a standard medial parapatellar arthrotomy.  There was no effusion present.  I created a limited medial release.  The knee was brought into full extension, and I performed a radical synovectomy of the medial gutter, lateral gutter and suprapatellar pouch.  Infrapatellar scar was sharply excised with Bovie electrocautery.  The patellar component was stable. The femoral component was grossly loose and subsided.  I flexed the knee and use an osteotome to remove the polyethylene liner.  The femoral component was then easily removed by hand.  There was significant metaphyseal bone loss of the distal femur, especially laterally.  The medial cortical shell containing the MCL attachment was adequate.  The lateral cortical shell was very thin.  I turned my attention to the tibia.  I used an ACL saw to disrupt the interface between the cement and the implant.  I used osteotomes as well.  The tibial component was removed without any significant bone loss.  The cement was removed  with osteotomes.  I used the entry drill to gain access to the medullary canal of the femur and tibia.  I sequentially reamed up to a 16 mm reamer on the tibia and an 18 mm reamer on the femur.  I then sized the proximal tibia to a 79.  I used a 5 mm offset adapter to  produce adequate coverage.  There was no tibial bone defect.  I punched the proximal tibia for a large cruciate wing.  The trial tibial component was assembled on the back table and inserted into the bone with excellent fit.  I then turned my attention to the femur.  I used broaches to prepare the femur for a cone.  Bur was used to shape the femur to accept the cone.  I sized the femur to a 75 mm femur.  The posterior femoral condyles were deficient as well.  Therefore I plan for 10 mm posterior medial and posterior lateral augments.  The trial femur was inserted.  The box was punched.  I placed the trial distal femur cone.  The trial femoral component was then assembled in its entirety on the back table and inserted with excellent fit.  Trial polyliner was placed.  The knee was stable throughout a range of motion.  Flexion and extension gaps were well-balanced.  All the trial components were removed.  The sclerotic proximal tibial bone was drilled.  The lower extremity was exsanguinated with gravity and the tourniquet was inflated to 300 mmHg.  The cut bony surfaces were irrigated with pulse lavage.  The real components were opened and assembled on the back table.  I impacted the distal femur cone.  Upon fully seating the cone, there was a fracture to the lateral cortical shell of the distal femur.  The cone still had excellent press-fit stability.  I then cemented the tibia and femur sequentially using hybrid fixation technique.  Trial polyliner was placed.  The knee was brought into extension while the cement polymerized.  Excess cement was cleared.  The small fractured fragment of the distal femur laterally was sharply excised with Bovie electrocautery.  The trial polyliner was exchanged for the real liner.  The locking clip was placed.  The knee had excellent varus valgus stability throughout a full range of motion.  Flexion and extension gaps were balanced.    The tourniquet was let down and meticulous  hemostasis was achieved with Bovie electrocautery and the aqua mantis.  The arthrotomy was closed with #1 Vicryl and strata fix over a medium Hemovac drain.  Deep dermal layer was closed with 2-0 Monocryl interrupted sutures, staples for skin.  Dermabond was applied to the skin.  Once the Dermabond dried, an Aquacel dressing was applied followed by cast padding and an Ace wrap.  The patient was then awakened from anesthesia and taken to the PACU in stable condition.  Sponge, needle, and instrument counts were correct in the case x2.  The patient tolerated the procedure well.  POSTOPERATIVE PLAN: Postoperatively, the patient be admitted to the hospital.  Touchdown weightbearing left lower extremity.  Begin physical therapy for mobilization and range of motion of the left knee.  Begin Eliquis for DVT prophylaxis.  He will work with physical therapy and we will discharge home when medically ready.  He will return to the office in 2 weeks for routine postop care.

## 2018-07-07 NOTE — Transfer of Care (Signed)
Immediate Anesthesia Transfer of Care Note  Patient: Douglas Hoffman  Procedure(s) Performed: Procedure(s): TOTAL KNEE REVISION LEFT KNEE ARTHROPLASTY WITH FEMORAL AND TIBIAL COMPONENTS (Left)  Patient Location: PACU  Anesthesia Type:Spinal  Level of Consciousness:  sedated, patient cooperative and responds to stimulation  Airway & Oxygen Therapy:Patient Spontanous Breathing and Patient connected to face mask oxgen  Post-op Assessment:  Report given to PACU RN and Post -op Vital signs reviewed and stable  Post vital signs:  Reviewed and stable  Last Vitals:  Vitals:   07/07/18 1421 07/07/18 2019  BP:    Pulse: 63 (P) 84  Resp: 18   Temp:  (P) 36.4 C  SpO2: 100% (P) 614%    Complications: No apparent anesthesia complications

## 2018-07-07 NOTE — Interval H&P Note (Signed)
History and Physical Interval Note:  07/07/2018 2:49 PM  Douglas Hoffman  has presented today for surgery, with the diagnosis of Failed LEFT TOTAL KNEE ARTHROPLASTY  The various methods of treatment have been discussed with the patient and family. After consideration of risks, benefits and other options for treatment, the patient has consented to  Procedure(s): TOTAL KNEE REVISION (Left) as a surgical intervention .  The patient's history has been reviewed, patient examined, no change in status, stable for surgery.  I have reviewed the patient's chart and labs.  Questions were answered to the patient's satisfaction.     Hilton Cork Jams Trickett

## 2018-07-07 NOTE — Anesthesia Procedure Notes (Signed)
Spinal  Patient location during procedure: OR Start time: 07/07/2018 3:10 PM End time: 07/07/2018 3:20 PM Staffing Anesthesiologist: Murvin Natal, MD Performed: anesthesiologist  Preanesthetic Checklist Completed: patient identified, surgical consent, pre-op evaluation, timeout performed, IV checked, risks and benefits discussed and monitors and equipment checked Spinal Block Patient position: sitting Prep: DuraPrep Patient monitoring: cardiac monitor, continuous pulse ox and blood pressure Approach: left paramedian Location: L4-5 Injection technique: single-shot Needle Needle type: Quincke  Needle gauge: 22 G Needle length: 9 cm Assessment Sensory level: T10 Additional Notes Functioning IV was confirmed and monitors were applied. Sterile prep and drape, including hand hygiene and sterile gloves were used. The patient was positioned and the spine was prepped. The skin was anesthetized with lidocaine.  Unable to aspirate CSF x 2 with 24g Pencan. Free flow of clear CSF was obtained prior to injecting local anesthetic into the CSF.  The spinal needle aspirated freely following injection.  The needle was carefully withdrawn.  The patient tolerated the procedure well.

## 2018-07-07 NOTE — Anesthesia Procedure Notes (Addendum)
Anesthesia Regional Block: Adductor canal block   Pre-Anesthetic Checklist: ,, timeout performed, Correct Patient, Correct Site, Correct Laterality, Correct Procedure,, site marked, risks and benefits discussed, Surgical consent,  Pre-op evaluation,  At surgeon's request and post-op pain management  Laterality: Left  Prep: chloraprep       Needles:  Injection technique: Single-shot  Needle Type: Echogenic Stimulator Needle     Needle Length: 9cm  Needle Gauge: 21     Additional Needles:   Procedures:,,,, ultrasound used (permanent image in chart),,,,  Narrative:  Start time: 07/07/2018 2:05 PM End time: 07/07/2018 2:15 PM Injection made incrementally with aspirations every 5 mL.  Performed by: Personally  Anesthesiologist: Murvin Natal, MD  Additional Notes: Functioning IV was confirmed and monitors were applied. A time-out was performed. Hand hygiene and sterile gloves were used. The thigh was placed in a frog-leg position and prepped in a sterile fashion. A 14mm 21ga Arrow echogenic stimulator needle was placed using ultrasound guidance.  Negative aspiration and negative test dose prior to incremental administration of local anesthetic. The patient tolerated the procedure well.

## 2018-07-07 NOTE — Anesthesia Postprocedure Evaluation (Signed)
Anesthesia Post Note  Patient: Douglas Hoffman  Procedure(s) Performed: TOTAL KNEE REVISION LEFT KNEE ARTHROPLASTY WITH FEMORAL AND TIBIAL COMPONENTS (Left Knee)     Patient location during evaluation: PACU Anesthesia Type: Spinal and Regional Level of consciousness: oriented and awake and alert Pain management: pain level controlled Vital Signs Assessment: post-procedure vital signs reviewed and stable Respiratory status: spontaneous breathing, respiratory function stable and patient connected to nasal cannula oxygen Cardiovascular status: blood pressure returned to baseline and stable Postop Assessment: no headache, no backache, no apparent nausea or vomiting and spinal receding Anesthetic complications: no    Last Vitals:  Vitals:   07/07/18 2100 07/07/18 2115  BP: 108/70 107/64  Pulse: 78 78  Resp: 17 16  Temp: 36.4 C   SpO2: 100% 100%    Last Pain:  Vitals:   07/07/18 2115  TempSrc:   PainSc: 4                  Kaori Jumper P Danaysia Rader

## 2018-07-08 LAB — BASIC METABOLIC PANEL
Anion gap: 6 (ref 5–15)
BUN: 23 mg/dL (ref 8–23)
CO2: 23 mmol/L (ref 22–32)
Calcium: 8.6 mg/dL — ABNORMAL LOW (ref 8.9–10.3)
Chloride: 110 mmol/L (ref 98–111)
Creatinine, Ser: 1.64 mg/dL — ABNORMAL HIGH (ref 0.61–1.24)
GFR calc Af Amer: 48 mL/min — ABNORMAL LOW (ref >60)
GFR calc non Af Amer: 41 mL/min — ABNORMAL LOW (ref >60)
GLUCOSE: 180 mg/dL — AB (ref 70–99)
Potassium: 4.2 mmol/L (ref 3.5–5.1)
Sodium: 139 mmol/L (ref 135–145)

## 2018-07-08 LAB — CBC
HCT: 33.4 % — ABNORMAL LOW (ref 39.0–52.0)
Hemoglobin: 10.3 g/dL — ABNORMAL LOW (ref 13.0–17.0)
MCH: 31.1 pg (ref 26.0–34.0)
MCHC: 30.8 g/dL (ref 30.0–36.0)
MCV: 100.9 fL — AB (ref 80.0–100.0)
Platelets: 120 10*3/uL — ABNORMAL LOW (ref 150–400)
RBC: 3.31 MIL/uL — ABNORMAL LOW (ref 4.22–5.81)
RDW: 12.6 % (ref 11.5–15.5)
WBC: 9.6 10*3/uL (ref 4.0–10.5)
nRBC: 0 % (ref 0.0–0.2)

## 2018-07-08 MED ORDER — SENNA 8.6 MG PO TABS: 2.0000 | ORAL_TABLET | Freq: Every day | ORAL | 1 refills | Status: DC

## 2018-07-08 MED ORDER — DOCUSATE SODIUM 100 MG PO CAPS: 100.0000 mg | ORAL_CAPSULE | Freq: Two times a day (BID) | ORAL | 1 refills | Status: DC

## 2018-07-08 MED ORDER — APIXABAN 2.5 MG PO TABS
2.5000 mg | ORAL_TABLET | Freq: Two times a day (BID) | ORAL | Status: DC
  Administered 2018-07-08 – 2018-07-09 (×3): 2.5 mg via ORAL
  Filled 2018-07-08 (×3): qty 1

## 2018-07-08 MED ORDER — HYDROCODONE-ACETAMINOPHEN 5-325 MG PO TABS: 1.0000 | ORAL_TABLET | ORAL | 0 refills | Status: DC | PRN

## 2018-07-08 MED ORDER — APIXABAN 2.5 MG PO TABS: 2.5000 mg | ORAL_TABLET | Freq: Two times a day (BID) | ORAL | 0 refills | Status: DC

## 2018-07-08 MED ORDER — ONDANSETRON HCL 4 MG PO TABS: 4.0000 mg | ORAL_TABLET | Freq: Four times a day (QID) | ORAL | 0 refills | Status: DC | PRN

## 2018-07-08 NOTE — Progress Notes (Signed)
PT TX NOTE  07/08/18 1400  PT Visit Information  Last PT Received On 07/08/18 Pt progressing toward PT goals  Assistance Needed +1  History of Present Illness s/p revision of L TKA with subsequent Fracture of distal lateral femoral condyle cortical shell; PMH: R THA, gout, HTN  Subjective Data  Patient Stated Goal home Saturday  Precautions  Precautions Fall;Knee  Restrictions  LLE Weight Bearing TWB  Pain Assessment  Pain Assessment 0-10  Pain Score 5  Pain Location left knee  Pain Descriptors / Indicators Discomfort  Pain Intervention(s) Limited activity within patient's tolerance;Monitored during session;Patient requesting pain meds-RN notified  Cognition  Arousal/Alertness Awake/alert  Behavior During Therapy WFL for tasks assessed/performed  Overall Cognitive Status Within Functional Limits for tasks assessed  Bed Mobility  General bed mobility comments in chair  Transfers  Overall transfer level Needs assistance  Equipment used Rolling walker (2 wheeled)  Transfers Sit to/from Stand  Sit to Stand Min guard  General transfer comment cues for hand placement  Ambulation/Gait  Ambulation/Gait assistance Min assist;Min guard  Gait Distance (Feet) 70 Feet  Assistive device Rolling walker (2 wheeled)  Gait Pattern/deviations Step-to pattern  General Gait Details cues for sequence and TDWB  Total Joint Exercises  Ankle Circles/Pumps AROM;Both;10 reps  Quad Sets AROM;Both;10 reps  Heel Slides AAROM;Left;10 reps  Straight Leg Raises AROM;Left;10 reps  Long Arc Quad AROM;Left;5 reps  PT - End of Session  Equipment Utilized During Treatment Gait belt  Activity Tolerance Patient tolerated treatment well  Patient left in chair;with call bell/phone within reach;with chair alarm set;with family/visitor present   PT - Assessment/Plan  PT Plan Current plan remains appropriate  PT Visit Diagnosis Difficulty in walking, not elsewhere classified (R26.2)  PT Frequency (ACUTE ONLY)  7X/week  Follow Up Recommendations Follow surgeon's recommendation for DC plan and follow-up therapies;Outpatient PT  PT equipment None recommended by PT  AM-PAC PT "6 Clicks" Mobility Outcome Measure (Version 2)  Help needed turning from your back to your side while in a flat bed without using bedrails? 3  Help needed moving from lying on your back to sitting on the side of a flat bed without using bedrails? 3  Help needed moving to and from a bed to a chair (including a wheelchair)? 3  Help needed standing up from a chair using your arms (e.g., wheelchair or bedside chair)? 3  Help needed to walk in hospital room? 3  Help needed climbing 3-5 steps with a railing?  3  6 Click Score 18  Consider Recommendation of Discharge To: Home with [facility]  PT Goal Progression  Progress towards PT goals Progressing toward goals  Acute Rehab PT Goals  PT Goal Formulation With patient  Time For Goal Achievement 07/15/18  Potential to Achieve Goals Good  PT Time Calculation  PT Start Time (ACUTE ONLY) 1354  PT Stop Time (ACUTE ONLY) 1410  PT Time Calculation (min) (ACUTE ONLY) 16 min  PT General Charges  $$ ACUTE PT VISIT 1 Visit  PT Treatments  $Therapeutic Exercise 8-22 mins

## 2018-07-08 NOTE — Evaluation (Signed)
Physical Therapy Evaluation Patient Details Name: Douglas Hoffman MRN: [id number] DOB: 1946-04-20 Today's Date: 07/08/2018   History of Present Illness  s/p revision of L TKA with subsequent Fracture of distal lateral femoral condyle cortical shell; PMH: R THA, gout, HTN  Clinical Impression  Pt is s/p TKA resulting in the deficits listed below (see PT Problem List).  Pt Is motivated and should continue to progress well. Pt is hopeful to d/c home Saturday  Pt will benefit from skilled PT to increase their independence and safety with mobility to allow discharge to the venue listed below.      Follow Up Recommendations Follow surgeon's recommendation for DC plan and follow-up therapies;Outpatient PT    Equipment Recommendations  None recommended by PT    Recommendations for Other Services       Precautions / Restrictions Precautions Precautions: Fall Restrictions Weight Bearing Restrictions: Yes LLE Weight Bearing: Touchdown weight bearing      Mobility  Bed Mobility Overal bed mobility: Needs Assistance Bed Mobility: Supine to Sit     Supine to sit: Supervision     General bed mobility comments: for safety  Transfers Overall transfer level: Needs assistance Equipment used: Rolling walker (2 wheeled) Transfers: Sit to/from Stand Sit to Stand: Min guard         General transfer comment: cues for hand placement  Ambulation/Gait Ambulation/Gait assistance: Min assist;Min guard Gait Distance (Feet): 80 Feet Assistive device: Rolling walker (2 wheeled) Gait Pattern/deviations: Step-to pattern     General Gait Details: cues for sequence and TDWB  Stairs            Wheelchair Mobility    Modified Rankin (Stroke Patients Only)       Balance                                             Pertinent Vitals/Pain Pain Assessment: 0-10 Pain Score: 1  Pain Location: left knee Pain Descriptors / Indicators: Discomfort Pain  Intervention(s): Monitored during session;Limited activity within patient's tolerance;Premedicated before session    Home Living Family/patient expects to be discharged to:: Private residence Living Arrangements: Spouse/significant other Available Help at Discharge: Available PRN/intermittently;Family Type of Home: House Home Access: Stairs to enter Entrance Stairs-Rails: Can reach both Entrance Stairs-Number of Steps: 5 Home Layout: Able to live on main level with bedroom/bathroom;Multi-level Home Equipment: Walker - 2 wheels;Wheelchair - Psychologist, educational;Bedside commode      Prior Function                 Hand Dominance        Extremity/Trunk Assessment   Upper Extremity Assessment Upper Extremity Assessment: Overall WFL for tasks assessed    Lower Extremity Assessment Lower Extremity Assessment: LLE deficits/detail LLE Deficits / Details: knee extension and hip flexion 3/5; AAROM knee flexion ~10* to 60*       Communication   Communication: No difficulties  Cognition Arousal/Alertness: Awake/alert Behavior During Therapy: WFL for tasks assessed/performed Overall Cognitive Status: Within Functional Limits for tasks assessed                                        General Comments      Exercises     Assessment/Plan    PT Assessment Patient needs continued PT services  PT Problem List Decreased range of motion;Decreased strength;Decreased activity tolerance;Pain;Decreased mobility;Decreased knowledge of use of DME       PT Treatment Interventions Gait training;DME instruction;Functional mobility training;Therapeutic activities;Patient/family education;Stair training;Therapeutic exercise    PT Goals (Current goals can be found in the Care Plan section)  Acute Rehab PT Goals Patient Stated Goal: home Saturday PT Goal Formulation: With patient Time For Goal Achievement: 07/15/18 Potential to Achieve Goals: Good    Frequency  7X/week   Barriers to discharge        Co-evaluation               AM-PAC PT "6 Clicks" Mobility  Outcome Measure Help needed turning from your back to your side while in a flat bed without using bedrails?: A Little Help needed moving from lying on your back to sitting on the side of a flat bed without using bedrails?: A Little Help needed moving to and from a bed to a chair (including a wheelchair)?: A Little Help needed standing up from a chair using your arms (e.g., wheelchair or bedside chair)?: A Little Help needed to walk in hospital room?: A Little Help needed climbing 3-5 steps with a railing? : A Little 6 Click Score: 18    End of Session Equipment Utilized During Treatment: Gait belt Activity Tolerance: Patient tolerated treatment well Patient left: in chair;with call bell/phone within reach;with chair alarm set   PT Visit Diagnosis: Difficulty in walking, not elsewhere classified (R26.2)    Time: 0037-9444 PT Time Calculation (min) (ACUTE ONLY): 21 min   Charges:   PT Evaluation $PT Eval Low Complexity: 1 Low          Kenyon Ana, PT  Pager: [phone] Acute Rehab Dept [facility]): [phone]   07/08/2018   [facility] 07/08/2018, 11:38 AM

## 2018-07-08 NOTE — Discharge Summary (Signed)
Physician Discharge Summary  Patient ID: Douglas Hoffman MRN: [id number] DOB/AGE: 07-Mar-1946 73 y.o.  Admit date: 07/07/2018 Discharge date: 07/09/2018  Admission Diagnoses:  Failed total knee, left [facility])  Discharge Diagnoses:  Principal Problem:   Failed total knee, left ([place]) Active Problems:   Failed total knee, left, initial encounter [facility])   Past Medical History:  Diagnosis Date  . Acute venous embolism and thrombosis of unspecified deep vessels of lower extremity   . Cancer ([place])   . Glaucoma    left eye since age 89  . Gout   . Hip pain   . History of radiation therapy    Prostate  . Hypertension   . Prostate cancer ([place])   . Renal disorder    reports prior to joint replacement surgery , was taking 5 to 6 aleve daily for pain releif, report shas stopped this and kidney function has omporved, kidney fx followed by his PCP       Surgeries: Procedure(s): TOTAL KNEE REVISION LEFT KNEE ARTHROPLASTY WITH FEMORAL AND TIBIAL COMPONENTS on 07/07/2018   Consultants (if any):   Discharged Condition: Improved  Hospital Course: Douglas Hoffman is an 73 y.o. male who was admitted 07/07/2018 with a diagnosis of Failed total knee, left ([place]) and went to the operating room on 07/07/2018 and underwent the above named procedures.    He was given perioperative antibiotics:  Anti-infectives (From admission, onward)   Start     Dose/Rate Route Frequency Ordered Stop   07/07/18 2130  ceFAZolin (ANCEF) IVPB 2g/100 mL premix     2 g 200 mL/hr over 30 Minutes Intravenous Every 6 hours 07/07/18 2129 07/08/18 0434   07/07/18 1015  ceFAZolin (ANCEF) IVPB 2g/100 mL premix     2 g 200 mL/hr over 30 Minutes Intravenous On call to O.R. 07/07/18 1006 07/07/18 1558    .  He was made touch down weight bearing left lower extremity. He worked with physical therapy.  He was given sequential compression devices, early ambulation, and apixaban for DVT prophylaxis.  He benefited maximally from the hospital  stay and there were no complications.    Recent vital signs:  Vitals:   07/08/18 2237 07/09/18 0635  BP: 130/71 134/89  Pulse: 74 75  Resp: 15 15  Temp: 98 F (36.7 C) (!) 97.3 F (36.3 C)  SpO2: 99% 100%    Recent laboratory studies:  Lab Results  Component Value Date   HGB 8.8 (L) 07/09/2018   HGB 10.3 (L) 07/08/2018   HGB 11.6 (L) 07/07/2018   Lab Results  Component Value Date   WBC 13.7 (H) 07/09/2018   PLT 116 (L) 07/09/2018   Lab Results  Component Value Date   INR 4.38 (H) 11/02/2011   Lab Results  Component Value Date   NA 139 07/08/2018   K 4.2 07/08/2018   CL 110 07/08/2018   CO2 23 07/08/2018   BUN 23 07/08/2018   CREATININE 1.64 (H) 07/08/2018   GLUCOSE 180 (H) 07/08/2018    Discharge Medications:   Allergies as of 07/09/2018   No Known Allergies     Medication List    STOP taking these medications   aspirin EC 81 MG tablet   colchicine 0.6 MG tablet   predniSONE 20 MG tablet Commonly known as:  DELTASONE   warfarin 2.5 MG tablet Commonly known as:  COUMADIN     TAKE these medications   allopurinol 100 MG tablet Commonly known as:  ZYLOPRIM Take 100 mg by  mouth daily.   amLODipine 5 MG tablet Commonly known as:  NORVASC Take 5 mg by mouth daily.   apixaban 2.5 MG Tabs tablet Commonly known as:  ELIQUIS Take 1 tablet (2.5 mg total) by mouth 2 (two) times daily.   cholecalciferol 1000 units tablet Commonly known as:  VITAMIN D Take 1,000 Units by mouth daily.   docusate sodium 100 MG capsule Commonly known as:  COLACE Take 1 capsule (100 mg total) by mouth 2 (two) times daily.   HYDROcodone-acetaminophen 5-325 MG tablet Commonly known as:  NORCO/VICODIN Take 1 tablet by mouth every 4 (four) hours as needed for moderate pain (pain score 4-6).   lisinopril 20 MG tablet Commonly known as:  PRINIVIL,ZESTRIL Take 20 mg by mouth daily.   ondansetron 4 MG tablet Commonly known as:  ZOFRAN Take 1 tablet (4 mg total) by mouth  every 6 (six) hours as needed for nausea.   senna 8.6 MG Tabs tablet Commonly known as:  SENOKOT Take 2 tablets (17.2 mg total) by mouth at bedtime.       Diagnostic Studies: Dg Knee Left Port  Result Date: 07/07/2018 CLINICAL DATA:  Total left knee revision. EXAM: PORTABLE LEFT KNEE - 1-2 VIEW COMPARISON:  None. FINDINGS: Revision arthroplasty in expected alignment. No periprosthetic lucency or fracture. There is been patellar resurfacing. Recent postsurgical change includes skin staples, edema and air in the subcutaneous tissues and small joint effusion. A drain is in place. IMPRESSION: Revision left knee arthroplasty without immediate postoperative complication. Electronically Signed   By: Keith Rake M.D.   On: 07/07/2018 21:13    Disposition: Discharge disposition: 01-Home or Self Care       Discharge Instructions    Call MD / Call 911   Complete by:  As directed    If you experience chest pain or shortness of breath, CALL 911 and be transported to the hospital emergency room.  If you develope a fever above 101 F, pus (white drainage) or increased drainage or redness at the wound, or calf pain, call your surgeon's office.   Constipation Prevention   Complete by:  As directed    Drink plenty of fluids.  Prune juice may be helpful.  You may use a stool softener, such as Colace (over the counter) 100 mg twice a day.  Use MiraLax (over the counter) for constipation as needed.   Diet - low sodium heart healthy   Complete by:  As directed    Discharge instructions   Complete by:  As directed    Touch down weight bearing left leg   Do not put a pillow under the knee. Place it under the heel.   Complete by:  As directed    Driving restrictions   Complete by:  As directed    No driving for 6 weeks   Increase activity slowly as tolerated   Complete by:  As directed    Lifting restrictions   Complete by:  As directed    No lifting for 12 weeks   TED hose   Complete by:  As  directed    Use stockings (TED hose) for 2 weeks on both leg(s).  You may remove them at night for sleeping.      Follow-up Information    Beldon Nowling, Aaron Edelman, MD. Schedule an appointment as soon as possible for a visit in 2 weeks.   Specialty:  Orthopedic Surgery Why:  For suture removal, For wound re-check Contact information: [address] [place] [address] [phone]            Signed: Hilton Cork Kinslei Labine 07/09/2018, 8:57 AM

## 2018-07-08 NOTE — Progress Notes (Signed)
    Subjective:  Patient reports pain as mild to moderate.  Denies N/V/CP/SOB. No c/o.  Objective:   VITALS:   Vitals:   07/07/18 2334 07/08/18 0029 07/08/18 0402 07/08/18 0856  BP: 133/80 109/67 102/63 107/64  Pulse: 72 68 76 74  Resp: 16 16 16 16   Temp: (!) 97.5 F (36.4 C) (!) 97.5 F (36.4 C) 97.6 F (36.4 C) 98 F (36.7 C)  TempSrc: Oral Oral Oral   SpO2: 100% 100% 100% 100%  Weight:      Height:        NAD ABD soft Sensation intact distally Intact pulses distally Dorsiflexion/Plantar flexion intact Incision: dressing C/D/I Compartment soft Able to SLR HV ss   Lab Results  Component Value Date   WBC 9.6 07/08/2018   HGB 10.3 (L) 07/08/2018   HCT 33.4 (L) 07/08/2018   MCV 100.9 (H) 07/08/2018   PLT 120 (L) 07/08/2018   BMET    Component Value Date/Time   NA 139 07/08/2018 0511   K 4.2 07/08/2018 0511   CL 110 07/08/2018 0511   CO2 23 07/08/2018 0511   GLUCOSE 180 (H) 07/08/2018 0511   BUN 23 07/08/2018 0511   CREATININE 1.64 (H) 07/08/2018 0511   CALCIUM 8.6 (L) 07/08/2018 0511   GFRNONAA 41 (L) 07/08/2018 0511   GFRAA 48 (L) 07/08/2018 0511    Recent Results (from the past 240 hour(s))  Surgical pcr screen     Status: Abnormal   Collection Time: 06/30/18  8:08 AM  Result Value Ref Range Status   MRSA, PCR NEGATIVE NEGATIVE Final   Staphylococcus aureus POSITIVE (A) NEGATIVE Final    Comment: (NOTE) The Xpert SA Assay (FDA approved for NASAL specimens in patients 57 years of age and older), is one component of a comprehensive surveillance program. It is not intended to diagnose infection nor to guide or monitor treatment. Performed at [facility], [place] [address], [place], [place] [address]   Aerobic/Anaerobic Culture (surgical/deep wound)     Status: None (Preliminary result)   Collection Time: 07/07/18  4:41 PM  Result Value Ref Range Status   Specimen Description   Final    TISSUE SOFT TISSUE FAILED LEFT TOTAL KNEE  ARTHROPLASTY Performed at [facility] [address], [place], [place] [address]    Special Requests   Final    NONE Performed at [facility], [place] [address] [place], [place] [address]    Gram Stain   Final    NO WBC SEEN NO ORGANISMS SEEN Performed at [facility], [place] [address], [place], [place] [address]    Culture PENDING  Incomplete   Report Status PENDING  Incomplete      Assessment/Plan: 1 Day Post-Op   Principal Problem:   Failed total knee, left ([place]) Active Problems:   Failed total knee, left, initial encounter ([place])   TDWB with walker DVT ppx: apixaban, SCDs, TEDS PO pain control PT/OT Dispo: follow intraop culture, d/c home tomorrow with [place] Karen Huhta 07/08/2018, 10:25 AM   Rod Can, MD Cell: [phone] [place] is now [facility]  [place] [address] [place], [place], [place] [address] Phone: [phone] [email or web address] Facebook  [employer]

## 2018-07-08 NOTE — Plan of Care (Signed)
  Problem: Education: Goal: Knowledge of General Education information will improve Description Including pain rating scale, medication(s)/side effects and non-pharmacologic comfort measures Outcome: Progressing   Problem: Health Behavior/Discharge Planning: Goal: Ability to manage health-related needs will improve Outcome: Progressing   Problem: Clinical Measurements: Goal: Ability to maintain clinical measurements within normal limits will improve Outcome: Progressing   Problem: Nutrition: Goal: Adequate nutrition will be maintained Outcome: Progressing   Problem: Pain Managment: Goal: General experience of comfort will improve Outcome: Progressing   Problem: Activity: Goal: Ability to avoid complications of mobility impairment will improve Outcome: Progressing   Problem: Pain Management: Goal: Pain level will decrease with appropriate interventions Outcome: Progressing

## 2018-07-08 NOTE — Care Management Note (Signed)
Case Management Note  Patient Details  Name: Douglas Hoffman MRN: [id number] Date of Birth: 1946-04-30  Subjective/Objective:                  Discharge planning  Action/Plan: Match program completed and given to patient  Expected Discharge Date:   07/09/2018               Expected Discharge Plan:     In-House Referral:     Discharge planning Services  CM Consult, [facility] Program  Post Acute Care Choice:    Choice offered to:     DME Arranged:    DME Agency:     HH Arranged:    [place] Agency:     Status of Service:  In process, will continue to follow  If discussed at Long Length of Stay Meetings, dates discussed:    Additional Comments:  Leeroy Cha, RN 07/08/2018, 2:05 PM

## 2018-07-08 NOTE — Discharge Instructions (Signed)
Dr. Rod Can Total Joint Specialist [facility] [address], [place], [place] [address] [phone]  TOTAL KNEE REPLACEMENT POSTOPERATIVE DIRECTIONS    Knee Rehabilitation, Guidelines Following Surgery  Results after knee surgery are often greatly improved when you follow the exercise, range of motion and muscle strengthening exercises prescribed by your doctor. Safety measures are also important to protect the knee from further injury. Any time any of these exercises cause you to have increased pain or swelling in your knee joint, decrease the amount until you are comfortable again and slowly increase them. If you have problems or questions, call your caregiver or physical therapist for advice.   WEIGHT BEARING Partial weight bearing with assist device as directed.  touch down weight bearing left leg with walker  [place] items at home which could result in a fall. This includes throw rugs or furniture in walking pathways.  Continue medications as instructed at time of discharge. You may have some home medications which will be placed on hold until you complete the course of blood thinner medication.  You may start showering once you are discharged home but do not submerge the incision under water. Just pat the incision dry and apply a dry gauze dressing on daily. Walk with walker as instructed.  You may resume a sexual relationship in one month or when given the OK by your doctor.   Use walker as long as suggested by your caregivers.  Avoid periods of inactivity such as sitting longer than an hour when not asleep. This helps prevent blood clots.  You may put full weight on your legs and walk as much as is comfortable.  You may return to work once you are cleared by your doctor.  Do not drive a car for 6 weeks or until released by you surgeon.   Do not drive while taking narcotics.  Wear the elastic stockings for three weeks  following surgery during the day but you may remove then at night. Make sure you keep all of your appointments after your operation with all of your doctors and caregivers. You should call the office at the above phone number and make an appointment for approximately two weeks after the date of your surgery. Do not remove your surgical dressing. The dressing is waterproof; you may take showers in 3 days, but do not take tub baths or submerge the dressing. Please pick up a stool softener and laxative for home use as long as you are requiring pain medications.  ICE to the affected knee every three hours for 30 minutes at a time and then as needed for pain and swelling.  Continue to use ice on the knee for pain and swelling from surgery. You may notice swelling that will progress down to the foot and ankle.  This is normal after surgery.  Elevate the leg when you are not up walking on it.   It is important for you to complete the blood thinner medication as prescribed by your doctor.  Continue to use the breathing machine which will help keep your temperature down.  It is common for your temperature to cycle up and down following surgery, especially at night when you are not up moving around and exerting yourself.  The breathing machine keeps your lungs expanded and your temperature down.  RANGE OF MOTION AND STRENGTHENING EXERCISES  Rehabilitation of the knee is important following a knee injury or an operation. After just a few days  of immobilization, the muscles of the thigh which control the knee become weakened and shrink (atrophy). Knee exercises are designed to build up the tone and strength of the thigh muscles and to improve knee motion. Often times heat used for twenty to thirty minutes before working out will loosen up your tissues and help with improving the range of motion but do not use heat for the first two weeks following surgery. These exercises can be done on a training (exercise) mat, on  the floor, on a table or on a bed. Use what ever works the best and is most comfortable for you Knee exercises include:  Leg Lifts - While your knee is still immobilized in a splint or cast, you can do straight leg raises. Lift the leg to 60 degrees, hold for 3 sec, and slowly lower the leg. Repeat 10-20 times 2-3 times daily. Perform this exercise against resistance later as your knee gets better.  Quad and Hamstring Sets - Tighten up the muscle on the front of the thigh (Quad) and hold for 5-10 sec. Repeat this 10-20 times hourly. Hamstring sets are done by pushing the foot backward against an object and holding for 5-10 sec. Repeat as with quad sets.  A rehabilitation program following serious knee injuries can speed recovery and prevent re-injury in the future due to weakened muscles. Contact your doctor or a physical therapist for more information on knee rehabilitation.   SKILLED REHAB INSTRUCTIONS: If the patient is transferred to a skilled rehab facility following release from the hospital, a list of the current medications will be sent to the facility for the patient to continue.  When discharged from the skilled rehab facility, please have the facility set up the patient's [place] prior to being released. Also, the skilled facility will be responsible for providing the patient with their medications at time of release from the facility to include their pain medication, the muscle relaxants, and their blood thinner medication. If the patient is still at the rehab facility at time of the two week follow up appointment, the skilled rehab facility will also need to assist the patient in arranging follow up appointment in our office and any transportation needs.  MAKE SURE YOU:  Understand these instructions.  Will watch your condition.  Will get help right away if you are not doing well or get worse.    Pick up stool softner and laxative for home use following surgery while  on pain medications. Do NOT remove your dressing. You may shower.  Do not take tub baths or submerge incision under water. May shower starting three days after surgery. Please use a clean towel to pat the incision dry following showers. Continue to use ice for pain and swelling after surgery. Do not use any lotions or creams on the incision until instructed by your surgeon.   Information on my medicine - ELIQUIS (apixaban)  This medication education was reviewed with me or my healthcare representative as part of my discharge preparation.  The pharmacist that spoke with me during my hospital stay was:  Gaylan Gerold, Student-PharmD  Why was Eliquis prescribed for you? Eliquis was prescribed for you to reduce the risk of blood clots forming after orthopedic surgery.    What do You need to know about Eliquis? Take your Eliquis TWICE DAILY - one tablet in the morning and one tablet in the evening with or without food.  It would be best to take the dose about the  same time each day.  If you have difficulty swallowing the tablet whole please discuss with your pharmacist how to take the medication safely.  Take Eliquis exactly as prescribed by your doctor and DO NOT stop taking Eliquis without talking to the doctor who prescribed the medication.  Stopping without other medication to take the place of Eliquis may increase your risk of developing a clot.  After discharge, you should have regular check-up appointments with your healthcare provider that is prescribing your Eliquis.  What do you do if you miss a dose? If a dose of ELIQUIS is not taken at the scheduled time, take it as soon as possible on the same day and twice-daily administration should be resumed.  The dose should not be doubled to make up for a missed dose.  Do not take more than one tablet of ELIQUIS at the same time.  Important Safety Information A possible side effect of Eliquis is bleeding. You should call your  healthcare provider right away if you experience any of the following: ? Bleeding from an injury or your nose that does not stop. ? Unusual colored urine (red or dark brown) or unusual colored stools (red or black). ? Unusual bruising for unknown reasons. ? A serious fall or if you hit your head (even if there is no bleeding).  Some medicines may interact with Eliquis and might increase your risk of bleeding or clotting while on Eliquis. To help avoid this, consult your healthcare provider or pharmacist prior to using any new prescription or non-prescription medications, including herbals, vitamins, non-steroidal anti-inflammatory drugs (NSAIDs) and supplements.  This website has more information on Eliquis (apixaban): http://[email or web address].com/eliquis/home

## 2018-07-09 LAB — CBC
HCT: 27.9 % — ABNORMAL LOW (ref 39.0–52.0)
HEMOGLOBIN: 8.8 g/dL — AB (ref 13.0–17.0)
MCH: 31.3 pg (ref 26.0–34.0)
MCHC: 31.5 g/dL (ref 30.0–36.0)
MCV: 99.3 fL (ref 80.0–100.0)
Platelets: 116 10*3/uL — ABNORMAL LOW (ref 150–400)
RBC: 2.81 MIL/uL — ABNORMAL LOW (ref 4.22–5.81)
RDW: 13 % (ref 11.5–15.5)
WBC: 13.7 10*3/uL — ABNORMAL HIGH (ref 4.0–10.5)
nRBC: 0 % (ref 0.0–0.2)

## 2018-07-09 NOTE — Progress Notes (Signed)
Physical Therapy Treatment Patient Details Name: Douglas Hoffman MRN: [id number] DOB: 1945-07-07 Today's Date: 07/09/2018    History of Present Illness s/p revision of L TKA with subsequent Fracture of distal lateral femoral condyle cortical shell; PMH: R THA, gout, HTN    PT Comments    Pt is progressing very well; ready for d/c from PT standpoint  Follow Up Recommendations  Follow surgeon's recommendation for DC plan and follow-up therapies;Outpatient PT     Equipment Recommendations  None recommended by PT    Recommendations for Other Services       Precautions / Restrictions Precautions Precautions: Fall;Knee Restrictions Weight Bearing Restrictions: Yes LLE Weight Bearing: Touchdown weight bearing    Mobility  Bed Mobility               General bed mobility comments: in chair  Transfers Overall transfer level: Needs assistance Equipment used: Rolling walker (2 wheeled) Transfers: Sit to/from Stand Sit to Stand: Supervision         General transfer comment: cues for hand placement  Ambulation/Gait Ambulation/Gait assistance: Supervision Gait Distance (Feet): 50 Feet Assistive device: Rolling walker (2 wheeled) Gait Pattern/deviations: Step-to pattern     General Gait Details: cues for sequence and TDWB   Stairs Stairs: Yes Stairs assistance: Min guard Stair Management: Two rails;Step to pattern Number of Stairs: 5 General stair comments: cues for sequence and technique   Wheelchair Mobility    Modified Rankin (Stroke Patients Only)       Balance                                            Cognition Arousal/Alertness: Awake/alert Behavior During Therapy: WFL for tasks assessed/performed Overall Cognitive Status: Within Functional Limits for tasks assessed                                        Exercises Total Joint Exercises Ankle Circles/Pumps: AROM;Both;10 reps Quad Sets: AROM;Both;10  reps Heel Slides: AAROM;Left;10 reps Hip ABduction/ADduction: AROM;Right;10 reps Straight Leg Raises: AROM;Left;10 reps Long Arc Quad: AROM;Left;5 reps    General Comments        Pertinent Vitals/Pain Pain Assessment: 0-10 Pain Score: 4  Pain Location: left knee Pain Descriptors / Indicators: Discomfort Pain Intervention(s): Limited activity within patient's tolerance;Monitored during session    Home Living                      Prior Function            PT Goals (current goals can now be found in the care plan section) Acute Rehab PT Goals Patient Stated Goal: home Saturday PT Goal Formulation: With patient Time For Goal Achievement: 07/15/18 Potential to Achieve Goals: Good Progress towards PT goals: Progressing toward goals    Frequency    7X/week      PT Plan Current plan remains appropriate    Co-evaluation              AM-PAC PT "6 Clicks" Mobility   Outcome Measure  Help needed turning from your back to your side while in a flat bed without using bedrails?: A Little Help needed moving from lying on your back to sitting on the side of a flat bed without using bedrails?: A Little Help  needed moving to and from a bed to a chair (including a wheelchair)?: A Little Help needed standing up from a chair using your arms (e.g., wheelchair or bedside chair)?: A Little Help needed to walk in hospital room?: A Little Help needed climbing 3-5 steps with a railing? : A Little 6 Click Score: 18    End of Session Equipment Utilized During Treatment: Gait belt Activity Tolerance: Patient tolerated treatment well Patient left: in chair;with call bell/phone within reach;with chair alarm set;with family/visitor present   PT Visit Diagnosis: Difficulty in walking, not elsewhere classified (R26.2)     Time: 1638-4536 PT Time Calculation (min) (ACUTE ONLY): 42 min  Charges:  $Gait Training: 23-37 mins $Therapeutic Exercise: 8-22 mins                      Kenyon Ana, PT  Pager: [phone] Acute Rehab Dept [facility]): [phone]   07/09/2018    [facility] 07/09/2018, 11:14 AM

## 2018-07-09 NOTE — Progress Notes (Signed)
    Subjective:  Patient reports pain as mild to moderate.  Denies N/V/CP/SOB. No c/o.  Objective:   VITALS:   Vitals:   07/08/18 0856 07/08/18 1321 07/08/18 2237 07/09/18 0635  BP: 107/64 127/79 130/71 134/89  Pulse: 74 80 74 75  Resp: 16 16 15 15   Temp: 98 F (36.7 C) (!) 97.4 F (36.3 C) 98 F (36.7 C) (!) 97.3 F (36.3 C)  TempSrc:  Oral Oral Oral  SpO2: 100% 100% 99% 100%  Weight:      Height:        NAD ABD soft Sensation intact distally Intact pulses distally Dorsiflexion/Plantar flexion intact Incision: dressing C/D/I Compartment soft Able to SLR    Lab Results  Component Value Date   WBC 13.7 (H) 07/09/2018   HGB 8.8 (L) 07/09/2018   HCT 27.9 (L) 07/09/2018   MCV 99.3 07/09/2018   PLT 116 (L) 07/09/2018   BMET    Component Value Date/Time   NA 139 07/08/2018 0511   K 4.2 07/08/2018 0511   CL 110 07/08/2018 0511   CO2 23 07/08/2018 0511   GLUCOSE 180 (H) 07/08/2018 0511   BUN 23 07/08/2018 0511   CREATININE 1.64 (H) 07/08/2018 0511   CALCIUM 8.6 (L) 07/08/2018 0511   GFRNONAA 41 (L) 07/08/2018 0511   GFRAA 48 (L) 07/08/2018 0511    Recent Results (from the past 240 hour(s))  Surgical pcr screen     Status: Abnormal   Collection Time: 06/30/18  8:08 AM  Result Value Ref Range Status   MRSA, PCR NEGATIVE NEGATIVE Final   Staphylococcus aureus POSITIVE (A) NEGATIVE Final    Comment: (NOTE) The Xpert SA Assay (FDA approved for NASAL specimens in patients 13 years of age and older), is one component of a comprehensive surveillance program. It is not intended to diagnose infection nor to guide or monitor treatment. Performed at [facility], [place] [address] [place], [place] [address]   Aerobic/Anaerobic Culture (surgical/deep wound)     Status: None (Preliminary result)   Collection Time: 07/07/18  4:41 PM  Result Value Ref Range Status   Specimen Description   Final    TISSUE SOFT TISSUE FAILED LEFT TOTAL KNEE  ARTHROPLASTY Performed at [facility] [address] [place], [place] [address]    Special Requests   Final    NONE Performed at [facility], [place] [address], [place], [place] [address]    Gram Stain NO WBC SEEN NO ORGANISMS SEEN   Final   Culture   Final    NO GROWTH < 24 HOURS NO ANAEROBES ISOLATED; CULTURE IN PROGRESS FOR 5 DAYS Performed at [facility] [address] [place], [place] [address]    Report Status PENDING  Incomplete      Assessment/Plan: 2 Days Post-Op   Principal Problem:   Failed total knee, left ([place]) Active Problems:   Failed total knee, left, initial encounter ([place])   TDWB with walker DVT ppx: apixaban, SCDs, TEDS PO pain control PT/OT Dispo: follow intraop culture, d/c home with OPPT   Hilton Cork Kaylinn Dedic 07/09/2018, 8:46 AM   Rod Can, MD Cell: [phone] [facility] now [facility]  [place] [address], [place], [place], [place] [address] Phone: [phone] [email or web address] Facebook  [employer]

## 2018-07-11 DIAGNOSIS — M25662 Stiffness of left knee, not elsewhere classified: Secondary | ICD-10-CM | POA: Diagnosis not present

## 2018-07-12 ENCOUNTER — Encounter (HOSPITAL_COMMUNITY): Payer: Self-pay | Admitting: Orthopedic Surgery

## 2018-07-13 DIAGNOSIS — M25669 Stiffness of unspecified knee, not elsewhere classified: Secondary | ICD-10-CM | POA: Diagnosis not present

## 2018-07-13 LAB — AEROBIC/ANAEROBIC CULTURE W GRAM STAIN (SURGICAL/DEEP WOUND)
Culture: NO GROWTH
Gram Stain: NONE SEEN

## 2018-07-19 DIAGNOSIS — M25669 Stiffness of unspecified knee, not elsewhere classified: Secondary | ICD-10-CM | POA: Diagnosis not present

## 2018-07-21 DIAGNOSIS — M25662 Stiffness of left knee, not elsewhere classified: Secondary | ICD-10-CM | POA: Diagnosis not present

## 2018-07-25 DIAGNOSIS — M25662 Stiffness of left knee, not elsewhere classified: Secondary | ICD-10-CM | POA: Diagnosis not present

## 2018-07-25 DIAGNOSIS — Z471 Aftercare following joint replacement surgery: Secondary | ICD-10-CM | POA: Diagnosis not present

## 2018-07-25 DIAGNOSIS — Z96652 Presence of left artificial knee joint: Secondary | ICD-10-CM | POA: Diagnosis not present

## 2018-07-28 DIAGNOSIS — M25669 Stiffness of unspecified knee, not elsewhere classified: Secondary | ICD-10-CM | POA: Diagnosis not present

## 2018-08-02 DIAGNOSIS — M25669 Stiffness of unspecified knee, not elsewhere classified: Secondary | ICD-10-CM | POA: Diagnosis not present

## 2018-08-04 DIAGNOSIS — I10 Essential (primary) hypertension: Secondary | ICD-10-CM | POA: Diagnosis not present

## 2018-08-04 DIAGNOSIS — E7800 Pure hypercholesterolemia, unspecified: Secondary | ICD-10-CM | POA: Diagnosis not present

## 2018-08-04 DIAGNOSIS — Z20828 Contact with and (suspected) exposure to other viral communicable diseases: Secondary | ICD-10-CM | POA: Diagnosis not present

## 2018-08-04 DIAGNOSIS — R739 Hyperglycemia, unspecified: Secondary | ICD-10-CM | POA: Diagnosis not present

## 2018-08-04 DIAGNOSIS — Z01812 Encounter for preprocedural laboratory examination: Secondary | ICD-10-CM | POA: Diagnosis not present

## 2018-08-04 DIAGNOSIS — G7000 Myasthenia gravis without (acute) exacerbation: Secondary | ICD-10-CM | POA: Diagnosis not present

## 2018-08-04 DIAGNOSIS — M25669 Stiffness of unspecified knee, not elsewhere classified: Secondary | ICD-10-CM | POA: Diagnosis not present

## 2018-08-23 DIAGNOSIS — Z471 Aftercare following joint replacement surgery: Secondary | ICD-10-CM | POA: Diagnosis not present

## 2018-08-23 DIAGNOSIS — Z96652 Presence of left artificial knee joint: Secondary | ICD-10-CM | POA: Diagnosis not present

## 2018-08-30 DIAGNOSIS — M25669 Stiffness of unspecified knee, not elsewhere classified: Secondary | ICD-10-CM | POA: Diagnosis not present

## 2018-08-31 DIAGNOSIS — E291 Testicular hypofunction: Secondary | ICD-10-CM | POA: Diagnosis not present

## 2018-08-31 DIAGNOSIS — M109 Gout, unspecified: Secondary | ICD-10-CM | POA: Diagnosis not present

## 2018-08-31 DIAGNOSIS — I10 Essential (primary) hypertension: Secondary | ICD-10-CM | POA: Diagnosis not present

## 2018-08-31 DIAGNOSIS — Z125 Encounter for screening for malignant neoplasm of prostate: Secondary | ICD-10-CM | POA: Diagnosis not present

## 2018-08-31 DIAGNOSIS — E781 Pure hyperglyceridemia: Secondary | ICD-10-CM | POA: Diagnosis not present

## 2018-09-01 DIAGNOSIS — R82998 Other abnormal findings in urine: Secondary | ICD-10-CM | POA: Diagnosis not present

## 2018-09-06 DIAGNOSIS — M25669 Stiffness of unspecified knee, not elsewhere classified: Secondary | ICD-10-CM | POA: Diagnosis not present

## 2018-09-07 DIAGNOSIS — M109 Gout, unspecified: Secondary | ICD-10-CM | POA: Diagnosis not present

## 2018-09-07 DIAGNOSIS — Z1331 Encounter for screening for depression: Secondary | ICD-10-CM | POA: Diagnosis not present

## 2018-09-07 DIAGNOSIS — I129 Hypertensive chronic kidney disease with stage 1 through stage 4 chronic kidney disease, or unspecified chronic kidney disease: Secondary | ICD-10-CM | POA: Diagnosis not present

## 2018-09-07 DIAGNOSIS — Z0000 Encounter for general adult medical examination without abnormal findings: Secondary | ICD-10-CM | POA: Diagnosis not present

## 2018-09-07 DIAGNOSIS — N3020 Other chronic cystitis without hematuria: Secondary | ICD-10-CM | POA: Diagnosis not present

## 2018-09-07 DIAGNOSIS — Z8546 Personal history of malignant neoplasm of prostate: Secondary | ICD-10-CM | POA: Diagnosis not present

## 2018-09-07 DIAGNOSIS — M13862 Other specified arthritis, left knee: Secondary | ICD-10-CM | POA: Diagnosis not present

## 2018-09-07 DIAGNOSIS — E781 Pure hyperglyceridemia: Secondary | ICD-10-CM | POA: Diagnosis not present

## 2018-09-07 DIAGNOSIS — F5221 Male erectile disorder: Secondary | ICD-10-CM | POA: Diagnosis not present

## 2018-09-07 DIAGNOSIS — E291 Testicular hypofunction: Secondary | ICD-10-CM | POA: Diagnosis not present

## 2018-09-07 DIAGNOSIS — N183 Chronic kidney disease, stage 3 (moderate): Secondary | ICD-10-CM | POA: Diagnosis not present

## 2018-09-12 DIAGNOSIS — I10 Essential (primary) hypertension: Secondary | ICD-10-CM | POA: Diagnosis not present

## 2018-09-16 DIAGNOSIS — M25669 Stiffness of unspecified knee, not elsewhere classified: Secondary | ICD-10-CM | POA: Diagnosis not present

## 2018-09-22 DIAGNOSIS — M25669 Stiffness of unspecified knee, not elsewhere classified: Secondary | ICD-10-CM | POA: Diagnosis not present

## 2018-09-23 DIAGNOSIS — L259 Unspecified contact dermatitis, unspecified cause: Secondary | ICD-10-CM | POA: Diagnosis not present

## 2018-09-23 DIAGNOSIS — L03116 Cellulitis of left lower limb: Secondary | ICD-10-CM | POA: Diagnosis not present

## 2018-09-28 DIAGNOSIS — D6489 Other specified anemias: Secondary | ICD-10-CM | POA: Diagnosis not present

## 2018-10-03 DIAGNOSIS — M25669 Stiffness of unspecified knee, not elsewhere classified: Secondary | ICD-10-CM | POA: Diagnosis not present

## 2018-10-04 DIAGNOSIS — Z96652 Presence of left artificial knee joint: Secondary | ICD-10-CM | POA: Diagnosis not present

## 2018-10-04 DIAGNOSIS — Z471 Aftercare following joint replacement surgery: Secondary | ICD-10-CM | POA: Diagnosis not present

## 2018-11-25 DIAGNOSIS — L239 Allergic contact dermatitis, unspecified cause: Secondary | ICD-10-CM | POA: Diagnosis not present

## 2018-11-25 DIAGNOSIS — L810 Postinflammatory hyperpigmentation: Secondary | ICD-10-CM | POA: Diagnosis not present

## 2018-12-05 ENCOUNTER — Other Ambulatory Visit: Payer: Self-pay

## 2018-12-05 ENCOUNTER — Emergency Department (HOSPITAL_BASED_OUTPATIENT_CLINIC_OR_DEPARTMENT_OTHER): Payer: PPO

## 2018-12-05 ENCOUNTER — Emergency Department (HOSPITAL_BASED_OUTPATIENT_CLINIC_OR_DEPARTMENT_OTHER)
Admission: EM | Admit: 2018-12-05 | Discharge: 2018-12-05 | Disposition: A | Discharge status: Home / Self Care | Payer: PPO | Attending: Emergency Medicine | Admitting: Emergency Medicine

## 2018-12-05 ENCOUNTER — Encounter (HOSPITAL_BASED_OUTPATIENT_CLINIC_OR_DEPARTMENT_OTHER): Payer: Self-pay

## 2018-12-05 DIAGNOSIS — Z96641 Presence of right artificial hip joint: Secondary | ICD-10-CM | POA: Insufficient documentation

## 2018-12-05 DIAGNOSIS — N281 Cyst of kidney, acquired: Secondary | ICD-10-CM | POA: Diagnosis not present

## 2018-12-05 DIAGNOSIS — Z8546 Personal history of malignant neoplasm of prostate: Secondary | ICD-10-CM | POA: Diagnosis not present

## 2018-12-05 DIAGNOSIS — I10 Essential (primary) hypertension: Secondary | ICD-10-CM | POA: Diagnosis not present

## 2018-12-05 DIAGNOSIS — I129 Hypertensive chronic kidney disease with stage 1 through stage 4 chronic kidney disease, or unspecified chronic kidney disease: Secondary | ICD-10-CM | POA: Insufficient documentation

## 2018-12-05 DIAGNOSIS — N189 Chronic kidney disease, unspecified: Secondary | ICD-10-CM | POA: Insufficient documentation

## 2018-12-05 DIAGNOSIS — Z79899 Other long term (current) drug therapy: Secondary | ICD-10-CM | POA: Diagnosis not present

## 2018-12-05 DIAGNOSIS — R319 Hematuria, unspecified: Secondary | ICD-10-CM | POA: Diagnosis not present

## 2018-12-05 DIAGNOSIS — Z7901 Long term (current) use of anticoagulants: Secondary | ICD-10-CM | POA: Diagnosis not present

## 2018-12-05 DIAGNOSIS — Z96652 Presence of left artificial knee joint: Secondary | ICD-10-CM | POA: Diagnosis not present

## 2018-12-05 LAB — CBC WITH DIFFERENTIAL/PLATELET
Abs Immature Granulocytes: 0.07 10*3/uL (ref 0.00–0.07)
Basophils Absolute: 0.1 10*3/uL (ref 0.0–0.1)
Basophils Relative: 1 %
Eosinophils Absolute: 0.3 10*3/uL (ref 0.0–0.5)
Eosinophils Relative: 3 %
HCT: 48.9 % (ref 39.0–52.0)
Hemoglobin: 14.5 g/dL (ref 13.0–17.0)
Immature Granulocytes: 1 %
Lymphocytes Relative: 22 %
Lymphs Abs: 1.8 10*3/uL (ref 0.7–4.0)
MCH: 30 pg (ref 26.0–34.0)
MCHC: 29.7 g/dL — ABNORMAL LOW (ref 30.0–36.0)
MCV: 101 fL — ABNORMAL HIGH (ref 80.0–100.0)
Monocytes Absolute: 0.8 10*3/uL (ref 0.1–1.0)
Monocytes Relative: 10 %
Neutro Abs: 5.2 10*3/uL (ref 1.7–7.7)
Neutrophils Relative %: 63 %
Platelets: 155 10*3/uL (ref 150–400)
RBC: 4.84 MIL/uL (ref 4.22–5.81)
RDW: 13.6 % (ref 11.5–15.5)
WBC: 8.2 10*3/uL (ref 4.0–10.5)
nRBC: 0 % (ref 0.0–0.2)

## 2018-12-05 LAB — BASIC METABOLIC PANEL
Anion gap: 10 (ref 5–15)
BUN: 25 mg/dL — ABNORMAL HIGH (ref 8–23)
CO2: 21 mmol/L — ABNORMAL LOW (ref 22–32)
Calcium: 10 mg/dL (ref 8.9–10.3)
Chloride: 108 mmol/L (ref 98–111)
Creatinine, Ser: 1.55 mg/dL — ABNORMAL HIGH (ref 0.61–1.24)
GFR calc Af Amer: 51 mL/min — ABNORMAL LOW (ref >60)
GFR calc non Af Amer: 44 mL/min — ABNORMAL LOW (ref >60)
Glucose, Bld: 98 mg/dL (ref 70–99)
Potassium: 5.5 mmol/L — ABNORMAL HIGH (ref 3.5–5.1)
Sodium: 139 mmol/L (ref 135–145)

## 2018-12-05 LAB — URINALYSIS, ROUTINE W REFLEX MICROSCOPIC
Bilirubin Urine: NEGATIVE
Glucose, UA: NEGATIVE mg/dL
Ketones, ur: NEGATIVE mg/dL
Leukocytes,Ua: NEGATIVE
Nitrite: NEGATIVE
Protein, ur: 30 mg/dL — AB
Specific Gravity, Urine: 1.025 (ref 1.005–1.030)
pH: 6 (ref 5.0–8.0)

## 2018-12-05 LAB — URINALYSIS, MICROSCOPIC (REFLEX): RBC / HPF: >50 RBC/hpf (ref 0–5)

## 2018-12-05 MED ORDER — IOHEXOL 300 MG/ML  SOLN
100.0000 mL | Freq: Once | INTRAMUSCULAR | Status: AC | PRN
  Administered 2018-12-05: 18:00:00 100 mL via INTRAVENOUS

## 2018-12-05 NOTE — ED Notes (Signed)
Pt. Urine is clear at this time

## 2018-12-05 NOTE — ED Provider Notes (Signed)
[facility] EMERGENCY DEPARTMENT Provider Note   CSN: [id number] Arrival date & time: 12/05/18  1302    History   Chief Complaint Chief Complaint  Patient presents with   Hematuria    HPI Douglas Hoffman is a 73 y.o. male.     HPI   Pt is a 73 y/o male with a h/o prostate CA s/p prostatectomy, VTE (post surgery, not anticoagulated), HTN, nephrolithiasis, who presents to the ED today for eval of hematuria that started today. Reports intially this AM he had gross blood. It has cleared up throughout the day. Denies dysuria, frequency, or urgency. Denies fevers, abd pain, NVD, bloody stools, flank pain. He is not on blood thinners. He has never had similar sxs. Denies penile discharge or concern for STD.  Past Medical History:  Diagnosis Date   Acute venous embolism and thrombosis of unspecified deep vessels of lower extremity    Cancer ([place])    Glaucoma    left eye since age 15   Gout    Hip pain    History of radiation therapy    Prostate   Hypertension    Prostate cancer [facility])    Renal disorder    reports prior to joint replacement surgery , was taking 5 to 6 aleve daily for pain releif, report shas stopped this and kidney function has omporved, kidney fx followed by his PCP       Patient Active Problem List   Diagnosis Date Noted   Failed total knee, left ([place]) 07/07/2018   Failed total knee, left, initial encounter ([place]) 07/07/2018   Hypertrophy of prostate with urinary obstruction and other lower urinary tract symptoms (LUTS) 03/23/2012   Chronic Cystitis 03/23/2012   Decreased libido 03/23/2012   Impotence of organic origin 03/23/2012   Malignant neoplasm of prostate ([place]) 03/23/2012   Elevated prostate specific antigen (PSA) 03/23/2012   Gout attack 10/30/2011   CKD (chronic kidney disease) 10/30/2011   HTN (hypertension) 10/30/2011   DVT (deep venous thrombosis) ([place]) 10/30/2011    Past Surgical History:  Procedure Laterality  Date   JOINT REPLACEMENT     RIGHT Hip Replacement   JOINT REPLACEMENT     Knee Replacement   SUPRAPUBIC PROSTATECTOMY     TOTAL KNEE REVISION Left 07/07/2018   Procedure: TOTAL KNEE REVISION LEFT KNEE ARTHROPLASTY WITH FEMORAL AND TIBIAL COMPONENTS;  Surgeon: Rod Can, MD;  Location: [facility] ORS;  Service: Orthopedics;  Laterality: Left;        Home Medications    Prior to Admission medications   Medication Sig Start Date End Date Taking? Authorizing Provider  allopurinol (ZYLOPRIM) 100 MG tablet Take 100 mg by mouth daily.    [provider]  amLODipine (NORVASC) 5 MG tablet Take 5 mg by mouth daily.    [provider]  apixaban (ELIQUIS) 2.5 MG TABS tablet Take 1 tablet (2.5 mg total) by mouth 2 (two) times daily. 07/08/18   Swinteck, Aaron Edelman, MD  cholecalciferol (VITAMIN D) 1000 UNITS tablet Take 1,000 Units by mouth daily.    [provider]  docusate sodium (COLACE) 100 MG capsule Take 1 capsule (100 mg total) by mouth 2 (two) times daily. 07/08/18   Swinteck, Aaron Edelman, MD  HYDROcodone-acetaminophen (NORCO/VICODIN) 5-325 MG tablet Take 1 tablet by mouth every 4 (four) hours as needed for moderate pain (pain score 4-6). 07/08/18   Swinteck, Aaron Edelman, MD  lisinopril (PRINIVIL,ZESTRIL) 20 MG tablet Take 20 mg by mouth daily.    [provider]  ondansetron (ZOFRAN) 4 MG tablet Take 1 tablet (4 mg total) by mouth every 6 (six) hours as needed for nausea. 07/08/18   Swinteck, Aaron Edelman, MD  senna (SENOKOT) 8.6 MG TABS tablet Take 2 tablets (17.2 mg total) by mouth at bedtime. 07/08/18   Rod Can, MD    Family History No family history on file.  Social History Social History   Tobacco Use   Smoking status: Never Smoker   Smokeless tobacco: Never Used  Substance Use Topics   Alcohol use: No   Drug use: No     Allergies   Patient has no known allergies.   Review of Systems Review of Systems  Constitutional: Negative for fever.  HENT:  Negative for congestion.   Eyes: Negative for visual disturbance.  Respiratory: Negative for shortness of breath.   Cardiovascular: Negative for chest pain.  Gastrointestinal: Negative for abdominal pain, constipation, diarrhea, nausea and vomiting.  Genitourinary: Positive for hematuria. Negative for dysuria, flank pain, frequency and urgency.  Musculoskeletal: Negative for back pain.  Skin: Negative for color change.  Neurological: Negative for headaches.     Physical Exam Updated Vital Signs BP (!) 145/96    Pulse (!) 56    Temp 98.3 F (36.8 C) (Oral)    Resp 16    Ht 6' (1.829 m)    Wt 93.9 kg    SpO2 100%    BMI 28.07 kg/m   Physical Exam Vitals signs and nursing note reviewed.  Constitutional:      Appearance: He is well-developed.  HENT:     Head: Normocephalic and atraumatic.  Eyes:     Conjunctiva/sclera: Conjunctivae normal.  Neck:     Musculoskeletal: Neck supple.  Cardiovascular:     Rate and Rhythm: Normal rate and regular rhythm.     Heart sounds: Normal heart sounds. No murmur.  Pulmonary:     Effort: Pulmonary effort is normal. No respiratory distress.     Breath sounds: Normal breath sounds. No wheezing, rhonchi or rales.  Abdominal:     General: Bowel sounds are normal.     Palpations: Abdomen is soft.     Tenderness: There is no abdominal tenderness. There is no right CVA tenderness, left CVA tenderness, guarding or rebound.  Skin:    General: Skin is warm and dry.  Neurological:     Mental Status: He is alert.     ED Treatments / Results  Labs (all labs ordered are listed, but only abnormal results are displayed) Labs Reviewed  URINALYSIS, ROUTINE W REFLEX MICROSCOPIC - Abnormal; Notable for the following components:      Result Value   Color, Urine BROWN (*)    APPearance TURBID (*)    Hgb urine dipstick LARGE (*)    Protein, ur 30 (*)    All other components within normal limits  URINALYSIS, MICROSCOPIC (REFLEX) - Abnormal; Notable for  the following components:   Bacteria, UA RARE (*)    All other components within normal limits  CBC WITH DIFFERENTIAL/PLATELET - Abnormal; Notable for the following components:   MCV 101.0 (*)    MCHC 29.7 (*)    All other components within normal limits  BASIC METABOLIC PANEL - Abnormal; Notable for the following components:   Potassium 5.5 (*)    CO2 21 (*)    BUN 25 (*)    Creatinine, Ser 1.55 (*)    GFR calc non Af Amer 44 (*)    GFR calc Af Amer 51 (*)  All other components within normal limits  URINE CULTURE    EKG EKG Interpretation  Date/Time:  Monday December 05 2018 18:53:59 EDT Ventricular Rate:  54 PR Interval:    QRS Duration: 100 QT Interval:  416 QTC Calculation: 395 R Axis:   61 Text Interpretation:  Sinus rhythm Confirmed by Quintella Reichert [phone]) on 12/05/2018 7:20:10 PM   Radiology Ct Abdomen Pelvis W Contrast  Result Date: 12/05/2018 CLINICAL DATA:  Blood in the urine today. EXAM: CT ABDOMEN AND PELVIS WITH CONTRAST TECHNIQUE: Multidetector CT imaging of the abdomen and pelvis was performed using the standard protocol following bolus administration of intravenous contrast. CONTRAST:  145mL OMNIPAQUE IOHEXOL 300 MG/ML  SOLN COMPARISON:  August 11, 2017 FINDINGS: Lower chest: Mild linear atelectasis is identified in the left lung base. The heart size is normal. Hepatobiliary: No focal liver abnormality is seen. No gallstones, gallbladder wall thickening, or biliary dilatation. Pancreas: Unremarkable. No pancreatic ductal dilatation or surrounding inflammatory changes. Spleen: Normal in size without focal abnormality. Adrenals/Urinary Tract: The bilateral adrenal glands are normal. Bilateral mild chronic perinephric stranding are identified unchanged. There is no hydronephrosis bilaterally. A small cyst is identified in the upper pole of the left kidney. The bladder is normal. Stomach/Bowel: Stomach is within normal limits. Appendix appears normal. No evidence of bowel  wall thickening, distention, or inflammatory changes. Vascular/Lymphatic: Aortic atherosclerosis. No enlarged abdominal or pelvic lymph nodes. Reproductive: Status post prostatectomy with surgical clips in the pelvis. Other: None. Musculoskeletal: Degenerative joint changes of the spine are noted. IMPRESSION: No hydronephrosis is identified bilaterally. Small cyst is identified in the upper pole left kidney. Bilateral mild chronic perinephric stranding unchanged compared prior exam. No acute abnormality identified in the abdomen and pelvis. Electronically Signed   By: Abelardo Diesel M.D.   On: 12/05/2018 18:01    Procedures Procedures (including critical care time)  Medications Ordered in ED Medications  iohexol (OMNIPAQUE) 300 MG/ML solution 100 mL (100 mLs Intravenous Contrast Given 12/05/18 1731)     Initial Impression / Assessment and Plan / ED Course  I have reviewed the triage vital signs and the nursing notes.  Pertinent labs & imaging results that were available during my care of the patient were reviewed by me and considered in my medical decision making (see chart for details).     Final Clinical Impressions(s) / ED Diagnoses   Final diagnoses:  Hematuria, unspecified type   73 y/o male presenting for painless hematuria that started this AM.  Denies urinary symptoms.  No flank pain or abdominal pain.  No fevers.  Exam is benign.  Vital signs are reassuring.  Will obtain labs and CT imaging.  CBC is quite leukocytosis.  No anemia present. BMP with elevated BUN/creatinine at 25 and 1.55.  This does appear to be near patient's baseline and actually improved from prior study 5 months ago.  His potassium is elevated at 5.5 however there is hemolysis with the lab therefore this is unlikely to truly be elevated.  UA with hematuria and proteinuria.  No leukocytes or nitrites.  Greater than 50 RBCs, 0-5 WBCs and rare bacteria noted.  Urine culture was sent.  EKG was obtained and there  is no evidence of peaked T waves making hyperkalemia less likely.  CT abdomen/pelvis shows no hydro-nephrosis noted.  Small cyst noted in the upper pole of the left kidney.  Mild bilateral chronic perinephric stranding which is unknown change from prior exam.  No acute abnormality noted.  8:06 PM CONSULT  with Dr. Lovena Neighbours who states abc are not indiacated give pt asx and has no urinary sxs. States pt should f/u with Dr. Roni Bread as outpt.   Discussed findings with the patient and his wife at bedside.  Discussed that he will need to have his labs repeated to ensure he is not hyperkalemic.  He will also need to call urology to make an appointment for follow-up.  Have advised him to return to the ER for any new or worsening symptoms in the meantime.  They voiced understanding of the plan and reasons to return.  All questions answered.  Patient stable for discharge.  ED Discharge Orders    None       Bishop Dublin 12/05/18 2238    Quintella Reichert, MD 12/06/18 1228

## 2018-12-05 NOTE — Discharge Instructions (Addendum)
A culture was sent of your urine today to determine if there is any bacterial growth. If the results of the culture are positive and you require an antibiotic or a change of your prescribed antibiotic you will be contacted by the hospital. If the results are negative you will not be contacted.  You will need to call the urology office to schedule an appointment for follow-up.  Please call the office tomorrow to schedule an appointment to be seen within the next 1 to 2 weeks.  Please return to the emergency department for any new or worsening symptoms including fevers, pain with urination, continued bleeding when you urinate, abdominal pain.

## 2018-12-05 NOTE — ED Notes (Signed)
Attempt to insert PIV unsuccessful x2

## 2018-12-05 NOTE — ED Triage Notes (Addendum)
Pt c/o blood in urine x today-NAD-steady gait with own cane

## 2018-12-05 NOTE — ED Notes (Signed)
Pt. Reports he does not feel like he has any urine left in his bladder after going to the restroom after coming to room 5.

## 2018-12-05 NOTE — ED Notes (Signed)
ED Provider at bedside. 

## 2018-12-06 LAB — URINE CULTURE: Culture: NO GROWTH

## 2018-12-14 DIAGNOSIS — R972 Elevated prostate specific antigen [PSA]: Secondary | ICD-10-CM | POA: Diagnosis not present

## 2018-12-14 DIAGNOSIS — N3041 Irradiation cystitis with hematuria: Secondary | ICD-10-CM | POA: Diagnosis not present

## 2018-12-14 DIAGNOSIS — Z8546 Personal history of malignant neoplasm of prostate: Secondary | ICD-10-CM | POA: Diagnosis not present

## 2018-12-14 DIAGNOSIS — R310 Gross hematuria: Secondary | ICD-10-CM | POA: Diagnosis not present

## 2019-01-10 DIAGNOSIS — Z96652 Presence of left artificial knee joint: Secondary | ICD-10-CM | POA: Diagnosis not present

## 2019-01-10 DIAGNOSIS — Z471 Aftercare following joint replacement surgery: Secondary | ICD-10-CM | POA: Diagnosis not present

## 2019-01-13 DIAGNOSIS — Z23 Encounter for immunization: Secondary | ICD-10-CM | POA: Diagnosis not present

## 2019-01-26 DIAGNOSIS — R972 Elevated prostate specific antigen [PSA]: Secondary | ICD-10-CM | POA: Diagnosis not present

## 2019-02-01 DIAGNOSIS — N3041 Irradiation cystitis with hematuria: Secondary | ICD-10-CM | POA: Diagnosis not present

## 2019-02-01 DIAGNOSIS — Z8546 Personal history of malignant neoplasm of prostate: Secondary | ICD-10-CM | POA: Diagnosis not present

## 2019-02-01 DIAGNOSIS — R310 Gross hematuria: Secondary | ICD-10-CM | POA: Diagnosis not present

## 2019-03-08 DIAGNOSIS — L309 Dermatitis, unspecified: Secondary | ICD-10-CM | POA: Diagnosis not present

## 2019-03-08 DIAGNOSIS — N1831 Chronic kidney disease, stage 3a: Secondary | ICD-10-CM | POA: Diagnosis not present

## 2019-03-08 DIAGNOSIS — I129 Hypertensive chronic kidney disease with stage 1 through stage 4 chronic kidney disease, or unspecified chronic kidney disease: Secondary | ICD-10-CM | POA: Diagnosis not present

## 2019-03-17 DIAGNOSIS — L309 Dermatitis, unspecified: Secondary | ICD-10-CM | POA: Diagnosis not present

## 2019-03-21 DIAGNOSIS — Z79899 Other long term (current) drug therapy: Secondary | ICD-10-CM | POA: Diagnosis not present

## 2019-03-21 DIAGNOSIS — Z5181 Encounter for therapeutic drug level monitoring: Secondary | ICD-10-CM | POA: Diagnosis not present

## 2019-05-10 ENCOUNTER — Encounter: Payer: Self-pay | Admitting: Internal Medicine

## 2019-05-12 ENCOUNTER — Telehealth: Payer: Self-pay | Admitting: *Deleted

## 2019-05-12 NOTE — Telephone Encounter (Signed)
This is close enough, since he was referred.  Go ahead and proceed with screening colonoscopy as desired by his PCP.  Thanks for checking

## 2019-05-12 NOTE — Telephone Encounter (Signed)
Dr Henrene Pastor,  This gentleman is scheduled for a direct colon with you on 06-06-2019. It states in his epic notes under appointments last colon 2008. I did, however, find a colonoscopy 08-14-2010 at [facility] - he had F/V, excellent prep and the colonoscopy was normal, no polyps, no lesions  He has no GI hx with you, no previous OV- he has not had a colo guard that I can find - referral placed 05-09-2019 by Dr Ardeth Perfect for a screening colon   Should he have a recall for 2022?  Please advise and thanks for your time, Thanks, Lelan Pons

## 2019-05-12 NOTE — Telephone Encounter (Signed)
Will proceed as scheduled  

## 2019-05-23 ENCOUNTER — Other Ambulatory Visit: Payer: Self-pay

## 2019-05-23 ENCOUNTER — Ambulatory Visit (AMBULATORY_SURGERY_CENTER): Payer: Self-pay | Admitting: *Deleted

## 2019-05-23 VITALS — Temp 97.3°F | Ht 72.0 in | Wt 204.0 lb

## 2019-05-23 DIAGNOSIS — Z1211 Encounter for screening for malignant neoplasm of colon: Secondary | ICD-10-CM

## 2019-05-23 DIAGNOSIS — Z01818 Encounter for other preprocedural examination: Secondary | ICD-10-CM

## 2019-05-23 MED ORDER — PLENVU 140 G PO SOLR: 1.0000 | ORAL | 0 refills | Status: DC

## 2019-05-23 NOTE — Progress Notes (Signed)

## 2019-05-25 ENCOUNTER — Encounter: Payer: Self-pay | Admitting: Internal Medicine

## 2019-05-30 ENCOUNTER — Telehealth: Payer: Self-pay | Admitting: *Deleted

## 2019-05-30 NOTE — Telephone Encounter (Signed)
Assisted pt with placement on Covid vaccine wait list

## 2019-06-01 ENCOUNTER — Other Ambulatory Visit: Payer: Self-pay | Admitting: Internal Medicine

## 2019-06-01 ENCOUNTER — Ambulatory Visit (INDEPENDENT_AMBULATORY_CARE_PROVIDER_SITE_OTHER): Payer: Self-pay

## 2019-06-01 DIAGNOSIS — Z1159 Encounter for screening for other viral diseases: Secondary | ICD-10-CM | POA: Diagnosis not present

## 2019-06-01 LAB — SARS CORONAVIRUS 2 (TAT 6-24 HRS): SARS Coronavirus 2: NEGATIVE

## 2019-06-06 ENCOUNTER — Ambulatory Visit (AMBULATORY_SURGERY_CENTER): Payer: PPO | Admitting: Internal Medicine

## 2019-06-06 ENCOUNTER — Other Ambulatory Visit: Payer: Self-pay

## 2019-06-06 ENCOUNTER — Encounter: Payer: Self-pay | Admitting: Internal Medicine

## 2019-06-06 VITALS — BP 117/82 | HR 74 | Temp 95.0°F | Resp 17 | Ht 72.0 in | Wt 204.0 lb

## 2019-06-06 DIAGNOSIS — Z1211 Encounter for screening for malignant neoplasm of colon: Secondary | ICD-10-CM

## 2019-06-06 MED ORDER — SODIUM CHLORIDE 0.9 % IV SOLN: 500.0000 mL | Freq: Once | INTRAVENOUS | Status: DC

## 2019-06-06 NOTE — Progress Notes (Signed)
Pt. Took for knee surgery last March, not taking now

## 2019-06-06 NOTE — Patient Instructions (Signed)
Handout on hemorrhoids given to you today   YOU HAD AN ENDOSCOPIC PROCEDURE TODAY AT [facility]:   Refer to the procedure report that was given to you for any specific questions about what was found during the examination.  If the procedure report does not answer your questions, please call your gastroenterologist to clarify.  If you requested that your care partner not be given the details of your procedure findings, then the procedure report has been included in a sealed envelope for you to review at your convenience later.  YOU SHOULD EXPECT: Some feelings of bloating in the abdomen. Passage of more gas than usual.  Walking can help get rid of the air that was put into your GI tract during the procedure and reduce the bloating. If you had a lower endoscopy (such as a colonoscopy or flexible sigmoidoscopy) you may notice spotting of blood in your stool or on the toilet paper. If you underwent a bowel prep for your procedure, you may not have a normal bowel movement for a few days.  Please Note:  You might notice some irritation and congestion in your nose or some drainage.  This is from the oxygen used during your procedure.  There is no need for concern and it should clear up in a day or so.  SYMPTOMS TO REPORT IMMEDIATELY:   Following lower endoscopy (colonoscopy or flexible sigmoidoscopy):  Excessive amounts of blood in the stool  Significant tenderness or worsening of abdominal pains  Swelling of the abdomen that is new, acute  Fever of 100F or higher  For urgent or emergent issues, a gastroenterologist can be reached at any hour by calling [phone].   DIET:  We do recommend a small meal at first, but then you may proceed to your regular diet.  Drink plenty of fluids but you should avoid alcoholic beverages for 24 hours.  ACTIVITY:  You should plan to take it easy for the rest of today and you should NOT DRIVE or use heavy machinery until tomorrow (because of the  sedation medicines used during the test).    FOLLOW UP: Our staff will call the number listed on your records 48-72 hours following your procedure to check on you and address any questions or concerns that you may have regarding the information given to you following your procedure. If we do not reach you, we will leave a message.  We will attempt to reach you two times.  During this call, we will ask if you have developed any symptoms of COVID 19. If you develop any symptoms (ie: fever, flu-like symptoms, shortness of breath, cough etc.) before then, please call [phone].  If you test positive for Covid 19 in the 2 weeks post procedure, please call and report this information to [place].    If any biopsies were taken you will be contacted by phone or by letter within the next 1-3 weeks.  Please call us at [phone] if you have not heard about the biopsies in 3 weeks.    SIGNATURES/CONFIDENTIALITY: You and/or your care partner have signed paperwork which will be entered into your electronic medical record.  These signatures attest to the fact that that the information above on your After Visit Summary has been reviewed and is understood.  Full responsibility of the confidentiality of this discharge information lies with you and/or your care-partner.

## 2019-06-06 NOTE — Progress Notes (Signed)
Pt tolerated well. VSS. Awake and to recovery. 

## 2019-06-06 NOTE — Progress Notes (Signed)
Pt's states no medical or surgical changes since previsit or office visit. 

## 2019-06-06 NOTE — Progress Notes (Signed)
Temperature taken by J.B., VS taken N.C.

## 2019-06-06 NOTE — Op Note (Signed)
[facility] Patient Name: Douglas Hoffman Procedure Date: 06/06/2019 8:00 AM MRN: [id number] Endoscopist: Docia Chuck. Henrene Pastor , MD Age: 74 Referring MD:  Date of Birth: [birth year] Gender: Male Account #: [id number] Procedure:                Colonoscopy Indications:              Screening for colorectal malignant neoplasm. Prior                            exam elsewhere 2012 Medicines:                Monitored Anesthesia Care Procedure:                Pre-Anesthesia Assessment:                           - Prior to the procedure, a History and Physical                            was performed, and patient medications and                            allergies were reviewed. The patient's tolerance of                            previous anesthesia was also reviewed. The risks                            and benefits of the procedure and the sedation                            options and risks were discussed with the patient.                            All questions were answered, and informed consent                            was obtained. Prior Anticoagulants: The patient has                            taken no previous anticoagulant or antiplatelet                            agents. ASA Grade Assessment: II - A patient with                            mild systemic disease. After reviewing the risks                            and benefits, the patient was deemed in                            satisfactory condition to undergo the procedure.  After obtaining informed consent, the colonoscope                            was passed under direct vision. Throughout the                            procedure, the patient's blood pressure, pulse, and                            oxygen saturations were monitored continuously. The                            Colonoscope was introduced through the anus and                            advanced to the the cecum, identified by                         appendiceal orifice and ileocecal valve. The                            ileocecal valve, appendiceal orifice, and rectum                            were photographed. The quality of the bowel                            preparation was excellent. The colonoscopy was                            performed without difficulty. The patient tolerated                            the procedure well. The bowel preparation used was                            SUPREP via split dose instruction. Scope In: 8:32:58 AM Scope Out: 8:43:56 AM Scope Withdrawal Time: 0 hours 8 minutes 13 seconds  Total Procedure Duration: 0 hours 10 minutes 58 seconds  Findings:                 Internal hemorrhoids were found during                            retroflexion. The hemorrhoids were small.                           The entire examined colon appeared normal on direct                            and retroflexion views. Complications:            No immediate complications. Estimated blood loss:                            None. Estimated Blood Loss:  Estimated blood loss: none. Impression:               - Internal hemorrhoids.                           - The entire examined colon is normal on direct and                            retroflexion views.                           - No specimens collected. Recommendation:           - Repeat colonoscopy is not recommended due to                            current age (11 years or older) for screening                            purposes.                           - Patient has a contact number available for                            emergencies. The signs and symptoms of potential                            delayed complications were discussed with the                            patient. Return to normal activities tomorrow.                            Written discharge instructions were provided to the                            patient.                            - Resume previous diet.                           - Continue present medications. Docia Chuck. Henrene Pastor, MD 06/06/2019 8:57:51 AM This report has been signed electronically.

## 2019-06-08 ENCOUNTER — Telehealth: Payer: Self-pay

## 2019-06-08 NOTE — Telephone Encounter (Signed)
  Follow up Call-  Call back number 06/06/2019  Post procedure Call Back phone  # [phone]  Permission to leave phone message Yes  Some recent data might be hidden     Patient questions:  Do you have a fever, pain , or abdominal swelling? No. Pain Score  0 *  Have you tolerated food without any problems? Yes.    Have you been able to return to your normal activities? Yes.    Do you have any questions about your discharge instructions: Diet   No. Medications  No. Follow up visit  No.  Do you have questions or concerns about your Care? No.  Actions: * If pain score is 4 or above: 1. No action needed, pain <4.Have you developed a fever since your procedure? no  2.   Have you had an respiratory symptoms (SOB or cough) since your procedure? no  3.   Have you tested positive for COVID 19 since your procedure no  4.   Have you had any family members/close contacts diagnosed with the COVID 19 since your procedure?  no   If yes to any of these questions please route to Joylene John, RN and Alphonsa Gin, Therapist, sports.

## 2019-09-06 DIAGNOSIS — E291 Testicular hypofunction: Secondary | ICD-10-CM | POA: Diagnosis not present

## 2019-09-06 DIAGNOSIS — E781 Pure hyperglyceridemia: Secondary | ICD-10-CM | POA: Diagnosis not present

## 2019-09-06 DIAGNOSIS — Z125 Encounter for screening for malignant neoplasm of prostate: Secondary | ICD-10-CM | POA: Diagnosis not present

## 2019-09-11 DIAGNOSIS — Z471 Aftercare following joint replacement surgery: Secondary | ICD-10-CM | POA: Diagnosis not present

## 2019-09-11 DIAGNOSIS — Z96652 Presence of left artificial knee joint: Secondary | ICD-10-CM | POA: Diagnosis not present

## 2019-09-14 DIAGNOSIS — N1831 Chronic kidney disease, stage 3a: Secondary | ICD-10-CM | POA: Diagnosis not present

## 2019-09-14 DIAGNOSIS — Z8546 Personal history of malignant neoplasm of prostate: Secondary | ICD-10-CM | POA: Diagnosis not present

## 2019-09-14 DIAGNOSIS — R82998 Other abnormal findings in urine: Secondary | ICD-10-CM | POA: Diagnosis not present

## 2019-09-14 DIAGNOSIS — H5442A5 Blindness left eye category 5, normal vision right eye: Secondary | ICD-10-CM | POA: Diagnosis not present

## 2019-09-14 DIAGNOSIS — Z1331 Encounter for screening for depression: Secondary | ICD-10-CM | POA: Diagnosis not present

## 2019-09-14 DIAGNOSIS — Z1212 Encounter for screening for malignant neoplasm of rectum: Secondary | ICD-10-CM | POA: Diagnosis not present

## 2019-09-14 DIAGNOSIS — I129 Hypertensive chronic kidney disease with stage 1 through stage 4 chronic kidney disease, or unspecified chronic kidney disease: Secondary | ICD-10-CM | POA: Diagnosis not present

## 2019-09-14 DIAGNOSIS — E291 Testicular hypofunction: Secondary | ICD-10-CM | POA: Diagnosis not present

## 2019-09-14 DIAGNOSIS — Z0000 Encounter for general adult medical examination without abnormal findings: Secondary | ICD-10-CM | POA: Diagnosis not present

## 2019-10-04 DIAGNOSIS — N3020 Other chronic cystitis without hematuria: Secondary | ICD-10-CM | POA: Diagnosis not present

## 2019-10-04 DIAGNOSIS — R310 Gross hematuria: Secondary | ICD-10-CM | POA: Diagnosis not present

## 2019-10-04 DIAGNOSIS — N5201 Erectile dysfunction due to arterial insufficiency: Secondary | ICD-10-CM | POA: Diagnosis not present

## 2019-10-04 DIAGNOSIS — R351 Nocturia: Secondary | ICD-10-CM | POA: Diagnosis not present

## 2019-10-04 DIAGNOSIS — Z8546 Personal history of malignant neoplasm of prostate: Secondary | ICD-10-CM | POA: Diagnosis not present

## 2020-01-13 DIAGNOSIS — Z20822 Contact with and (suspected) exposure to covid-19: Secondary | ICD-10-CM | POA: Diagnosis not present

## 2020-02-02 DIAGNOSIS — L309 Dermatitis, unspecified: Secondary | ICD-10-CM | POA: Diagnosis not present

## 2020-02-02 DIAGNOSIS — Z7189 Other specified counseling: Secondary | ICD-10-CM | POA: Diagnosis not present

## 2020-02-07 ENCOUNTER — Other Ambulatory Visit: Payer: Self-pay | Admitting: Internal Medicine

## 2020-02-07 DIAGNOSIS — N184 Chronic kidney disease, stage 4 (severe): Secondary | ICD-10-CM

## 2020-02-08 DIAGNOSIS — N182 Chronic kidney disease, stage 2 (mild): Secondary | ICD-10-CM | POA: Diagnosis not present

## 2020-02-13 DIAGNOSIS — L308 Other specified dermatitis: Secondary | ICD-10-CM | POA: Diagnosis not present

## 2020-02-13 DIAGNOSIS — I872 Venous insufficiency (chronic) (peripheral): Secondary | ICD-10-CM | POA: Diagnosis not present

## 2020-02-14 ENCOUNTER — Ambulatory Visit
Admission: RE | Admit: 2020-02-14 | Discharge: 2020-02-14 | Disposition: A | Discharge status: Home / Self Care | Payer: PPO | Source: Ambulatory Visit | Attending: Internal Medicine | Admitting: Internal Medicine

## 2020-02-14 DIAGNOSIS — N184 Chronic kidney disease, stage 4 (severe): Secondary | ICD-10-CM

## 2020-02-14 DIAGNOSIS — N189 Chronic kidney disease, unspecified: Secondary | ICD-10-CM | POA: Diagnosis not present

## 2020-02-17 DIAGNOSIS — Z23 Encounter for immunization: Secondary | ICD-10-CM | POA: Diagnosis not present

## 2020-04-03 ENCOUNTER — Ambulatory Visit: Payer: PPO | Admitting: Dermatology

## 2020-05-15 DIAGNOSIS — Z20828 Contact with and (suspected) exposure to other viral communicable diseases: Secondary | ICD-10-CM | POA: Diagnosis not present

## 2020-05-27 ENCOUNTER — Other Ambulatory Visit: Payer: PPO

## 2020-05-27 ENCOUNTER — Other Ambulatory Visit: Payer: Self-pay

## 2020-05-27 DIAGNOSIS — Z20822 Contact with and (suspected) exposure to covid-19: Secondary | ICD-10-CM | POA: Diagnosis not present

## 2020-05-29 LAB — NOVEL CORONAVIRUS, NAA: SARS-CoV-2, NAA: NOT DETECTED

## 2020-05-29 LAB — SARS-COV-2, NAA 2 DAY TAT

## 2020-09-18 DIAGNOSIS — E291 Testicular hypofunction: Secondary | ICD-10-CM | POA: Diagnosis not present

## 2020-09-18 DIAGNOSIS — M109 Gout, unspecified: Secondary | ICD-10-CM | POA: Diagnosis not present

## 2020-09-18 DIAGNOSIS — I10 Essential (primary) hypertension: Secondary | ICD-10-CM | POA: Diagnosis not present

## 2020-09-18 DIAGNOSIS — Z125 Encounter for screening for malignant neoplasm of prostate: Secondary | ICD-10-CM | POA: Diagnosis not present

## 2020-09-25 DIAGNOSIS — M109 Gout, unspecified: Secondary | ICD-10-CM | POA: Diagnosis not present

## 2020-09-25 DIAGNOSIS — Z1331 Encounter for screening for depression: Secondary | ICD-10-CM | POA: Diagnosis not present

## 2020-09-25 DIAGNOSIS — Z1389 Encounter for screening for other disorder: Secondary | ICD-10-CM | POA: Diagnosis not present

## 2020-09-25 DIAGNOSIS — E291 Testicular hypofunction: Secondary | ICD-10-CM | POA: Diagnosis not present

## 2020-09-25 DIAGNOSIS — Z0000 Encounter for general adult medical examination without abnormal findings: Secondary | ICD-10-CM | POA: Diagnosis not present

## 2020-09-25 DIAGNOSIS — I10 Essential (primary) hypertension: Secondary | ICD-10-CM | POA: Diagnosis not present

## 2020-09-25 DIAGNOSIS — N1831 Chronic kidney disease, stage 3a: Secondary | ICD-10-CM | POA: Diagnosis not present

## 2020-09-25 DIAGNOSIS — R82998 Other abnormal findings in urine: Secondary | ICD-10-CM | POA: Diagnosis not present

## 2020-11-21 DIAGNOSIS — M1009 Idiopathic gout, multiple sites: Secondary | ICD-10-CM | POA: Diagnosis not present

## 2020-11-21 DIAGNOSIS — M25571 Pain in right ankle and joints of right foot: Secondary | ICD-10-CM | POA: Diagnosis not present

## 2021-07-03 DIAGNOSIS — L089 Local infection of the skin and subcutaneous tissue, unspecified: Secondary | ICD-10-CM | POA: Diagnosis not present

## 2021-07-03 DIAGNOSIS — L309 Dermatitis, unspecified: Secondary | ICD-10-CM | POA: Diagnosis not present

## 2021-07-18 DIAGNOSIS — L308 Other specified dermatitis: Secondary | ICD-10-CM | POA: Diagnosis not present

## 2021-08-21 DIAGNOSIS — L308 Other specified dermatitis: Secondary | ICD-10-CM | POA: Diagnosis not present

## 2021-10-01 DIAGNOSIS — M109 Gout, unspecified: Secondary | ICD-10-CM | POA: Diagnosis not present

## 2021-10-01 DIAGNOSIS — E291 Testicular hypofunction: Secondary | ICD-10-CM | POA: Diagnosis not present

## 2021-10-01 DIAGNOSIS — Z125 Encounter for screening for malignant neoplasm of prostate: Secondary | ICD-10-CM | POA: Diagnosis not present

## 2021-10-01 DIAGNOSIS — I10 Essential (primary) hypertension: Secondary | ICD-10-CM | POA: Diagnosis not present

## 2021-10-01 DIAGNOSIS — R7989 Other specified abnormal findings of blood chemistry: Secondary | ICD-10-CM | POA: Diagnosis not present

## 2021-10-05 ENCOUNTER — Inpatient Hospital Stay (HOSPITAL_COMMUNITY)
Admission: EM | Admit: 2021-10-05 | Discharge: 2021-10-11 | DRG: 175 | Disposition: A | Discharge status: Home / Self Care | Payer: PPO | Attending: Family Medicine | Admitting: Family Medicine

## 2021-10-05 ENCOUNTER — Emergency Department (HOSPITAL_COMMUNITY): Payer: PPO

## 2021-10-05 DIAGNOSIS — R571 Hypovolemic shock: Secondary | ICD-10-CM | POA: Diagnosis present

## 2021-10-05 DIAGNOSIS — I214 Non-ST elevation (NSTEMI) myocardial infarction: Secondary | ICD-10-CM | POA: Diagnosis not present

## 2021-10-05 DIAGNOSIS — D689 Coagulation defect, unspecified: Secondary | ICD-10-CM | POA: Diagnosis present

## 2021-10-05 DIAGNOSIS — Z7901 Long term (current) use of anticoagulants: Secondary | ICD-10-CM

## 2021-10-05 DIAGNOSIS — Z86718 Personal history of other venous thrombosis and embolism: Secondary | ICD-10-CM

## 2021-10-05 DIAGNOSIS — S40021A Contusion of right upper arm, initial encounter: Secondary | ICD-10-CM | POA: Diagnosis present

## 2021-10-05 DIAGNOSIS — Z96652 Presence of left artificial knee joint: Secondary | ICD-10-CM | POA: Diagnosis present

## 2021-10-05 DIAGNOSIS — Z79899 Other long term (current) drug therapy: Secondary | ICD-10-CM

## 2021-10-05 DIAGNOSIS — I2609 Other pulmonary embolism with acute cor pulmonale: Secondary | ICD-10-CM | POA: Diagnosis not present

## 2021-10-05 DIAGNOSIS — Z923 Personal history of irradiation: Secondary | ICD-10-CM

## 2021-10-05 DIAGNOSIS — R197 Diarrhea, unspecified: Secondary | ICD-10-CM | POA: Diagnosis not present

## 2021-10-05 DIAGNOSIS — D696 Thrombocytopenia, unspecified: Secondary | ICD-10-CM | POA: Diagnosis present

## 2021-10-05 DIAGNOSIS — N179 Acute kidney failure, unspecified: Secondary | ICD-10-CM | POA: Diagnosis not present

## 2021-10-05 DIAGNOSIS — E875 Hyperkalemia: Secondary | ICD-10-CM | POA: Diagnosis present

## 2021-10-05 DIAGNOSIS — Z96641 Presence of right artificial hip joint: Secondary | ICD-10-CM | POA: Diagnosis present

## 2021-10-05 DIAGNOSIS — E872 Acidosis, unspecified: Secondary | ICD-10-CM | POA: Diagnosis present

## 2021-10-05 DIAGNOSIS — I361 Nonrheumatic tricuspid (valve) insufficiency: Secondary | ICD-10-CM | POA: Diagnosis not present

## 2021-10-05 DIAGNOSIS — E861 Hypovolemia: Secondary | ICD-10-CM | POA: Diagnosis present

## 2021-10-05 DIAGNOSIS — X58XXXA Exposure to other specified factors, initial encounter: Secondary | ICD-10-CM | POA: Diagnosis present

## 2021-10-05 DIAGNOSIS — R079 Chest pain, unspecified: Secondary | ICD-10-CM | POA: Diagnosis not present

## 2021-10-05 DIAGNOSIS — I2510 Atherosclerotic heart disease of native coronary artery without angina pectoris: Secondary | ICD-10-CM | POA: Diagnosis present

## 2021-10-05 DIAGNOSIS — R579 Shock, unspecified: Secondary | ICD-10-CM | POA: Diagnosis not present

## 2021-10-05 DIAGNOSIS — I499 Cardiac arrhythmia, unspecified: Secondary | ICD-10-CM | POA: Diagnosis not present

## 2021-10-05 DIAGNOSIS — N1832 Chronic kidney disease, stage 3b: Secondary | ICD-10-CM | POA: Diagnosis present

## 2021-10-05 DIAGNOSIS — Z7982 Long term (current) use of aspirin: Secondary | ICD-10-CM

## 2021-10-05 DIAGNOSIS — R231 Pallor: Secondary | ICD-10-CM | POA: Diagnosis not present

## 2021-10-05 DIAGNOSIS — E1122 Type 2 diabetes mellitus with diabetic chronic kidney disease: Secondary | ICD-10-CM | POA: Diagnosis present

## 2021-10-05 DIAGNOSIS — I959 Hypotension, unspecified: Secondary | ICD-10-CM | POA: Diagnosis not present

## 2021-10-05 DIAGNOSIS — M109 Gout, unspecified: Secondary | ICD-10-CM | POA: Diagnosis present

## 2021-10-05 DIAGNOSIS — I2721 Secondary pulmonary arterial hypertension: Secondary | ICD-10-CM | POA: Diagnosis present

## 2021-10-05 DIAGNOSIS — Z8546 Personal history of malignant neoplasm of prostate: Secondary | ICD-10-CM | POA: Diagnosis not present

## 2021-10-05 DIAGNOSIS — J9811 Atelectasis: Secondary | ICD-10-CM | POA: Diagnosis not present

## 2021-10-05 DIAGNOSIS — I248 Other forms of acute ischemic heart disease: Secondary | ICD-10-CM | POA: Diagnosis present

## 2021-10-05 DIAGNOSIS — N170 Acute kidney failure with tubular necrosis: Secondary | ICD-10-CM | POA: Diagnosis present

## 2021-10-05 DIAGNOSIS — Y929 Unspecified place or not applicable: Secondary | ICD-10-CM

## 2021-10-05 DIAGNOSIS — I129 Hypertensive chronic kidney disease with stage 1 through stage 4 chronic kidney disease, or unspecified chronic kidney disease: Secondary | ICD-10-CM | POA: Diagnosis present

## 2021-10-05 DIAGNOSIS — E1165 Type 2 diabetes mellitus with hyperglycemia: Secondary | ICD-10-CM | POA: Diagnosis present

## 2021-10-05 DIAGNOSIS — I82409 Acute embolism and thrombosis of unspecified deep veins of unspecified lower extremity: Secondary | ICD-10-CM | POA: Diagnosis present

## 2021-10-05 DIAGNOSIS — R042 Hemoptysis: Secondary | ICD-10-CM | POA: Diagnosis present

## 2021-10-05 DIAGNOSIS — T148XXA Other injury of unspecified body region, initial encounter: Secondary | ICD-10-CM

## 2021-10-05 DIAGNOSIS — S40022A Contusion of left upper arm, initial encounter: Secondary | ICD-10-CM | POA: Diagnosis present

## 2021-10-05 DIAGNOSIS — I2699 Other pulmonary embolism without acute cor pulmonale: Secondary | ICD-10-CM | POA: Diagnosis not present

## 2021-10-05 DIAGNOSIS — R110 Nausea: Secondary | ICD-10-CM | POA: Diagnosis not present

## 2021-10-05 DIAGNOSIS — R42 Dizziness and giddiness: Secondary | ICD-10-CM | POA: Diagnosis not present

## 2021-10-05 LAB — CBC WITH DIFFERENTIAL/PLATELET
Abs Immature Granulocytes: 0.09 10*3/uL — ABNORMAL HIGH (ref 0.00–0.07)
Basophils Absolute: 0.1 10*3/uL (ref 0.0–0.1)
Basophils Relative: 1 %
Eosinophils Absolute: 0.2 10*3/uL (ref 0.0–0.5)
Eosinophils Relative: 1 %
HCT: 40.2 % (ref 39.0–52.0)
Hemoglobin: 13 g/dL (ref 13.0–17.0)
Immature Granulocytes: 1 %
Lymphocytes Relative: 13 %
Lymphs Abs: 1.5 10*3/uL (ref 0.7–4.0)
MCH: 31.5 pg (ref 26.0–34.0)
MCHC: 32.3 g/dL (ref 30.0–36.0)
MCV: 97.3 fL (ref 80.0–100.0)
Monocytes Absolute: 1 10*3/uL (ref 0.1–1.0)
Monocytes Relative: 9 %
Neutro Abs: 8.3 10*3/uL — ABNORMAL HIGH (ref 1.7–7.7)
Neutrophils Relative %: 75 %
Platelets: 113 10*3/uL — ABNORMAL LOW (ref 150–400)
RBC: 4.13 MIL/uL — ABNORMAL LOW (ref 4.22–5.81)
RDW: 13.3 % (ref 11.5–15.5)
WBC: 11 10*3/uL — ABNORMAL HIGH (ref 4.0–10.5)
nRBC: 0 % (ref 0.0–0.2)

## 2021-10-05 LAB — PROTIME-INR
INR: 1.2 (ref 0.8–1.2)
Prothrombin Time: 15.1 seconds (ref 11.4–15.2)

## 2021-10-05 LAB — COMPREHENSIVE METABOLIC PANEL
ALT: 21 U/L (ref 0–44)
AST: 28 U/L (ref 15–41)
Albumin: 3.3 g/dL — ABNORMAL LOW (ref 3.5–5.0)
Alkaline Phosphatase: 72 U/L (ref 38–126)
Anion gap: 9 (ref 5–15)
BUN: 20 mg/dL (ref 8–23)
CO2: 20 mmol/L — ABNORMAL LOW (ref 22–32)
Calcium: 8.8 mg/dL — ABNORMAL LOW (ref 8.9–10.3)
Chloride: 110 mmol/L (ref 98–111)
Creatinine, Ser: 2.01 mg/dL — ABNORMAL HIGH (ref 0.61–1.24)
GFR, Estimated: 34 mL/min — ABNORMAL LOW (ref >60)
Glucose, Bld: 190 mg/dL — ABNORMAL HIGH (ref 70–99)
Potassium: 4.4 mmol/L (ref 3.5–5.1)
Sodium: 139 mmol/L (ref 135–145)
Total Bilirubin: 0.8 mg/dL (ref 0.3–1.2)
Total Protein: 6.1 g/dL — ABNORMAL LOW (ref 6.5–8.1)

## 2021-10-05 LAB — I-STAT CHEM 8, ED
BUN: 29 mg/dL — ABNORMAL HIGH (ref 8–23)
Calcium, Ion: 1.12 mmol/L — ABNORMAL LOW (ref 1.15–1.40)
Chloride: 110 mmol/L (ref 98–111)
Creatinine, Ser: 2 mg/dL — ABNORMAL HIGH (ref 0.61–1.24)
Glucose, Bld: 186 mg/dL — ABNORMAL HIGH (ref 70–99)
HCT: 39 % (ref 39.0–52.0)
Hemoglobin: 13.3 g/dL (ref 13.0–17.0)
Potassium: 5.2 mmol/L — ABNORMAL HIGH (ref 3.5–5.1)
Sodium: 140 mmol/L (ref 135–145)
TCO2: 24 mmol/L (ref 22–32)

## 2021-10-05 LAB — TROPONIN I (HIGH SENSITIVITY): Troponin I (High Sensitivity): 259 ng/L (ref <18)

## 2021-10-05 MED ORDER — ASPIRIN 81 MG PO CHEW
324.0000 mg | CHEWABLE_TABLET | Freq: Once | ORAL | Status: AC
  Administered 2021-10-06: 324 mg via ORAL
  Filled 2021-10-05: qty 4

## 2021-10-05 MED ORDER — NOREPINEPHRINE 4 MG/250ML-% IV SOLN
0.0000 ug/min | INTRAVENOUS | Status: DC
  Administered 2021-10-05: 2 ug/min via INTRAVENOUS
  Administered 2021-10-06: 3 ug/min via INTRAVENOUS
  Filled 2021-10-05: qty 250

## 2021-10-05 MED ORDER — SODIUM CHLORIDE 0.9 % IV BOLUS
1000.0000 mL | Freq: Once | INTRAVENOUS | Status: AC
  Administered 2021-10-05: 1000 mL via INTRAVENOUS

## 2021-10-05 NOTE — ED Triage Notes (Signed)
Pt to ED via EMS. Pt states he was sitting at home on couch with wife watching TV when he began feeling sweaty, tired, weak, and started having some chest discomfort. Pt states he went to bathroom and had one episode of diarrhea. Pt was lethargic, diaphoretic, and cool with a BP of 78/40 upon EMS arrival to ED. Pt denies CP or any other pain upon arrival to ED. Pt does take lisinopril at night but states he does not think he took an extra pill. Pt also c/o SOB upon excretion. EMS placed pt on 2L [place] for comfort.  EMS gave 1000cc NS bolus en route which brought BP up to 102/70.   EMS vitals: 20LAC 182 CBG 102/70 8-s HR 98% on 2L [place]

## 2021-10-05 NOTE — ED Provider Notes (Signed)
[facility] Provider Note   CSN: [id number] Arrival date & time: 10/05/21  2252     History {Add pertinent medical, surgical, social history, OB history to HPI:1} Chief Complaint  Patient presents with   Hypotension    Douglas Hoffman is a 76 y.o. male.  The history is provided by the patient, the EMS personnel and medical records.  Douglas Hoffman is a 76 y.o. male who presents to the Emergency Department complaining of weakness.  He presents to the emergency department by EMS from home for evaluation of feeling poorly.  This happened around 830pm while he was watching a ball game.  He developed diaphoresis, central chest pain and had 1 episode of bowel incontinence.  Per EMS he had a blood pressure of 78/40.  He received 1000 cc of normal saline prior to ED arrival.  He states at time of ED arrival his chest pain is gone.  He does feel improved but continues to feel generally unwell.  He has a history of hypertension and sometimes takes his medications.  He did take 2 doses of his blood pressure medication today.  No tobacco, alcohol, drug use.    Home Medications Prior to Admission medications   Medication Sig Start Date End Date Taking? Authorizing Provider  acetaminophen (TYLENOL 8 HOUR) 650 MG CR tablet Take 1,300 mg by mouth every 8 (eight) hours as needed.     [provider]  acetaminophen (TYLENOL) 325 MG tablet Take 650 mg by mouth every 6 (six) hours as needed.     [provider]  allopurinol (ZYLOPRIM) 100 MG tablet Take 100 mg by mouth daily.    [provider]  amLODipine (NORVASC) 5 MG tablet Take 5 mg by mouth daily.    [provider]  apixaban (ELIQUIS) 2.5 MG TABS tablet Take 1 tablet (2.5 mg total) by mouth 2 (two) times daily. 07/08/18   Swinteck, Aaron Edelman, MD  aspirin 81 MG chewable tablet Chew 81 mg by mouth daily.    [provider]  cholecalciferol (VITAMIN D) 1000 UNITS tablet Take 1,000  Units by mouth daily.    [provider]  docusate sodium (COLACE) 100 MG capsule Take 1 capsule (100 mg total) by mouth 2 (two) times daily. Patient not taking: Reported on 06/06/2019 07/08/18   Rod Can, MD  HYDROcodone-acetaminophen (NORCO/VICODIN) 5-325 MG tablet Take 1 tablet by mouth every 4 (four) hours as needed for moderate pain (pain score 4-6). Patient not taking: Reported on 06/06/2019 07/08/18   Rod Can, MD  lisinopril (PRINIVIL,ZESTRIL) 20 MG tablet Take 20 mg by mouth daily.    [provider]  Multiple Vitamin (MULTIVITAMIN) tablet Take 1 tablet by mouth daily. Centrum daily    [provider]  ondansetron (ZOFRAN) 4 MG tablet Take 1 tablet (4 mg total) by mouth every 6 (six) hours as needed for nausea. Patient not taking: Reported on 06/06/2019 07/08/18   Rod Can, MD  senna (SENOKOT) 8.6 MG TABS tablet Take 2 tablets (17.2 mg total) by mouth at bedtime. Patient not taking: Reported on 06/06/2019 07/08/18   Rod Can, MD      Allergies    Patient has no known allergies.    Review of Systems   Review of Systems  All other systems reviewed and are negative.  Physical Exam Updated Vital Signs BP (!) 87/69   Pulse 91   Resp 16   Ht 6' (1.829 m)   Wt 89.8 kg  SpO2 97%   BMI 26.85 kg/m  Physical Exam Vitals and nursing note reviewed.  Constitutional:      General: He is in acute distress.     Appearance: He is well-developed. He is ill-appearing.  HENT:     Head: Normocephalic and atraumatic.  Cardiovascular:     Rate and Rhythm: Normal rate and regular rhythm.     Heart sounds: No murmur heard. Pulmonary:     Effort: Pulmonary effort is normal. No respiratory distress.     Breath sounds: Normal breath sounds.  Abdominal:     Palpations: Abdomen is soft.     Tenderness: There is no abdominal tenderness. There is no guarding or rebound.  Musculoskeletal:        General: No tenderness.  Skin:    General: Skin is warm  and dry.     Coloration: Skin is pale.  Neurological:     Mental Status: He is alert and oriented to person, place, and time.  Psychiatric:        Behavior: Behavior normal.    ED Results / Procedures / Treatments   Labs (all labs ordered are listed, but only abnormal results are displayed) Labs Reviewed  COMPREHENSIVE METABOLIC PANEL  PROTIME-INR  CBC WITH DIFFERENTIAL/PLATELET  I-STAT CHEM 8, ED  TROPONIN I (HIGH SENSITIVITY)    EKG EKG Interpretation  Date/Time:  Sunday October 05 2021 23:03:32 EDT Ventricular Rate:  87 PR Interval:  180 QRS Duration: 121 QT Interval:  368 QTC Calculation: 443 R Axis:   92 Text Interpretation: Sinus arrhythmia Nonspecific intraventricular conduction delay Confirmed by Quintella Reichert [phone]) on 10/05/2021 11:11:00 PM  Radiology No results found.  Procedures Procedures  {Document cardiac monitor, telemetry assessment procedure when appropriate:1}  Medications Ordered in ED Medications  sodium chloride 0.9 % bolus 1,000 mL (1,000 mLs Intravenous New Bag/Given 10/05/21 2306)    ED Course/ Medical Decision Making/ A&P                           Medical Decision Making Amount and/or Complexity of Data Reviewed Labs: ordered. Radiology: ordered.   ***  {Document critical care time when appropriate:1} {Document review of labs and clinical decision tools ie heart score, Chads2Vasc2 etc:1}  {Document your independent review of radiology images, and any outside records:1} {Document your discussion with family members, caretakers, and with consultants:1} {Document social determinants of health affecting pt's care:1} {Document your decision making why or why not admission, treatments were needed:1} Final Clinical Impression(s) / ED Diagnoses Final diagnoses:  None    Rx / DC Orders ED Discharge Orders     None

## 2021-10-06 ENCOUNTER — Inpatient Hospital Stay (HOSPITAL_COMMUNITY): Payer: PPO

## 2021-10-06 ENCOUNTER — Emergency Department (HOSPITAL_COMMUNITY): Payer: PPO

## 2021-10-06 DIAGNOSIS — R042 Hemoptysis: Secondary | ICD-10-CM | POA: Diagnosis present

## 2021-10-06 DIAGNOSIS — I248 Other forms of acute ischemic heart disease: Secondary | ICD-10-CM | POA: Diagnosis present

## 2021-10-06 DIAGNOSIS — Z923 Personal history of irradiation: Secondary | ICD-10-CM | POA: Diagnosis not present

## 2021-10-06 DIAGNOSIS — Z79899 Other long term (current) drug therapy: Secondary | ICD-10-CM | POA: Diagnosis not present

## 2021-10-06 DIAGNOSIS — Z8546 Personal history of malignant neoplasm of prostate: Secondary | ICD-10-CM | POA: Diagnosis not present

## 2021-10-06 DIAGNOSIS — R42 Dizziness and giddiness: Secondary | ICD-10-CM | POA: Diagnosis not present

## 2021-10-06 DIAGNOSIS — I129 Hypertensive chronic kidney disease with stage 1 through stage 4 chronic kidney disease, or unspecified chronic kidney disease: Secondary | ICD-10-CM | POA: Diagnosis present

## 2021-10-06 DIAGNOSIS — Z7901 Long term (current) use of anticoagulants: Secondary | ICD-10-CM | POA: Diagnosis not present

## 2021-10-06 DIAGNOSIS — I2699 Other pulmonary embolism without acute cor pulmonale: Secondary | ICD-10-CM

## 2021-10-06 DIAGNOSIS — N1832 Chronic kidney disease, stage 3b: Secondary | ICD-10-CM | POA: Diagnosis present

## 2021-10-06 DIAGNOSIS — X58XXXA Exposure to other specified factors, initial encounter: Secondary | ICD-10-CM | POA: Diagnosis present

## 2021-10-06 DIAGNOSIS — R7303 Prediabetes: Secondary | ICD-10-CM | POA: Insufficient documentation

## 2021-10-06 DIAGNOSIS — D696 Thrombocytopenia, unspecified: Secondary | ICD-10-CM | POA: Diagnosis present

## 2021-10-06 DIAGNOSIS — I214 Non-ST elevation (NSTEMI) myocardial infarction: Secondary | ICD-10-CM | POA: Diagnosis present

## 2021-10-06 DIAGNOSIS — Y929 Unspecified place or not applicable: Secondary | ICD-10-CM | POA: Diagnosis not present

## 2021-10-06 DIAGNOSIS — Z86718 Personal history of other venous thrombosis and embolism: Secondary | ICD-10-CM | POA: Diagnosis not present

## 2021-10-06 DIAGNOSIS — Z96641 Presence of right artificial hip joint: Secondary | ICD-10-CM | POA: Diagnosis present

## 2021-10-06 DIAGNOSIS — R079 Chest pain, unspecified: Secondary | ICD-10-CM | POA: Diagnosis not present

## 2021-10-06 DIAGNOSIS — E872 Acidosis, unspecified: Secondary | ICD-10-CM | POA: Diagnosis present

## 2021-10-06 DIAGNOSIS — Z96652 Presence of left artificial knee joint: Secondary | ICD-10-CM | POA: Diagnosis present

## 2021-10-06 DIAGNOSIS — N170 Acute kidney failure with tubular necrosis: Secondary | ICD-10-CM | POA: Diagnosis present

## 2021-10-06 DIAGNOSIS — Z7982 Long term (current) use of aspirin: Secondary | ICD-10-CM | POA: Diagnosis not present

## 2021-10-06 DIAGNOSIS — I2609 Other pulmonary embolism with acute cor pulmonale: Secondary | ICD-10-CM | POA: Diagnosis not present

## 2021-10-06 DIAGNOSIS — I361 Nonrheumatic tricuspid (valve) insufficiency: Secondary | ICD-10-CM

## 2021-10-06 DIAGNOSIS — E861 Hypovolemia: Secondary | ICD-10-CM | POA: Diagnosis present

## 2021-10-06 DIAGNOSIS — I2721 Secondary pulmonary arterial hypertension: Secondary | ICD-10-CM | POA: Diagnosis present

## 2021-10-06 DIAGNOSIS — R579 Shock, unspecified: Secondary | ICD-10-CM | POA: Diagnosis present

## 2021-10-06 DIAGNOSIS — N179 Acute kidney failure, unspecified: Secondary | ICD-10-CM

## 2021-10-06 DIAGNOSIS — D689 Coagulation defect, unspecified: Secondary | ICD-10-CM | POA: Diagnosis present

## 2021-10-06 DIAGNOSIS — E875 Hyperkalemia: Secondary | ICD-10-CM | POA: Diagnosis present

## 2021-10-06 DIAGNOSIS — I2510 Atherosclerotic heart disease of native coronary artery without angina pectoris: Secondary | ICD-10-CM | POA: Diagnosis present

## 2021-10-06 DIAGNOSIS — R739 Hyperglycemia, unspecified: Secondary | ICD-10-CM | POA: Insufficient documentation

## 2021-10-06 DIAGNOSIS — E1122 Type 2 diabetes mellitus with diabetic chronic kidney disease: Secondary | ICD-10-CM | POA: Diagnosis present

## 2021-10-06 DIAGNOSIS — E1165 Type 2 diabetes mellitus with hyperglycemia: Secondary | ICD-10-CM | POA: Diagnosis present

## 2021-10-06 DIAGNOSIS — R571 Hypovolemic shock: Secondary | ICD-10-CM | POA: Diagnosis present

## 2021-10-06 HISTORY — PX: IR INFUSION THROMBOL VENOUS INITIAL (MS): IMG5377

## 2021-10-06 HISTORY — PX: IR US GUIDE VASC ACCESS RIGHT: IMG2390

## 2021-10-06 HISTORY — PX: IR ANGIOGRAM SELECTIVE EACH ADDITIONAL VESSEL: IMG667

## 2021-10-06 HISTORY — PX: IR ANGIOGRAM PULMONARY BILATERAL SELECTIVE: IMG664

## 2021-10-06 HISTORY — PX: IR INFUSION THROMBOL ARTERIAL INITIAL (MS): IMG5376

## 2021-10-06 LAB — ECHOCARDIOGRAM COMPLETE
AV Peak grad: 3.1 mmHg
Ao pk vel: 0.89 m/s
Area-P 1/2: 3.76 cm2
Height: 72 in
S' Lateral: 2.3 cm
Weight: 3168 oz

## 2021-10-06 LAB — COMPREHENSIVE METABOLIC PANEL
ALT: 23 U/L (ref 0–44)
AST: 32 U/L (ref 15–41)
Albumin: 2.8 g/dL — ABNORMAL LOW (ref 3.5–5.0)
Alkaline Phosphatase: 71 U/L (ref 38–126)
Anion gap: 7 (ref 5–15)
BUN: 20 mg/dL (ref 8–23)
CO2: 14 mmol/L — ABNORMAL LOW (ref 22–32)
Calcium: 8.2 mg/dL — ABNORMAL LOW (ref 8.9–10.3)
Chloride: 119 mmol/L — ABNORMAL HIGH (ref 98–111)
Creatinine, Ser: 1.75 mg/dL — ABNORMAL HIGH (ref 0.61–1.24)
GFR, Estimated: 40 mL/min — ABNORMAL LOW (ref >60)
Glucose, Bld: 145 mg/dL — ABNORMAL HIGH (ref 70–99)
Potassium: 4.9 mmol/L (ref 3.5–5.1)
Sodium: 140 mmol/L (ref 135–145)
Total Bilirubin: 0.5 mg/dL (ref 0.3–1.2)
Total Protein: 5.3 g/dL — ABNORMAL LOW (ref 6.5–8.1)

## 2021-10-06 LAB — CBC WITH DIFFERENTIAL/PLATELET
Abs Immature Granulocytes: 0.08 10*3/uL — ABNORMAL HIGH (ref 0.00–0.07)
Basophils Absolute: 0 10*3/uL (ref 0.0–0.1)
Basophils Relative: 0 %
Eosinophils Absolute: 0 10*3/uL (ref 0.0–0.5)
Eosinophils Relative: 0 %
HCT: 36.5 % — ABNORMAL LOW (ref 39.0–52.0)
Hemoglobin: 11.8 g/dL — ABNORMAL LOW (ref 13.0–17.0)
Immature Granulocytes: 1 %
Lymphocytes Relative: 11 %
Lymphs Abs: 0.9 10*3/uL (ref 0.7–4.0)
MCH: 31.3 pg (ref 26.0–34.0)
MCHC: 32.3 g/dL (ref 30.0–36.0)
MCV: 96.8 fL (ref 80.0–100.0)
Monocytes Absolute: 0.6 10*3/uL (ref 0.1–1.0)
Monocytes Relative: 7 %
Neutro Abs: 6.8 10*3/uL (ref 1.7–7.7)
Neutrophils Relative %: 81 %
Platelets: 96 10*3/uL — ABNORMAL LOW (ref 150–400)
RBC: 3.77 MIL/uL — ABNORMAL LOW (ref 4.22–5.81)
RDW: 13.5 % (ref 11.5–15.5)
WBC: 8.4 10*3/uL (ref 4.0–10.5)
nRBC: 0 % (ref 0.0–0.2)

## 2021-10-06 LAB — CBC
HCT: 37.2 % — ABNORMAL LOW (ref 39.0–52.0)
Hemoglobin: 11.7 g/dL — ABNORMAL LOW (ref 13.0–17.0)
MCH: 30.9 pg (ref 26.0–34.0)
MCHC: 31.5 g/dL (ref 30.0–36.0)
MCV: 98.2 fL (ref 80.0–100.0)
Platelets: 102 10*3/uL — ABNORMAL LOW (ref 150–400)
RBC: 3.79 MIL/uL — ABNORMAL LOW (ref 4.22–5.81)
RDW: 13.7 % (ref 11.5–15.5)
WBC: 10.9 10*3/uL — ABNORMAL HIGH (ref 4.0–10.5)
nRBC: 0 % (ref 0.0–0.2)

## 2021-10-06 LAB — CBG MONITORING, ED
Glucose-Capillary: 143 mg/dL — ABNORMAL HIGH (ref 70–99)
Glucose-Capillary: 114 mg/dL — ABNORMAL HIGH (ref 70–99)
Glucose-Capillary: 89 mg/dL (ref 70–99)

## 2021-10-06 LAB — BRAIN NATRIURETIC PEPTIDE: B Natriuretic Peptide: 71.7 pg/mL (ref 0.0–100.0)

## 2021-10-06 LAB — MAGNESIUM
Magnesium: 1.9 mg/dL (ref 1.7–2.4)
Magnesium: 1.9 mg/dL (ref 1.7–2.4)

## 2021-10-06 LAB — LIPID PANEL
Cholesterol: 163 mg/dL (ref 0–200)
HDL: 34 mg/dL — ABNORMAL LOW (ref >40)
LDL Cholesterol: 125 mg/dL — ABNORMAL HIGH (ref 0–99)
Total CHOL/HDL Ratio: 4.8 RATIO
Triglycerides: 21 mg/dL (ref <150)
VLDL: 4 mg/dL (ref 0–40)

## 2021-10-06 LAB — HEMOGLOBIN A1C
Hgb A1c MFr Bld: 6.1 % — ABNORMAL HIGH (ref 4.8–5.6)
Mean Plasma Glucose: 128.37 mg/dL

## 2021-10-06 LAB — GLUCOSE, CAPILLARY
Glucose-Capillary: 125 mg/dL — ABNORMAL HIGH (ref 70–99)
Glucose-Capillary: 88 mg/dL (ref 70–99)
Glucose-Capillary: 94 mg/dL (ref 70–99)

## 2021-10-06 LAB — D-DIMER, QUANTITATIVE: D-Dimer, Quant: >20 ug/mL-FEU — ABNORMAL HIGH (ref 0.00–0.50)

## 2021-10-06 LAB — TROPONIN I (HIGH SENSITIVITY)
Troponin I (High Sensitivity): 869 ng/L (ref <18)
Troponin I (High Sensitivity): 806 ng/L (ref <18)
Troponin I (High Sensitivity): 810 ng/L (ref <18)

## 2021-10-06 LAB — LACTIC ACID, PLASMA
Lactic Acid, Venous: 2.8 mmol/L (ref 0.5–1.9)
Lactic Acid, Venous: 3.5 mmol/L (ref 0.5–1.9)

## 2021-10-06 LAB — PROCALCITONIN: Procalcitonin: <0.1 ng/mL

## 2021-10-06 LAB — TSH: TSH: 0.601 u[IU]/mL (ref 0.350–4.500)

## 2021-10-06 LAB — FIBRINOGEN: Fibrinogen: 267 mg/dL (ref 210–475)

## 2021-10-06 LAB — MRSA NEXT GEN BY PCR, NASAL: MRSA by PCR Next Gen: NOT DETECTED

## 2021-10-06 LAB — HEPARIN LEVEL (UNFRACTIONATED)
Heparin Unfractionated: 0.46 IU/mL (ref 0.30–0.70)
Heparin Unfractionated: 0.71 IU/mL — ABNORMAL HIGH (ref 0.30–0.70)

## 2021-10-06 LAB — PHOSPHORUS: Phosphorus: 2.6 mg/dL (ref 2.5–4.6)

## 2021-10-06 MED ORDER — MELATONIN 3 MG PO TABS
3.0000 mg | ORAL_TABLET | Freq: Every evening | ORAL | Status: DC | PRN
  Administered 2021-10-06 – 2021-10-10 (×3): 3 mg via ORAL
  Filled 2021-10-06 (×3): qty 1

## 2021-10-06 MED ORDER — LIDOCAINE HCL 1 % IJ SOLN
INTRAMUSCULAR | Status: AC
  Filled 2021-10-06: qty 20

## 2021-10-06 MED ORDER — POLYETHYLENE GLYCOL 3350 17 G PO PACK: 17.0000 g | PACK | Freq: Every day | ORAL | Status: DC | PRN

## 2021-10-06 MED ORDER — ATORVASTATIN CALCIUM 80 MG PO TABS
80.0000 mg | ORAL_TABLET | Freq: Every day | ORAL | Status: DC
  Administered 2021-10-06 – 2021-10-11 (×6): 80 mg via ORAL
  Filled 2021-10-06 (×3): qty 1
  Filled 2021-10-06: qty 2
  Filled 2021-10-06 (×2): qty 1

## 2021-10-06 MED ORDER — HEPARIN (PORCINE) 25000 UT/250ML-% IV SOLN
1050.0000 [IU]/h | INTRAVENOUS | Status: DC
  Administered 2021-10-06: 1050 [IU]/h via INTRAVENOUS
  Administered 2021-10-06: 1100 [IU]/h via INTRAVENOUS
  Administered 2021-10-08: 1050 [IU]/h via INTRAVENOUS
  Filled 2021-10-06 (×3): qty 250

## 2021-10-06 MED ORDER — ASPIRIN 81 MG PO TBEC
81.0000 mg | DELAYED_RELEASE_TABLET | Freq: Every day | ORAL | Status: DC
  Administered 2021-10-06 – 2021-10-09 (×4): 81 mg via ORAL
  Filled 2021-10-06 (×4): qty 1

## 2021-10-06 MED ORDER — SODIUM CHLORIDE 0.9 % IV SOLN: INTRAVENOUS | Status: DC

## 2021-10-06 MED ORDER — MIDAZOLAM HCL 2 MG/2ML IJ SOLN
INTRAMUSCULAR | Status: AC | PRN
  Administered 2021-10-06: .5 mg via INTRAVENOUS
  Administered 2021-10-06: 1 mg via INTRAVENOUS

## 2021-10-06 MED ORDER — IOHEXOL 350 MG/ML SOLN
60.0000 mL | Freq: Once | INTRAVENOUS | Status: AC | PRN
  Administered 2021-10-06: 60 mL via INTRAVENOUS

## 2021-10-06 MED ORDER — MIDAZOLAM HCL 2 MG/2ML IJ SOLN
INTRAMUSCULAR | Status: AC
  Filled 2021-10-06: qty 2

## 2021-10-06 MED ORDER — SODIUM CHLORIDE 0.9 % IV SOLN
250.0000 mL | INTRAVENOUS | Status: DC | PRN
  Administered 2021-10-07: 250 mL via INTRAVENOUS

## 2021-10-06 MED ORDER — CHLORHEXIDINE GLUCONATE CLOTH 2 % EX PADS
6.0000 | MEDICATED_PAD | Freq: Every day | CUTANEOUS | Status: DC
  Administered 2021-10-06 – 2021-10-07 (×2): 6 via TOPICAL

## 2021-10-06 MED ORDER — INSULIN ASPART 100 UNIT/ML IJ SOLN
0.0000 [IU] | INTRAMUSCULAR | Status: DC
  Administered 2021-10-06 – 2021-10-07 (×4): 1 [IU] via SUBCUTANEOUS
  Administered 2021-10-07: 2 [IU] via SUBCUTANEOUS
  Administered 2021-10-08 – 2021-10-11 (×3): 1 [IU] via SUBCUTANEOUS

## 2021-10-06 MED ORDER — SODIUM CHLORIDE 0.9 % IV BOLUS
1000.0000 mL | Freq: Once | INTRAVENOUS | Status: AC
  Administered 2021-10-06: 1000 mL via INTRAVENOUS

## 2021-10-06 MED ORDER — SODIUM CHLORIDE 0.9 % IV SOLN
12.0000 mg | Freq: Once | INTRAVENOUS | Status: AC
  Administered 2021-10-06: 12 mg via INTRAVENOUS
  Filled 2021-10-06: qty 12

## 2021-10-06 MED ORDER — SODIUM CHLORIDE 0.9 % IV SOLN: 12.0000 mg | Freq: Once | INTRAVENOUS | Status: DC

## 2021-10-06 MED ORDER — SODIUM CHLORIDE 0.9% FLUSH
3.0000 mL | Freq: Two times a day (BID) | INTRAVENOUS | Status: DC
  Administered 2021-10-08 – 2021-10-10 (×5): 3 mL via INTRAVENOUS

## 2021-10-06 MED ORDER — FENTANYL CITRATE (PF) 100 MCG/2ML IJ SOLN
INTRAMUSCULAR | Status: AC
  Filled 2021-10-06: qty 2

## 2021-10-06 MED ORDER — DOCUSATE SODIUM 100 MG PO CAPS: 100.0000 mg | ORAL_CAPSULE | Freq: Two times a day (BID) | ORAL | Status: DC | PRN

## 2021-10-06 MED ORDER — SODIUM ZIRCONIUM CYCLOSILICATE 10 G PO PACK: 10.0000 g | PACK | Freq: Once | ORAL | Status: DC

## 2021-10-06 MED ORDER — SODIUM CHLORIDE 0.9% FLUSH: 3.0000 mL | INTRAVENOUS | Status: DC | PRN

## 2021-10-06 MED ORDER — PERFLUTREN LIPID MICROSPHERE
1.0000 mL | INTRAVENOUS | Status: AC | PRN
  Administered 2021-10-06: 4 mL via INTRAVENOUS

## 2021-10-06 MED ORDER — HEPARIN BOLUS VIA INFUSION
4000.0000 [IU] | Freq: Once | INTRAVENOUS | Status: AC
  Administered 2021-10-06: 4000 [IU] via INTRAVENOUS
  Filled 2021-10-06: qty 4000

## 2021-10-06 MED ORDER — PROCHLORPERAZINE EDISYLATE 10 MG/2ML IJ SOLN
10.0000 mg | Freq: Four times a day (QID) | INTRAMUSCULAR | Status: DC | PRN
  Filled 2021-10-06: qty 2

## 2021-10-06 MED ORDER — SODIUM CHLORIDE 0.9 % IV SOLN: INTRAVENOUS | Status: DC | PRN

## 2021-10-06 MED ORDER — ACETAMINOPHEN 325 MG PO TABS: 650.0000 mg | ORAL_TABLET | Freq: Four times a day (QID) | ORAL | Status: DC | PRN

## 2021-10-06 MED ORDER — FENTANYL CITRATE (PF) 100 MCG/2ML IJ SOLN
INTRAMUSCULAR | Status: AC | PRN
  Administered 2021-10-06: 25 ug via INTRAVENOUS
  Administered 2021-10-06: 50 ug via INTRAVENOUS

## 2021-10-06 NOTE — Progress Notes (Addendum)
Dr Tacy Learn aware of BP 140/100-110.  BP ok unless SBP >160.per Dr. Tacy Learn,

## 2021-10-06 NOTE — Progress Notes (Signed)
Brief same day note:  Patient is a 76 year old male with history of hypertension, gout, DVT off anticoagulation who was brought to the emergency department from home due to sudden onset of chest discomfort, weakness, diaphoresis.  Patient was sitting down watching TV when he started feeling chest discomfort and became diaphoretic.  Brought by wife to the emergency department.  When EMS arrived, he was hypotensive.  On presentation low-dose Levophed was started for maintenance of blood pressure.  Lab work showed elevated troponin, very high D-dimer, AKI with creatinine in the range of 2, elevated lactic acid level.  Suspected NSTEMI, started on heparin drip, cardiology consulted.  CTA chest done today showed acute bilateral proximal occlusive pulmonary emboli with severe clot burden., CT evidence of right heart strain.  PCCM consulted, being continued on IV heparin. Patient seen and examined at the bedside this morning.  During my evaluation, he was hemodynamically stable, appeared comfortable, alert and awake and oriented.  Denies any chest pain or shortness of breath.  Assessment and plan  Acute submassive PE: Presented with hypotension, chest pain, diaphoresis.  Elevated D-dimer.CTA chest done today showed acute bilateral proximal occlusive pulmonary emboli with severe clot burden., CT evidence of right heart strain.  PCCM consulted, being continued on IV heparin.  High risk for decompensation.  He might be candidate for thrombolysis.  Elevated troponin/NSTEMI: Due to PE.  Continue heparin drip.  Cardiology following.  LDL of 195.  Hemoglobin shows 6.1.  No chest pain this morning.  Also on aspirin, Lipitor started by the admitting physician.  2D echo pending.  Hypotension/shock: Secondary to massive PE.  Initially started on low-dose Levophed.  Currently hemodynamically stable.  Antihypertensives on hold  AKI on CKD stage IIIb: Baseline creatinine around 1.6.  Presented with creatinine of more than  2.  Continue gentle IV fluids.  Kidney function improving.  Hyperkalemia: Given Lokelma  Type 2 diabetes with hyperglycemia: Hemoglobin A1c of 6.1.  Generalized weakness: We will consult PT/OT when appropriate  I called patient's spouse Ms. Narciso twice on phone for update, she was not available.

## 2021-10-06 NOTE — ED Notes (Signed)
Pt transported to IR 

## 2021-10-06 NOTE — Consult Note (Signed)
Chief Complaint: Bilateral PE with right heart stain. Request is for PE Lysis  Referring Physician(s): Dr. Tacy Learn  Supervising Physician: Arne Cleveland  Patient Status: [facility] - In-pt  History of Present Illness: Douglas Hoffman is a 76 y.o. male  inpatient. History of HTN, gout, DVT ( not on anticoagulation). Presented to the ED at [facility] on 6.4.23 with sudden onset of chest discomfort and generalized weakness. Found to be hypotensive with elevated troponin, elevated D-Dimer and  bilateral PE. CT chest from 6.5.23 reads Acute bilateral pulmonary emboli. Pulmonary emboli are proximal and occlusive with severe clot burden. Positive for acute PE with CT evidence of right heart strain (RV/LV Ratio = 2.5). Echo from 6.5.23 reads Right Ventricle: The right ventricular size is mildly enlarged. No increase in right ventricular wall thickness. Right ventricular systolic function is mildly reduced. There is moderately elevated pulmonary artery  systolic pressure. The tricuspid regurgitant velocity is 2.89 m/s, and with an assumed right atrial  pressure of 15 mmHg, the estimated right ventricular systolic pressure is 98.3 mmHg. No recent venous doppler. Team is requests PE lysis.  Wife at bedside. Currently without any significant complaints. Patient alert and laying in bed, calm and comfortable. Denies any fevers, headache, chest pain, SOB, cough, abdominal pain, nausea, vomiting or bleeding. Return precautions and treatment recommendations and follow-up discussed with the patient  who is agreeable with the plan.  Past Medical History:  Diagnosis Date   Acute venous embolism and thrombosis of unspecified deep vessels of lower extremity    Arthritis    knee    Cancer (HCC)    Clotting disorder (HCC)    DVT many years  ago    Glaucoma    left eye since age 54   Gout    Hip pain    History of radiation therapy    Prostate   Hypertension    Prostate cancer [facility])    Renal disorder    reports prior  to joint replacement surgery , was taking 5 to 6 aleve daily for pain releif, report shas stopped this and kidney function has omporved, kidney fx followed by his PCP       Past Surgical History:  Procedure Laterality Date   COLONOSCOPY  08/14/2010   normal    JOINT REPLACEMENT     RIGHT Hip Replacement   JOINT REPLACEMENT     Knee Replacement   SUPRAPUBIC PROSTATECTOMY     TOTAL KNEE REVISION Left 07/07/2018   Procedure: TOTAL KNEE REVISION LEFT KNEE ARTHROPLASTY WITH FEMORAL AND TIBIAL COMPONENTS;  Surgeon: Rod Can, MD;  Location: [facility] ORS;  Service: Orthopedics;  Laterality: Left;    Allergies: Patient has no known allergies.  Medications: Prior to Admission medications   Medication Sig Start Date End Date Taking? Authorizing Provider  lisinopril (PRINIVIL,ZESTRIL) 20 MG tablet Take 20 mg by mouth daily.   Yes [provider]  allopurinol (ZYLOPRIM) 100 MG tablet Take 100 mg by mouth daily.    [provider]  amLODipine (NORVASC) 5 MG tablet Take 5 mg by mouth daily.    [provider]  apixaban (ELIQUIS) 2.5 MG TABS tablet Take 1 tablet (2.5 mg total) by mouth 2 (two) times daily. 07/08/18   Swinteck, Aaron Edelman, MD  aspirin 81 MG chewable tablet Chew 81 mg by mouth daily.    [provider]  cholecalciferol (VITAMIN D) 1000 UNITS tablet Take 1,000 Units by mouth daily.    [provider]  Multiple Vitamin (MULTIVITAMIN) tablet  Take 1 tablet by mouth daily. Centrum daily    [provider]  triamcinolone cream (KENALOG) 0.1 % Apply 1 application. topically 2 (two) times daily as needed for rash. 10/04/21   [provider]     Family History  Problem Relation Age of Onset   Colon cancer Neg Hx    Colon polyps Neg Hx    Esophageal cancer Neg Hx    Rectal cancer Neg Hx    Stomach cancer Neg Hx     Social History   Socioeconomic History   Marital status: Married    Spouse name: Not on file   Number of children:  Not on file   Years of education: Not on file   Highest education level: Not on file  Occupational History   Not on file  Tobacco Use   Smoking status: Never   Smokeless tobacco: Never  Vaping Use   Vaping Use: Never used  Substance and Sexual Activity   Alcohol use: No   Drug use: No   Sexual activity: Not on file  Other Topics Concern   Not on file  Social History Narrative   Not on file   Social Determinants of Health   Financial Resource Strain: Not on file  Food Insecurity: Not on file  Transportation Needs: Not on file  Physical Activity: Not on file  Stress: Not on file  Social Connections: Not on file    Review of Systems: A 12 point ROS discussed and pertinent positives are indicated in the HPI above.  All other systems are negative.  Review of Systems  Constitutional:  Negative for fever.  HENT:  Negative for congestion.   Respiratory:  Negative for cough and shortness of breath.   Cardiovascular:  Negative for chest pain.  Gastrointestinal:  Negative for abdominal pain.  Neurological:  Negative for headaches.  Psychiatric/Behavioral:  Negative for behavioral problems and confusion.    Vital Signs: BP 117/80   Pulse 66   Temp 97.6 F (36.4 C) (Oral)   Resp 13   Ht 6' (1.829 m)   Wt 198 lb (89.8 kg)   SpO2 95%   BMI 26.85 kg/m   Physical Exam Vitals and nursing note reviewed.  Constitutional:      Appearance: He is well-developed.  HENT:     Head: Normocephalic.  Cardiovascular:     Rate and Rhythm: Regular rhythm. Tachycardia present.     Heart sounds: Normal heart sounds.     Comments: Intermittently tachycardic Pulmonary:     Effort: Pulmonary effort is normal.     Breath sounds: Normal breath sounds.  Musculoskeletal:        General: Normal range of motion.     Cervical back: Normal range of motion.  Skin:    General: Skin is dry.  Neurological:     Mental Status: He is alert and oriented to person, place, and time.     Imaging: DG Abd 1 View  Result Date: 10/06/2021 CLINICAL DATA:  Diarrhea. EXAM: ABDOMEN - 1 VIEW COMPARISON:  February 22, 2012 FINDINGS: A paucity of bowel gas is noted throughout the abdomen with a small amount of aerated bowel seen within the mid to upper right abdomen and left lower quadrant. No radio-opaque calculi or other significant radiographic abnormality are seen. 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 IMPRESSION: Paucity of bowel gas throughout the abdomen without evidence of bowel obstruction. Electronically Signed  By: Virgina Norfolk M.D.   On: 10/06/2021 04:10   CT Angio Chest Pulmonary Embolism (PE) W or WO Contrast  Addendum Date: 10/06/2021   ADDENDUM REPORT: 10/06/2021 12:39 ADDENDUM: Critical Value/emergent results were called by telephone at the time of interpretation on 10/06/2021 at 12:30 pm to provider Dr. Tawanna Solo, who verbally acknowledged these results. Initial attempts to contact the emergency department begun at 12:08 p.m. Electronically Signed   By: Suzy Bouchard M.D.   On: 10/06/2021 12:39   Result Date: 10/06/2021 CLINICAL DATA:  Elevated D-dimer.  Concern for pulmonary embolism. EXAM: CT ANGIOGRAPHY CHEST WITH CONTRAST TECHNIQUE: Multidetector CT imaging of the chest was performed using the standard protocol during bolus administration of intravenous contrast. Multiplanar CT image reconstructions and MIPs were obtained to evaluate the vascular anatomy. RADIATION DOSE REDUCTION: This exam was performed according to the departmental dose-optimization program which includes automated exposure control, adjustment of the mA and/or kV according to patient size and/or use of iterative reconstruction technique. CONTRAST:  43m OMNIPAQUE IOHEXOL 350 MG/ML SOLN COMPARISON:  None Available. FINDINGS: Cardiovascular: Bilateral large filling defects within the proximal LEFT and RIGHT main pulmonary arteries  consistent acute pulmonary emboli. These emboli are occlusive and extend into the bilateral lower lobes and bilateral upper lobes. RIGHT ventricular diameter to LEFT ventricular diameter ratio (5.8 : 2.3) = 2.5 Mediastinum/Nodes: No axillary or supraclavicular adenopathy. No mediastinal or hilar adenopathy. No pericardial fluid. Esophagus normal. Lungs/Pleura: No pulmonary infarction. Bilateral small pleural effusions. No pneumothorax. Upper Abdomen: Limited view of the liver, kidneys, pancreas are unremarkable. Normal adrenal glands. Musculoskeletal: No aggressive osseous lesion. Chronic elevation of the LEFT hemidiaphragm. Review of the MIP images confirms the above findings. IMPRESSION: 1. Acute bilateral pulmonary emboli. Pulmonary emboli are proximal and occlusive with severe clot burden. 2. Positive for acute PE with CT evidence of right heart strain (RV/LV Ratio = 2.5) consistent with at least submassive (intermediate risk) PE. The presence of right heart strain has been associated with an increased risk of morbidity and mortality. Please refer to the "Code PE Focused" order set in EPIC. 3. Chronic elevation LEFT hemidiaphragm. Electronically Signed: By: SSuzy BouchardM.D. On: 10/06/2021 12:17   DG Chest Port 1 View  Result Date: 10/05/2021 CLINICAL DATA:  CP, hypotension EXAM: PORTABLE CHEST 1 VIEW COMPARISON:  None Available. FINDINGS: The heart and mediastinal contours are within normal limits. Aortic calcification. Low lung volumes and elevated left hemidiaphragm. Left base atelectasis. No focal consolidation. No pulmonary edema. No pleural effusion. No pneumothorax. No acute osseous abnormality. IMPRESSION: 1. Low lung volumes with no active disease. 2.  Aortic Atherosclerosis (ICD10-I70.0). Electronically Signed   By: MIven FinnM.D.   On: 10/05/2021 23:22    Labs:  CBC: Recent Labs    10/05/21 2305 10/05/21 2325 10/06/21 0636  WBC 11.0*  --  8.4  HGB 13.0 13.3 11.8*  HCT 40.2  39.0 36.5*  PLT 113*  --  96*    COAGS: Recent Labs    10/05/21 2305  INR 1.2    BMP: Recent Labs    10/05/21 2305 10/05/21 2325 10/06/21 0636  NA 139 140 140  K 4.4 5.2* 4.9  CL 110 110 119*  CO2 20*  --  14*  GLUCOSE 190* 186* 145*  BUN 20 29* 20  CALCIUM 8.8*  --  8.2*  CREATININE 2.01* 2.00* 1.75*  GFRNONAA 34*  --  40*    LIVER FUNCTION TESTS: Recent Labs    10/05/21 2305 10/06/21  0636  BILITOT 0.8 0.5  AST 28 32  ALT 21 23  ALKPHOS 72 71  PROT 6.1* 5.3*  ALBUMIN 3.3* 2.8*     Assessment and Plan:  76 y.o. male inpatient. History of HTN, gout, DVT ( not on anticoagulation). Presented [facility] ED at [facility] on 6.4.23 with sudden onset of chest discomfort and generalized weakness. Found to be hypotensive with elevated troponin, elevated D-Dimer and  bilateral PE. CT chest from 6.5.23 reads Acute bilateral pulmonary emboli. Pulmonary emboli are proximal and occlusive with severe clot burden. Positive for acute PE with CT evidence of right heart strain (RV/LV Ratio = 2.5). Echo from 6.5.23 reads Right Ventricle: The right ventricular size is mildly enlarged. No  increase in right ventricular wall thickness. Right ventricular systolic function is mildly reduced. There is moderately elevated pulmonary artery  systolic pressure. The tricuspid regurgitant velocity is 2.89 m/s, and with an assumed right atrial  pressure of 15 mmHg, the estimated right ventricular systolic pressure is 35.0 mmHg. Team is request PE lysis.   Patient currently on a heparin gtt. D- Dimer > 20.00 Troponin 810, Cr 1.75, albumin, 2.8, total protein 5.2, GFR < 40. NKDA. Patient   Risks and benefits discussed with the patient including, but not limited to bleeding, possible life threatening bleeding and need for blood product transfusion, vascular injury, stroke, contrast induced renal failure, limb loss and infection.  All of the patient's questions were answered, patient is agreeable to  proceed. Consent signed and in chart.   Thank you for this interesting consult.  I greatly enjoyed meeting [facility] and look forward to participating in their care.  A copy of this report was sent to the requesting provider on this date.  Electronically Signed: Jacqualine Mau, NP 10/06/2021, 1:42 PM   I spent a total of 40 Minutes    in face to face in clinical consultation, greater than 50% of which was counseling/coordinating care for PE Lysis

## 2021-10-06 NOTE — Progress Notes (Signed)
Patient was admitted overnight  76 year old male with hypertension.  DVT in 2015, on not on anticoagulation now who presented with chest pain and generalized weakness.  He was noted to have elevated troponins and lactate was 3.5 Patient was initially treated for NSTEMI  Patient had bedside echocardiogram today which showed RV dilatation and reduced EF with preserved LV function, that suggested patient should go for CT angiogram of the chest  CT angiogram showed large clot burden in bilateral main pulmonary arteries, consistent with submassive PE  PCCM was reconsulted for evaluation and management.  Patient is on room air, not tachycardic though his troponins are elevated now his blood pressure has stabilized without IV vasopressors He has acute kidney injury   Spoke with interventional radiology, decision was to proceed with catheter directed thrombolysis considering patient presentation and hypotension initially.  After catheter is placed patient will be admitted to ICU for close monitoring.   Total critical care time: 32 minutes  Performed by: Dendron care time was exclusive of separately billable procedures and treating other patients.   Critical care was necessary to treat or prevent imminent or life-threatening deterioration.   Critical care was time spent personally by me on the following activities: development of treatment plan with patient and/or surrogate as well as nursing, discussions with consultants, evaluation of patient's response to treatment, examination of patient, obtaining history from patient or surrogate, ordering and performing treatments and interventions, ordering and review of laboratory studies, ordering and review of radiographic studies, pulse oximetry and re-evaluation of patient's condition.   Jacky Kindle, MD [facility] See Amion for pager If no response to pager, please call [phone] until 7pm After 7pm, Please  call E-link [phone]

## 2021-10-06 NOTE — Progress Notes (Signed)
ANTICOAGULATION CONSULT NOTE  Pharmacy Consult for Heparin Indication: chest pain/ACS vs rule out PE  No Known Allergies  Patient Measurements: Height: 6' (182.9 cm) Weight: 89.8 kg (198 lb) IBW/kg (Calculated) : 77.6  Vital Signs: Temp: 97.9 F (36.6 C) (06/05 1744) Temp Source: Oral (06/05 1744) BP: 146/102 (06/05 1800) Pulse Rate: 84 (06/05 1800)  Labs: Recent Labs    10/05/21 2305 10/05/21 2325 10/06/21 0319 10/06/21 0636 10/06/21 0638 10/06/21 1001 10/06/21 1812  HGB 13.0 13.3  --  11.8*  --   --   --   HCT 40.2 39.0  --  36.5*  --   --   --   PLT 113*  --   --  96*  --   --   --   LABPROT 15.1  --   --   --   --   --   --   INR 1.2  --   --   --   --   --   --   HEPARINUNFRC  --   --   --   --   --  0.46 0.71*  CREATININE 2.01* 2.00*  --  1.75*  --   --   --   TROPONINIHS 259*  --  869*  --  806* 810*  --      Estimated Creatinine Clearance: 39.4 mL/min (A) (by C-G formula based on SCr of 1.75 mg/dL (H)).  Assessment: 76 y.o. male with hypotension/weakness and elevated troponin and d-dimer.  Pharmacy consulted to dose IV heparin for ACS vs PE.   Patient has a PE - getting catheter directed TPA infusion  Heparin level slightly supratherapeutic; no bleeding reported.  H/H stable, plts low at 96K (range 110-150s since 2020).  Goal of Therapy:  Heparin level 0.3-0.7 units/ml Monitor platelets by anticoagulation protocol: Yes   Plan:  Decrease heparin infusion at 1050 units/hr Monitor q6hr x 4, then daily heparin levels   Alanda Slim, PharmD, [facility] Clinical Pharmacist Please see AMION for all Pharmacists' Contact Phone Numbers 10/06/2021, 6:58 PM

## 2021-10-06 NOTE — Sedation Documentation (Signed)
Left pulmonary pressure 42/10 22

## 2021-10-06 NOTE — Progress Notes (Signed)
ANTICOAGULATION CONSULT NOTE - Initial Consult  Pharmacy Consult for Heparin Indication: chest pain/ACS  No Known Allergies  Patient Measurements: Height: 6' (182.9 cm) Weight: 89.8 kg (198 lb) IBW/kg (Calculated) : 77.6  Vital Signs: BP: 91/69 (06/04 2330) Pulse Rate: 78 (06/04 2330)  Labs: Recent Labs    10/05/21 2305 10/05/21 2325  HGB 13.0 13.3  HCT 40.2 39.0  PLT 113*  --   LABPROT 15.1  --   INR 1.2  --   CREATININE 2.01* 2.00*  TROPONINIHS 259*  --     Estimated Creatinine Clearance: 34.5 mL/min (A) (by C-G formula based on SCr of 2 mg/dL (H)).   Medical History: Past Medical History:  Diagnosis Date   Acute venous embolism and thrombosis of unspecified deep vessels of lower extremity    Arthritis    knee    Cancer (HCC)    Clotting disorder (HCC)    DVT many years  ago    Glaucoma    left eye since age 53   Gout    Hip pain    History of radiation therapy    Prostate   Hypertension    Prostate cancer [facility])    Renal disorder    reports prior to joint replacement surgery , was taking 5 to 6 aleve daily for pain releif, report shas stopped this and kidney function has omporved, kidney fx followed by his PCP       Medications:  No current facility-administered medications on file prior to encounter.   Current Outpatient Medications on File Prior to Encounter  Medication Sig Dispense Refill   acetaminophen (TYLENOL 8 HOUR) 650 MG CR tablet Take 1,300 mg by mouth every 8 (eight) hours as needed.      acetaminophen (TYLENOL) 325 MG tablet Take 650 mg by mouth every 6 (six) hours as needed.      allopurinol (ZYLOPRIM) 100 MG tablet Take 100 mg by mouth daily.     amLODipine (NORVASC) 5 MG tablet Take 5 mg by mouth daily.     apixaban (ELIQUIS) 2.5 MG TABS tablet Take 1 tablet (2.5 mg total) by mouth 2 (two) times daily. 60 tablet 0   aspirin 81 MG chewable tablet Chew 81 mg by mouth daily.     cholecalciferol (VITAMIN D) 1000 UNITS tablet Take 1,000  Units by mouth daily.     docusate sodium (COLACE) 100 MG capsule Take 1 capsule (100 mg total) by mouth 2 (two) times daily. (Patient not taking: Reported on 06/06/2019) 60 capsule 1   HYDROcodone-acetaminophen (NORCO/VICODIN) 5-325 MG tablet Take 1 tablet by mouth every 4 (four) hours as needed for moderate pain (pain score 4-6). (Patient not taking: Reported on 06/06/2019) 42 tablet 0   lisinopril (PRINIVIL,ZESTRIL) 20 MG tablet Take 20 mg by mouth daily.     Multiple Vitamin (MULTIVITAMIN) tablet Take 1 tablet by mouth daily. Centrum daily     ondansetron (ZOFRAN) 4 MG tablet Take 1 tablet (4 mg total) by mouth every 6 (six) hours as needed for nausea. (Patient not taking: Reported on 06/06/2019) 20 tablet 0   senna (SENOKOT) 8.6 MG TABS tablet Take 2 tablets (17.2 mg total) by mouth at bedtime. (Patient not taking: Reported on 06/06/2019) 60 tablet 1     Assessment: 76 y.o. male with hypotension/weakness, elevated troponin, for heparin  Goal of Therapy:  Heparin level 0.3-0.7 units/ml Monitor platelets by anticoagulation protocol: Yes   Plan:  Heparin 4000 units IV bolus, then start heparin 1100 units/hr  Check heparin level in 8 hours.   Caryl Pina 10/06/2021,12:07 AM

## 2021-10-06 NOTE — Consult Note (Addendum)
Cardiology Consultation:   Patient ID: Jabori Henegar MRN: [id number]; DOB: 1945/08/18  Admit date: 10/05/2021 Date of Consult: 10/06/2021  PCP:  [name], MD   [facility] Providers Cardiologist:  None        Patient Profile:   Mavis Fichera is a 76 y.o. male with a hx of hypertension, DVT=, prostate cancer, CKD who is being seen 10/06/2021 for the evaluation of chest pain at the request of Dr. Nevada Crane.  History of Present Illness:   Mr. Majeed is a 76 year old male with the above medical history who we are consulted for chest pain.  He has a history of DVT in 2015 and was on Coumadin.  This was discontinued in 2020.  He reports last week he flew to [facility].  Was in his usual state of health until last night had acute onset of chest pain.  Describes dull aching pain in center of his chest.  6 out of 10 in intensity.  Lasted about 30 minutes.  Also felt diaphoretic.  Also had an episode of diarrhea.  Felt short of breath.  On EMS arrival, he was lethargic and hypotensive, BP 78/40.  He was given IV fluids.  On arrival to ED, SBP 80s.  Started on low-dose Levophed and IV fluids with improvement.  Initial labs notable for creatinine 2.0 (baseline 1.4-1.7), troponin 259 > 869 > 806.  Lactate 3.5 > 2.8, procalcitonin negative, BNP 72, hemoglobin 11.8, platelets 96, WBC 8.4, TSH 0.6, D-dimer greater than 20.  EKG shows sinus rhythm, rate 86, subtle diffuse concave ST elevations, appears unchanged from prior EKG in 2020.  He is currently chest pain-free, and off pressors, most recent BP 108/83.   Past Medical History:  Diagnosis Date   Acute venous embolism and thrombosis of unspecified deep vessels of lower extremity    Arthritis    knee    Cancer (HCC)    Clotting disorder (HCC)    DVT many years  ago    Glaucoma    left eye since age 21   Gout    Hip pain    History of radiation therapy    Prostate   Hypertension    Prostate cancer [facility])    Renal disorder    reports prior to joint  replacement surgery , was taking 5 to 6 aleve daily for pain releif, report shas stopped this and kidney function has omporved, kidney fx followed by his PCP       Past Surgical History:  Procedure Laterality Date   COLONOSCOPY  08/14/2010   normal    JOINT REPLACEMENT     RIGHT Hip Replacement   JOINT REPLACEMENT     Knee Replacement   SUPRAPUBIC PROSTATECTOMY     TOTAL KNEE REVISION Left 07/07/2018   Procedure: TOTAL KNEE REVISION LEFT KNEE ARTHROPLASTY WITH FEMORAL AND TIBIAL COMPONENTS;  Surgeon: Rod Can, MD;  Location: [facility];  Service: Orthopedics;  Laterality: Left;     Inpatient Medications: Scheduled Meds:  aspirin EC  81 mg Oral Daily   atorvastatin  80 mg Oral Daily   insulin aspart  0-9 Units Subcutaneous Q4H   sodium zirconium cyclosilicate  10 g Oral Once   Continuous Infusions:  sodium chloride 150 mL/hr at 10/06/21 0813   heparin 1,100 Units/hr (10/06/21 0023)   norepinephrine (LEVOPHED) Adult infusion Stopped (10/06/21 0329)   PRN Meds: acetaminophen, melatonin, polyethylene glycol, prochlorperazine  Allergies:   No Known Allergies  Social History:   Social History   Socioeconomic  History   Marital status: Married    Spouse name: Not on file   Number of children: Not on file   Years of education: Not on file   Highest education level: Not on file  Occupational History   Not on file  Tobacco Use   Smoking status: Never   Smokeless tobacco: Never  Vaping Use   Vaping Use: Never used  Substance and Sexual Activity   Alcohol use: No   Drug use: No   Sexual activity: Not on file  Other Topics Concern   Not on file  Social History Narrative   Not on file   Social Determinants of Health   Financial Resource Strain: Not on file  Food Insecurity: Not on file  Transportation Needs: Not on file  Physical Activity: Not on file  Stress: Not on file  Social Connections: Not on file  Intimate Partner Violence: Not on file    Family History:     Family History  Problem Relation Age of Onset   Colon cancer Neg Hx    Colon polyps Neg Hx    Esophageal cancer Neg Hx    Rectal cancer Neg Hx    Stomach cancer Neg Hx      ROS:  Please see the history of present illness.   All other ROS reviewed and negative.     Physical Exam/Data:   Vitals:   10/06/21 0810 10/06/21 0811 10/06/21 0815 10/06/21 0830  BP: 92/72  92/72 108/83  Pulse: 83  75 80  Resp: 17  16 (!) 24  Temp:  97.6 F (36.4 C)    TempSrc:  Oral    SpO2: 99%  97% 100%  Weight:      Height:        Intake/Output Summary (Last 24 hours) at 10/06/2021 0853 Last data filed at 10/06/2021 0256 Gross per 24 hour  Intake 33.9 ml  Output --  Net 33.9 ml      10/05/2021   11:02 PM 06/06/2019    7:09 AM 05/23/2019   12:47 PM  Last 3 Weights  Weight (lbs) 198 lb 204 lb 204 lb  Weight (kg) 89.812 kg 92.534 kg 92.534 kg     Body mass index is 26.85 kg/m.  General:   in no acute distress HEENT: normal Neck: + JVD Cardiac:  normal S1, S2; RRR; no murmur  Lungs:  clear to auscultation bilaterally, no wheezing, rhonchi or rales  Abd: soft, nontender Ext: no edema Musculoskeletal:  No deformities Skin: warm and dry  Neuro: , no focal abnormalities noted Psych:  Normal affect   EKG:  The EKG was personally reviewed and demonstrates:  sinus rhythm, rate 86, subtle diffuse concave ST elevations, appears unchanged from prior EKG in 2020 Telemetry:  Telemetry was personally reviewed and demonstrates:  NSR  Relevant CV Studies:   Laboratory Data:  High Sensitivity Troponin:   Recent Labs  Lab 10/05/21 2305 10/06/21 0319 10/06/21 0638  TROPONINIHS 259* 869* 806*     Chemistry Recent Labs  Lab 10/05/21 2305 10/05/21 2325 10/06/21 0636  NA 139 140 140  K 4.4 5.2* 4.9  CL 110 110 119*  CO2 20*  --  14*  GLUCOSE 190* 186* 145*  BUN 20 29* 20  CREATININE 2.01* 2.00* 1.75*  CALCIUM 8.8*  --  8.2*  MG 1.9  --  1.9  GFRNONAA 34*  --  40*  ANIONGAP 9  --  7     Recent Labs  Lab 10/05/21 2305 10/06/21 0636  PROT 6.1* 5.3*  ALBUMIN 3.3* 2.8*  AST 28 32  ALT 21 23  ALKPHOS 72 71  BILITOT 0.8 0.5   Lipids  Recent Labs  Lab 10/06/21 0637  CHOL 163  TRIG 21  HDL 34*  LDLCALC 125*  CHOLHDL 4.8    Hematology Recent Labs  Lab 10/05/21 2305 10/05/21 2325 10/06/21 0636  WBC 11.0*  --  8.4  RBC 4.13*  --  3.77*  HGB 13.0 13.3 11.8*  HCT 40.2 39.0 36.5*  MCV 97.3  --  96.8  MCH 31.5  --  31.3  MCHC 32.3  --  32.3  RDW 13.3  --  13.5  PLT 113*  --  96*   Thyroid  Recent Labs  Lab 10/06/21 0637  TSH 0.601    BNP Recent Labs  Lab 10/06/21 0638  BNP 71.7    DDimer  Recent Labs  Lab 10/05/21 2305  DDIMER >20.00*     Radiology/Studies:  DG Abd 1 View  Result Date: 10/06/2021 CLINICAL DATA:  Diarrhea. EXAM: ABDOMEN - 1 VIEW COMPARISON:  February 22, 2012 FINDINGS: A paucity of bowel gas is noted throughout the abdomen with a small amount of aerated bowel seen within the mid to upper right abdomen and left lower quadrant. No radio-opaque calculi or other significant radiographic abnormality are seen. 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 IMPRESSION: Paucity of bowel gas throughout the abdomen without evidence of bowel obstruction. Electronically Signed   By: Virgina Norfolk M.D.   On: 10/06/2021 04:10   DG Chest Port 1 View  Result Date: 10/05/2021 CLINICAL DATA:  CP, hypotension EXAM: PORTABLE CHEST 1 VIEW COMPARISON:  None Available. FINDINGS: The heart and mediastinal contours are within normal limits. Aortic calcification. Low lung volumes and elevated left hemidiaphragm. Left base atelectasis. No focal consolidation. No pulmonary edema. No pleural effusion. No pneumothorax. No acute osseous abnormality. IMPRESSION: 1. Low lung volumes with no active disease. 2.  Aortic Atherosclerosis (ICD10-I70.0). Electronically Signed   By: Iven Finn M.D.    On: 10/05/2021 23:22     Assessment and Plan:   Shock: Hypotensive on arrival, required low-dose Levophed.  Lactate 3.5 > 2.8.  Improved with IV fluids, suggesting less likely cardiogenic shock.  Troponin 259 > 869 > 806; while elevated, would have expected higher troponin if the cause of shock was from an MI, and could represent demand ischemia.  No ischemic changes on EKG.  PCT less than 0.1.  Hypovolemia from recent diarrhea could be contributing.  D-dimer is markedly elevated, and considering his history of DVT and recent travel, concerned about a PE -Stat echo obtained, on my review his LV systolic function is preserved but RV function is severely reduced.  There is normal function at the RV apex but free wall akinesis, consistent with McConnell's sign as can be seen in acute PE -Continue heparin gtt -Creatinine has improved with fluids, 1.75 this morning, close to baseline (1.4-1.7).  Will obtain CTPA -BP improved, has JVD on exam, would be cautious with aggressive IV fluids  NSTEMI: Troponin up to 869.  Did report chest pain.  Currently chest pain-free -Echo shows preserved LV systolic function with severe RV dysfunction as above, PE suspected.  Obtaining CTPA -Continue heparin drip  CRITICAL CARE TIME: I have spent a total of 45 minutes with patient reviewing hospital notes, telemetry, EKGs, labs and examining the patient as  well as establishing an assessment and plan that was discussed with the patient.  > 50% of time was spent in direct patient care. The patient is critically ill with multi-organ system failure and requires high complexity decision making for assessment and support, frequent evaluation and titration of therapies, application of advanced monitoring technologies and extensive interpretation of multiple databases.  For questions or updates, please contact [place] Please consult [email or web address] for contact info under    Signed, Donato Heinz, MD  10/06/2021  8:53 AM

## 2021-10-06 NOTE — Consult Note (Signed)
NAME:  Douglas Hoffman, MRN:  [id number], DOB:  Aug 12, 1945, LOS: 0 ADMISSION DATE:  10/05/2021, CONSULTATION DATE:  6/5 REFERRING MD:  Dr. Ralene Bathe, CHIEF COMPLAINT:  Shock   History of Present Illness:  Patient is a 76 year old male with pertinent PMH of HTN, gout, Hx of DVT 2015 no longer on coumadin since 2020 presents to [facility] ED on 6/5 with weakness/chest pain.  Patient states around 8:30 PM on 6/4 he was sitting/watching TV and started to feel weak.  He developed diaphoresis, central chest pain and had an episode of bowel incontinence.  EMS called.  Noted patient's BP to initially be 78/40.  Patient given IV fluids and transported to [facility] ED.  Upon arrival to [facility] ED patient's chest pain had resolved.  Patient states he did take his BP meds this morning.  Patient initial BP on arrival 87/69.  Placed on 2 LNC for support.  Started on low-dose levo and continuous IV fluids.  EKG without ST elevation.  Initial troponin 259.  Pertinent ED labs: Glucose 190, creatinine 2.01, WBC 11, LA 3.5.  Patient started on heparin for NSTEMI.  PCCM consulted for ICU admission.  Pertinent  Medical History   Past Medical History:  Diagnosis Date   Acute venous embolism and thrombosis of unspecified deep vessels of lower extremity    Arthritis    knee    Cancer (HCC)    Clotting disorder (HCC)    DVT many years  ago    Glaucoma    left eye since age 19   Gout    Hip pain    History of radiation therapy    Prostate   Hypertension    Prostate cancer [facility])    Renal disorder    reports prior to joint replacement surgery , was taking 5 to 6 aleve daily for pain releif, report shas stopped this and kidney function has omporved, kidney fx followed by his PCP        Significant Hospital Events: Including procedures, antibiotic start and stop dates in addition to other pertinent events   6/5: admitted to [facility] initially with chest pain; shock started on levo  Interim History / Subjective:  See H&P  Objective    Blood pressure 99/78, pulse 82, temperature (!) 97.3 F (36.3 C), temperature source Axillary, resp. rate 20, height 6' (1.829 m), weight 89.8 kg, SpO2 93 %.       No intake or output data in the 24 hours ending 10/06/21 0247 Filed Weights   10/05/21 2302  Weight: 89.8 kg    Examination: General:  NAD on [place] HEENT: MM pink/moist; [place] in place Neuro: Aox3; MAE CV: s1s2, RRR, no m/r/g; no pain with palpation PULM:  dim clear BS bilaterally; [place] 2L GI: soft, bsx4 active; no abdominal pain Extremities: warm/dry, no edema  Skin: no rashes or lesions appreciated   Resolved Hospital Problem list     Assessment & Plan:  Shock: septic?; mild leukocytosis P: -Continue IV fluids for MAP goal greater than 65 -Weaned off levo -Check PCT; consider ABX if elevated -Follow cultures: BC x2, UA/UC -trend troponin and lactic acid -check echo -Check D-dimer; consider CTA chest if elevated -Check mag  NSTEMI P: -telemetry monitoring -heparin gtt -trend troponin -consider cards consult per primary -Echo  AKI Hyperglycemia Hx of HTN Hx of gout P: -Per primary  Best Practice (right click and "Reselect all SmartList Selections" daily)  Per primary Labs   CBC: Recent Labs  Lab 10/05/21 2305 10/05/21  2325  WBC 11.0*  --   NEUTROABS 8.3*  --   HGB 13.0 13.3  HCT 40.2 39.0  MCV 97.3  --   PLT 113*  --     Basic Metabolic Panel: Recent Labs  Lab 10/05/21 2305 10/05/21 2325  NA 139 140  K 4.4 5.2*  CL 110 110  CO2 20*  --   GLUCOSE 190* 186*  BUN 20 29*  CREATININE 2.01* 2.00*  CALCIUM 8.8*  --    GFR: Estimated Creatinine Clearance: 34.5 mL/min (A) (by C-G formula based on SCr of 2 mg/dL (H)). Recent Labs  Lab 10/05/21 2305 10/05/21 2353  WBC 11.0*  --   LATICACIDVEN  --  3.5*    Liver Function Tests: Recent Labs  Lab 10/05/21 2305  AST 28  ALT 21  ALKPHOS 72  BILITOT 0.8  PROT 6.1*  ALBUMIN 3.3*   No results for input(s): LIPASE, AMYLASE in the  last 168 hours. No results for input(s): AMMONIA in the last 168 hours.  ABG    Component Value Date/Time   TCO2 24 10/05/2021 2325     Coagulation Profile: Recent Labs  Lab 10/05/21 2305  INR 1.2    Cardiac Enzymes: No results for input(s): CKTOTAL, CKMB, CKMBINDEX, TROPONINI in the last 168 hours.  HbA1C: No results found for: HGBA1C  CBG: No results for input(s): GLUCAP in the last 168 hours.  Review of Systems:   Review of Systems  Constitutional:  Positive for diaphoresis and malaise/fatigue. Negative for fever.  Respiratory:  Negative for cough, sputum production and shortness of breath.   Cardiovascular:  Positive for chest pain. Negative for leg swelling.  Gastrointestinal:  Positive for diarrhea and nausea. Negative for abdominal pain, constipation, heartburn and vomiting.  Neurological:  Positive for weakness.    Past Medical History:  He,  has a past medical history of Acute venous embolism and thrombosis of unspecified deep vessels of lower extremity, Arthritis, Cancer ([place]), Clotting disorder ([place]), Glaucoma, Gout, Hip pain, History of radiation therapy, Hypertension, Prostate cancer ([place]), and Renal disorder.   Surgical History:   Past Surgical History:  Procedure Laterality Date   COLONOSCOPY  08/14/2010   normal    JOINT REPLACEMENT     RIGHT Hip Replacement   JOINT REPLACEMENT     Knee Replacement   SUPRAPUBIC PROSTATECTOMY     TOTAL KNEE REVISION Left 07/07/2018   Procedure: TOTAL KNEE REVISION LEFT KNEE ARTHROPLASTY WITH FEMORAL AND TIBIAL COMPONENTS;  Surgeon: Rod Can, MD;  Location: [facility];  Service: Orthopedics;  Laterality: Left;     Social History:   reports that he has never smoked. He has never used smokeless tobacco. He reports that he does not drink alcohol and does not use drugs.   Family History:  His family history is negative for Colon cancer, Colon polyps, Esophageal cancer, Rectal cancer, and Stomach cancer.    Allergies No Known Allergies   Home Medications  Prior to Admission medications   Medication Sig Start Date End Date Taking? Authorizing Provider  acetaminophen (TYLENOL 8 HOUR) 650 MG CR tablet Take 1,300 mg by mouth every 8 (eight) hours as needed.     [provider]  acetaminophen (TYLENOL) 325 MG tablet Take 650 mg by mouth every 6 (six) hours as needed.     [provider]  allopurinol (ZYLOPRIM) 100 MG tablet Take 100 mg by mouth daily.    [provider]  amLODipine (NORVASC) 5 MG tablet Take 5  mg by mouth daily.    [provider]  apixaban (ELIQUIS) 2.5 MG TABS tablet Take 1 tablet (2.5 mg total) by mouth 2 (two) times daily. 07/08/18   Swinteck, Aaron Edelman, MD  aspirin 81 MG chewable tablet Chew 81 mg by mouth daily.    [provider]  cholecalciferol (VITAMIN D) 1000 UNITS tablet Take 1,000 Units by mouth daily.    [provider]  docusate sodium (COLACE) 100 MG capsule Take 1 capsule (100 mg total) by mouth 2 (two) times daily. Patient not taking: Reported on 06/06/2019 07/08/18   Rod Can, MD  HYDROcodone-acetaminophen (NORCO/VICODIN) 5-325 MG tablet Take 1 tablet by mouth every 4 (four) hours as needed for moderate pain (pain score 4-6). Patient not taking: Reported on 06/06/2019 07/08/18   Rod Can, MD  lisinopril (PRINIVIL,ZESTRIL) 20 MG tablet Take 20 mg by mouth daily.    [provider]  Multiple Vitamin (MULTIVITAMIN) tablet Take 1 tablet by mouth daily. Centrum daily    [provider]  ondansetron (ZOFRAN) 4 MG tablet Take 1 tablet (4 mg total) by mouth every 6 (six) hours as needed for nausea. Patient not taking: Reported on 06/06/2019 07/08/18   Rod Can, MD  senna (SENOKOT) 8.6 MG TABS tablet Take 2 tablets (17.2 mg total) by mouth at bedtime. Patient not taking: Reported on 06/06/2019 07/08/18   Rod Can, MD          JD Rexene Agent [facility] 10/06/2021,  2:47 AM  Please see [email or web address] for pager details.  From 7A-7P if no response, please call [phone]. After hours, please call ELink [phone].

## 2021-10-06 NOTE — H&P (Signed)
History and Physical  Douglas Hoffman AOZ:[id number] DOB: May 05, 1945 DOA: 10/05/2021  Referring physician: Dr. Ralene Bathe, [place]  PCP: [name], MD  Outpatient Specialists: None Patient coming from: Home  Chief Complaint: Chest discomfort   HPI: Douglas Hoffman is a 76 y.o. male with medical history significant for hypertension, gout, DVT in 2015, off anticoagulation, who presented to [facility] ED via EMS from home due to sudden onset chest discomfort associated with generalized weakness and diaphoresis.  The patient was sitting down watching TV when all of a sudden he started feeling discomfort in the middle of his chest, nonradiating with profuse diaphoresis.  His wife activated EMS.  Upon EMS arrival the patient was hypotensive and was given IV fluid in route.    In the ED, the patient received a full dose aspirin 324 mg x 1, was started on low-dose vasopressor Levophed and IV fluid maintenance.  PCCM was consulted due to hypotension.  Initial high-sensitivity troponin was elevated 259.  No ST-T elevation on twelve-lead EKG.  The patient was started on heparin drip for NSTEMI in the ED.  At time of this visit the patient has minimal chest pain, is lethargic but answers questions appropriately.  TRH, hospitalist service, was asked to admit.  ED Course: Temperature 97.3.  BP 109/88, pulse 75, respiratory 18, saturation 98% on 2 L.  Lab studies remarkable for serum potassium 5.2, glucose 186, BUN 29, creatinine 2.0.  High-sensitivity troponin 259, repeat 869.  Lactic acid 3.5, repeat 2.8.  WBC 11.0.  Hemoglobin 13.0.  Platelet count 113.  Review of Systems: Review of systems as noted in the HPI. All other systems reviewed and are negative.   Past Medical History:  Diagnosis Date   Acute venous embolism and thrombosis of unspecified deep vessels of lower extremity    Arthritis    knee    Cancer (HCC)    Clotting disorder (HCC)    DVT many years  ago    Glaucoma    left eye since age 79   Gout    Hip  pain    History of radiation therapy    Prostate   Hypertension    Prostate cancer [facility])    Renal disorder    reports prior to joint replacement surgery , was taking 5 to 6 aleve daily for pain releif, report shas stopped this and kidney function has omporved, kidney fx followed by his PCP      Past Surgical History:  Procedure Laterality Date   COLONOSCOPY  08/14/2010   normal    JOINT REPLACEMENT     RIGHT Hip Replacement   JOINT REPLACEMENT     Knee Replacement   SUPRAPUBIC PROSTATECTOMY     TOTAL KNEE REVISION Left 07/07/2018   Procedure: TOTAL KNEE REVISION LEFT KNEE ARTHROPLASTY WITH FEMORAL AND TIBIAL COMPONENTS;  Surgeon: Rod Can, MD;  Location: [facility] ORS;  Service: Orthopedics;  Laterality: Left;    Social History:  reports that he has never smoked. He has never used smokeless tobacco. He reports that he does not drink alcohol and does not use drugs.   No Known Allergies  Family History  Problem Relation Age of Onset   Colon cancer Neg Hx    Colon polyps Neg Hx    Esophageal cancer Neg Hx    Rectal cancer Neg Hx    Stomach cancer Neg Hx       Prior to Admission medications   Medication Sig Start Date End Date Taking? Authorizing Provider  lisinopril (  PRINIVIL,ZESTRIL) 20 MG tablet Take 20 mg by mouth daily.   Yes [provider]  acetaminophen (TYLENOL 8 HOUR) 650 MG CR tablet Take 1,300 mg by mouth every 8 (eight) hours as needed.     [provider]  acetaminophen (TYLENOL) 325 MG tablet Take 650 mg by mouth every 6 (six) hours as needed.     [provider]  allopurinol (ZYLOPRIM) 100 MG tablet Take 100 mg by mouth daily.    [provider]  amLODipine (NORVASC) 5 MG tablet Take 5 mg by mouth daily.    [provider]  apixaban (ELIQUIS) 2.5 MG TABS tablet Take 1 tablet (2.5 mg total) by mouth 2 (two) times daily. 07/08/18   Swinteck, Aaron Edelman, MD  aspirin 81 MG chewable tablet Chew 81 mg by mouth daily.    [provider]  cholecalciferol (VITAMIN D) 1000 UNITS tablet Take 1,000 Units by mouth daily.    [provider]  docusate sodium (COLACE) 100 MG capsule Take 1 capsule (100 mg total) by mouth 2 (two) times daily. Patient not taking: Reported on 06/06/2019 07/08/18   Rod Can, MD  HYDROcodone-acetaminophen (NORCO/VICODIN) 5-325 MG tablet Take 1 tablet by mouth every 4 (four) hours as needed for moderate pain (pain score 4-6). Patient not taking: Reported on 06/06/2019 07/08/18   Rod Can, MD  Multiple Vitamin (MULTIVITAMIN) tablet Take 1 tablet by mouth daily. Centrum daily    [provider]  ondansetron (ZOFRAN) 4 MG tablet Take 1 tablet (4 mg total) by mouth every 6 (six) hours as needed for nausea. Patient not taking: Reported on 06/06/2019 07/08/18   Rod Can, MD  senna (SENOKOT) 8.6 MG TABS tablet Take 2 tablets (17.2 mg total) by mouth at bedtime. Patient not taking: Reported on 06/06/2019 07/08/18   Rod Can, MD    Physical Exam: BP 110/85   Pulse 76   Temp (!) 97.3 F (36.3 C) (Axillary)   Resp 20   Ht 6' (1.829 m)   Wt 89.8 kg   SpO2 97%   BMI 26.85 kg/m   General: 75 y.o. year-old male well developed well nourished in no acute distress.  Lethargic and oriented x3, answers questions appropriately. Cardiovascular: Regular rate and rhythm with no rubs or gallops.  No thyromegaly or JVD noted.  Trace lower extremity edema. 2/4 pulses in all 4 extremities. Respiratory: Clear to auscultation with no wheezes or rales. Good inspiratory effort. Abdomen: Soft nontender nondistended with normal bowel sounds x4 quadrants. Muskuloskeletal: No cyanosis or clubbing.  Trace edema noted bilaterally Neuro: CN II-XII intact, strength, sensation, reflexes Skin: No ulcerative lesions noted or rashes Psychiatry: Judgement and insight appear normal. Mood is appropriate for condition and setting          Labs on Admission:  Basic Metabolic Panel: Recent Labs   Lab 10/05/21 2305 10/05/21 2325  NA 139 140  K 4.4 5.2*  CL 110 110  CO2 20*  --   GLUCOSE 190* 186*  BUN 20 29*  CREATININE 2.01* 2.00*  CALCIUM 8.8*  --   MG 1.9  --    Liver Function Tests: Recent Labs  Lab 10/05/21 2305  AST 28  ALT 21  ALKPHOS 72  BILITOT 0.8  PROT 6.1*  ALBUMIN 3.3*   No results for input(s): LIPASE, AMYLASE in the last 168 hours. No results for input(s): AMMONIA in the last 168 hours. CBC: Recent Labs  Lab 10/05/21 2305 10/05/21 2325  WBC 11.0*  --  NEUTROABS 8.3*  --   HGB 13.0 13.3  HCT 40.2 39.0  MCV 97.3  --   PLT 113*  --    Cardiac Enzymes: No results for input(s): CKTOTAL, CKMB, CKMBINDEX, TROPONINI in the last 168 hours.  BNP (last 3 results) No results for input(s): BNP in the last 8760 hours.  ProBNP (last 3 results) No results for input(s): PROBNP in the last 8760 hours.  CBG: No results for input(s): GLUCAP in the last 168 hours.  Radiological Exams on Admission: DG Abd 1 View  Result Date: 10/06/2021 CLINICAL DATA:  Diarrhea. EXAM: ABDOMEN - 1 VIEW COMPARISON:  February 22, 2012 FINDINGS: A paucity of bowel gas is noted throughout the abdomen with a small amount of aerated bowel seen within the mid to upper right abdomen and left lower quadrant. No radio-opaque calculi or other significant radiographic abnormality are seen. 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 IMPRESSION: Paucity of bowel gas throughout the abdomen without evidence of bowel obstruction. Electronically Signed   By: Virgina Norfolk M.D.   On: 10/06/2021 04:10   DG Chest Port 1 View  Result Date: 10/05/2021 CLINICAL DATA:  CP, hypotension EXAM: PORTABLE CHEST 1 VIEW COMPARISON:  None Available. FINDINGS: The heart and mediastinal contours are within normal limits. Aortic calcification. Low lung volumes and elevated left hemidiaphragm. Left base atelectasis. No focal consolidation. No  pulmonary edema. No pleural effusion. No pneumothorax. No acute osseous abnormality. IMPRESSION: 1. Low lung volumes with no active disease. 2.  Aortic Atherosclerosis (ICD10-I70.0). Electronically Signed   By: Iven Finn M.D.   On: 10/05/2021 23:22    EKG: I independently viewed the EKG done and my findings are as followed: Sinus rhythm rate of 84.  Nonspecific ST-T changes.  QTc 464.  Assessment/Plan Present on Admission:  NSTEMI (non-ST elevated myocardial infarction) ([place])  Principal Problem:   NSTEMI (non-ST elevated myocardial infarction) ([place])  NSTEMI Presented with mild chest discomfort and diaphoresis Initial high-sensitivity troponin 259, repeat troponin 869 Received a full dose aspirin 324 mg x 1 in the ED Started on heparin drip in the ED, continue. Serial twelve-lead EKG Complete 2D echo. Fasting lipid panel, A1c, ordered Daily aspirin 81 mg, daily Lipitor 80 mg ordered Cardiology consulted to assist with the management.  Concern for cardiogenic shock Hypotensive initially, was started on Levophed in the ED, now vasopressor on hold. On IV fluid maintenance, NS at 150 cc/h, obtain BNP. Follow 2D echo Hold off home oral antihypertensives Maintain MAP greater than 65. Cardiology consulted, defer further management to cardiology.  Essential hypertension, hypotensive Initially requiring vasopressor, Levophed, now on hold. Maintain MAP greater than 65 Seen by PCCM in consultation due to hypotension.  AKI on CKD 3B suspect contributed by hypotension Baseline creatinine appears to be 1.6 with GFR of 48. Presented with creatinine of 2.0 IV fluid hydration Monitor urine output with strict I's and O's Avoid nephrotoxic agents and hypotension. Repeat renal panel in the morning.  Mild non-anion gap metabolic acidosis, secondary to acute renal insufficiency Anion gap 9, serum bicarb 20 IV fluid hydration Repeat chemistry panel in the morning  Hyperkalemia in the  setting of acute renal insufficiency Serum potassium 5.2 Lokelma 10 g x 1 Serum magnesium 1.9.  Type 2 diabetes with hyperglycemia Obtain hemoglobin A1c Keep n.p.o. until seen by cardiology Start insulin sliding scale every 4 hours  Mild leukocytosis WBC 11K Afebrile No evidence of pulmonary infiltrates or pneumonia  on chest x-ray Obtain UA  Acute thrombocytopenia Platelet count 113K No overt bleeding Continue to monitor  Generalized weakness Sudden onset of generalized weakness around 8:30 PM-9 PM on 10/05/2021 Nonfocal Once cleared by cardiology, consult PT OT for assessment. Fall precautions    Critical care time: 65 minutes.    DVT prophylaxis: Heparin drip  Code Status: Full code  Family Communication: None at bedside  Disposition Plan: Admitted to progressive unit  Consults called: Cardiology  Admission status: Inpatient status   Status is: Inpatient The patient requires at least 2 midnights for further evaluation and treatment of present condition.   Kayleen Memos MD [facility] Pager [phone]  If 7PM-7AM, please contact night-coverage [email or web address] Password [facility]  10/06/2021, 4:35 AM

## 2021-10-06 NOTE — Procedures (Signed)
  Procedure:  Initiation of Bilat pulm artery catheter directed thrombolysis pulm art pressure 42/10 (22)mmHg Preprocedure diagnosis: The primary encounter diagnosis was Shock ([place]). A diagnosis of AKI (acute kidney injury) ([place]) was also pertinent to this visit.  Postprocedure diagnosis: same EBL:    minimal Complications:   none immediate  See full dictation in [employer].  Dillard Cannon MD Main # [phone] Pager  [phone] Mobile [phone]

## 2021-10-06 NOTE — ED Notes (Signed)
Checked patient cbg it was 39 notified RN of blood sugar patient is resting with call bell in reach and family at bedside

## 2021-10-06 NOTE — ED Notes (Signed)
Patient transported to CT 

## 2021-10-06 NOTE — Progress Notes (Signed)
ANTICOAGULATION CONSULT NOTE  Pharmacy Consult for Heparin Indication: chest pain/ACS vs rule out PE  No Known Allergies  Patient Measurements: Height: 6' (182.9 cm) Weight: 89.8 kg (198 lb) IBW/kg (Calculated) : 77.6  Vital Signs: Temp: 97.6 F (36.4 C) (06/05 0811) Temp Source: Oral (06/05 0811) BP: 96/76 (06/05 1100) Pulse Rate: 66 (06/05 1100)  Labs: Recent Labs    10/05/21 2305 10/05/21 2325 10/06/21 0319 10/06/21 0636 10/06/21 0638 10/06/21 1001  HGB 13.0 13.3  --  11.8*  --   --   HCT 40.2 39.0  --  36.5*  --   --   PLT 113*  --   --  96*  --   --   LABPROT 15.1  --   --   --   --   --   INR 1.2  --   --   --   --   --   HEPARINUNFRC  --   --   --   --   --  0.46  CREATININE 2.01* 2.00*  --  1.75*  --   --   TROPONINIHS 259*  --  869*  --  806*  --      Estimated Creatinine Clearance: 39.4 mL/min (A) (by C-G formula based on SCr of 1.75 mg/dL (H)).  Assessment: 76 y.o. male with hypotension/weakness and elevated troponin and d-dimer.  Pharmacy consulted to dose IV heparin for ACS vs PE.  Heparin level therapeutic; no bleeding reported.  H/H stable, plts low at 96K (range 110-150s since 2020).  Goal of Therapy:  Heparin level 0.3-0.7 units/ml Monitor platelets by anticoagulation protocol: Yes   Plan:  Continue heparin infusion at 1100 units/hr Daily heparin level and CBC (plts) F/u CTA result  Srinidhi Landers D. Mina Marble, PharmD, BCPS, [place] 10/06/2021, 11:27 AM

## 2021-10-06 NOTE — ED Notes (Signed)
Family at bedside, updated on POC. Pt denies any needs at this time, no acute distress noted.

## 2021-10-06 NOTE — Sedation Documentation (Signed)
Handoff with 4N RNs at bedside. Patient remains stable, awake, and alert.

## 2021-10-06 NOTE — Plan of Care (Signed)

## 2021-10-06 NOTE — Consult Note (Signed)
Cardiology Consultation:   Patient ID: Sydney Azure MRN: [id number]; DOB: August 15, 1945  Admit date: 10/05/2021 Date of Consult: 10/06/2021  Primary Care Provider: [name], MD [facility] Cardiologist: None  [facility] Electrophysiologist:  None   Patient Profile:   Chee Kinslow is a 76 y.o. male with HTN,  who is being seen today for the evaluation of NSTEMI at the request of Dr. Irene Pap (hospitalist).  History of Present Illness:   Mr. Barocio was brought in by EMS after experiencing chest discomfort, diaphoresis, fatigue and generalized weakness while sitting at home on the couch watching TV with his wife.  He had 1 episode of diarrhea and on EMS arrival was lethargic, diaphoretic, and cool with hypotension (BP 78/40).  He denied chest pain or any ongoing symptoms on arrival to the emergency department.  He also reported dyspnea on exertion and has been placed on 2 L nasal cannula by EMS and given a 1 L normal saline bolus in route with improvement in his blood pressure to 102/70.  His only active medical history is hypertension for which he takes lisinopril 20 mg daily and amlodipine 5 mg daily but takes them intermittently and sometimes goes a day or two without taking them. There was some chart review discussing that he may have taken lisinopril 20 mg x2 tablets however he was saying this is incorrect, he takes lisinopril 20 mg daily and amlodipine 5 mg qhs. He denied tobacco use, EtOH use, or reactional drug use.  VS on ED arrival: P 91. BP 87/69, RR 16, O2 97%/RA  Initially he was reportedly in acute distress and generally ill-appearing.  The patient's wife and said he had not been looking well over the past few weeks and also had an unwitnessed syncopal episode 3 weeks prior.  He has been started on low-dose Levophed with an elevated lactic acid which was subsequently weaned off.  He was started on heparin for ACS with an elevated troponin ([id number]).   Lab work notable  for AKI on CKD (sCr 2.01 from bl 1.4-1.6), mild leukocytosis (WBC 11k), chronic thrombocytopenia (plt 113k). Bcx were obtained.   During my evaluation he is here with his wife who is at bedside. He appears  Wife says he was "sweating like someone threw a bucket of water on him." This was ~20:00 (10/05/21) but Mr. Macleod didn't want his wife to call EMS. He felt "strange" but couldn't describe what it was. Then around 1 hour later he said he didn't feel well and tried to go to the rest room. He thought he had urinated on himself. Then by the time wife got to the restroom he had uncontrolled bowel movement over the bathroom. Then diaphoretic and went "limp" in the bathroom and although he didn't have complete LOC he was very out of it. He was able to nod his head but kept getting weaker, no FNDs.   VS during my evaluation: P 82, BP 93/64 (74), O2 96%/2 L [place], RR 22.   He reports "slight discomfort" of his central chest when everything started feeling "off" with 6/10 severity but didn't feel it was that bad. No prior CAD history. Non smoker.   Past Medical History:  Diagnosis Date   Acute venous embolism and thrombosis of unspecified deep vessels of lower extremity    Arthritis    knee    Cancer (HCC)    Clotting disorder (HCC)    DVT many years  ago    Glaucoma  left eye since age 30   Gout    Hip pain    History of radiation therapy    Prostate   Hypertension    Prostate cancer [facility])    Renal disorder    reports prior to joint replacement surgery , was taking 5 to 6 aleve daily for pain releif, report shas stopped this and kidney function has omporved, kidney fx followed by his PCP      Past Surgical History:  Procedure Laterality Date   COLONOSCOPY  08/14/2010   normal    JOINT REPLACEMENT     RIGHT Hip Replacement   JOINT REPLACEMENT     Knee Replacement   SUPRAPUBIC PROSTATECTOMY     TOTAL KNEE REVISION Left 07/07/2018   Procedure: TOTAL KNEE REVISION LEFT KNEE ARTHROPLASTY  WITH FEMORAL AND TIBIAL COMPONENTS;  Surgeon: Rod Can, MD;  Location: [facility];  Service: Orthopedics;  Laterality: Left;    Home Medications:  Prior to Admission medications   Medication Sig Start Date End Date Taking? Authorizing Provider  lisinopril (PRINIVIL,ZESTRIL) 20 MG tablet Take 20 mg by mouth daily.   Yes [provider]  acetaminophen (TYLENOL 8 HOUR) 650 MG CR tablet Take 1,300 mg by mouth every 8 (eight) hours as needed.     [provider]  acetaminophen (TYLENOL) 325 MG tablet Take 650 mg by mouth every 6 (six) hours as needed.     [provider]  allopurinol (ZYLOPRIM) 100 MG tablet Take 100 mg by mouth daily.    [provider]  amLODipine (NORVASC) 5 MG tablet Take 5 mg by mouth daily.    [provider]  apixaban (ELIQUIS) 2.5 MG TABS tablet Take 1 tablet (2.5 mg total) by mouth 2 (two) times daily. 07/08/18   Swinteck, Aaron Edelman, MD  aspirin 81 MG chewable tablet Chew 81 mg by mouth daily.    [provider]  cholecalciferol (VITAMIN D) 1000 UNITS tablet Take 1,000 Units by mouth daily.    [provider]  docusate sodium (COLACE) 100 MG capsule Take 1 capsule (100 mg total) by mouth 2 (two) times daily. Patient not taking: Reported on 06/06/2019 07/08/18   Rod Can, MD  HYDROcodone-acetaminophen (NORCO/VICODIN) 5-325 MG tablet Take 1 tablet by mouth every 4 (four) hours as needed for moderate pain (pain score 4-6). Patient not taking: Reported on 06/06/2019 07/08/18   Rod Can, MD  Multiple Vitamin (MULTIVITAMIN) tablet Take 1 tablet by mouth daily. Centrum daily    [provider]  ondansetron (ZOFRAN) 4 MG tablet Take 1 tablet (4 mg total) by mouth every 6 (six) hours as needed for nausea. Patient not taking: Reported on 06/06/2019 07/08/18   Rod Can, MD  senna (SENOKOT) 8.6 MG TABS tablet Take 2 tablets (17.2 mg total) by mouth at bedtime. Patient not taking: Reported on 06/06/2019 07/08/18    Rod Can, MD   Inpatient Medications: Scheduled Meds:  aspirin EC  81 mg Oral Daily   atorvastatin  80 mg Oral Daily   insulin aspart  0-9 Units Subcutaneous Q4H   sodium zirconium cyclosilicate  10 g Oral Once   Continuous Infusions:  sodium chloride 150 mL/hr at 10/06/21 0813   heparin 1,100 Units/hr (10/06/21 0023)   norepinephrine (LEVOPHED) Adult infusion Stopped (10/06/21 0329)   PRN Meds:  Allergies:   No Known Allergies  Social History:   Social History   Socioeconomic History   Marital status: Married    Spouse name: Not on file  Number of children: Not on file   Years of education: Not on file   Highest education level: Not on file  Occupational History   Not on file  Tobacco Use   Smoking status: Never   Smokeless tobacco: Never  Vaping Use   Vaping Use: Never used  Substance and Sexual Activity   Alcohol use: No   Drug use: No   Sexual activity: Not on file  Other Topics Concern   Not on file  Social History Narrative   Not on file   Social Determinants of Health   Financial Resource Strain: Not on file  Food Insecurity: Not on file  Transportation Needs: Not on file  Physical Activity: Not on file  Stress: Not on file  Social Connections: Not on file  Intimate Partner Violence: Not on file    Family History:    Family History  Problem Relation Age of Onset   Colon cancer Neg Hx    Colon polyps Neg Hx    Esophageal cancer Neg Hx    Rectal cancer Neg Hx    Stomach cancer Neg Hx     ROS:  Review of Systems: [y] = yes, '[ ]'$  = no      General: Weight gain '[ ]'$ ; Weight loss '[ ]'$ ; Anorexia '[ ]'$ ; Fatigue '[ ]'$ ; Fever '[ ]'$ ; Chills '[ ]'$ ; Weakness '[ ]'$    Cardiac: Chest pain/pressure [y] Resting SOB '[ ]'$ ; Exertional SOB '[ ]'$ ; Orthopnea '[ ]'$ ; Pedal Edema '[ ]'$ ; Palpitations '[ ]'$ ; Syncope '[ ]'$ ; Presyncope '[ ]'$ ; Paroxysmal nocturnal dyspnea '[ ]'$    Pulmonary: Cough '[ ]'$ ; Wheezing '[ ]'$ ; Hemoptysis '[ ]'$ ; Sputum '[ ]'$ ; Snoring '[ ]'$    GI: Vomiting '[ ]'$ ; Dysphagia '[ ]'$ ;  Melena '[ ]'$ ; Hematochezia '[ ]'$ ; Heartburn '[ ]'$ ; Abdominal pain '[ ]'$ ; Constipation '[ ]'$ ; Diarrhea '[ ]'$ ; BRBPR '[ ]'$    GU: Hematuria '[ ]'$ ; Dysuria '[ ]'$ ; Nocturia '[ ]'$  Vascular: Pain in legs with walking '[ ]'$ ; Pain in feet with lying flat '[ ]'$ ; Non-healing sores '[ ]'$ ; Stroke '[ ]'$ ; TIA '[ ]'$ ; Slurred speech '[ ]'$ ;   Neuro: Headaches '[ ]'$ ; Vertigo '[ ]'$ ; Seizures '[ ]'$ ; Paresthesias '[ ]'$ ;Blurred vision '[ ]'$ ; Diplopia '[ ]'$ ; Vision changes '[ ]'$    Ortho/Skin: Arthritis '[ ]'$ ; Joint pain '[ ]'$ ; Muscle pain '[ ]'$ ; Joint swelling '[ ]'$ ; Back Pain '[ ]'$ ; Rash '[ ]'$    Psych: Depression '[ ]'$ ; Anxiety '[ ]'$    Heme: Bleeding problems '[ ]'$ ; Clotting disorders '[ ]'$ ; Anemia '[ ]'$    Endocrine: Diabetes '[ ]'$ ; Thyroid dysfunction '[ ]'$    Physical Exam/Data:   Vitals:   10/06/21 0810 10/06/21 0811 10/06/21 0815 10/06/21 0830  BP: 92/72  92/72 108/83  Pulse: 83  75 80  Resp: 17  16 (!) 24  Temp:  97.6 F (36.4 C)    TempSrc:  Oral    SpO2: 99%  97% 100%  Weight:      Height:        Intake/Output Summary (Last 24 hours) at 10/06/2021 0903 Last data filed at 10/06/2021 0256 Gross per 24 hour  Intake 33.9 ml  Output --  Net 33.9 ml      10/05/2021   11:02 PM 06/06/2019    7:09 AM 05/23/2019   12:47 PM  Last 3 Weights  Weight (lbs) 198 lb 204 lb 204 lb  Weight (kg) 89.812 kg 92.534 kg 92.534 kg     Body mass index is 26.85  kg/m.  General:  Well nourished, well developed, in no acute distress HEENT: normal Lymph: no adenopathy Neck: no JVD Endocrine:  No thryomegaly Vascular: No carotid bruits; FA pulses 2+ bilaterally without bruits  Cardiac:  normal S1, S2; RRR; no murmur  Lungs:  clear to auscultation bilaterally, no wheezing, rhonchi or rales  Abd: soft, nontender, no hepatomegaly  Ext: no edema Musculoskeletal:  No deformities, BUE and BLE strength normal and equal Skin: warm and dry  Neuro:  CNs 2-12 intact, no focal abnormalities noted Psych:  Normal affect   EKG:  The EKG was personally reviewed (10/06/21, 00:10:46) and demonstrates: NSR,  ventricular rate 84 bpm, PR 175 ms, QRS 105 ms, Qtc 464 ms, subtle ST elevations in lateral leads < 0.5 mm not meeting STEMI criteria, similar to prior comparison in 2020 which also had slight ST elevation in lateral leads.  Telemetry:  Telemetry was personally reviewed and demonstrates: NSR  Relevant CV Studies: None   Laboratory Data:  High Sensitivity Troponin:   Recent Labs  Lab 10/05/21 2305 10/06/21 0319 10/06/21 0638  TROPONINIHS 259* 869* 806*     Chemistry Recent Labs  Lab 10/05/21 2305 10/05/21 2325 10/06/21 0636  NA 139 140 140  K 4.4 5.2* 4.9  CL 110 110 119*  CO2 20*  --  14*  GLUCOSE 190* 186* 145*  BUN 20 29* 20  CREATININE 2.01* 2.00* 1.75*  CALCIUM 8.8*  --  8.2*  GFRNONAA 34*  --  40*  ANIONGAP 9  --  7    Recent Labs  Lab 10/05/21 2305 10/06/21 0636  PROT 6.1* 5.3*  ALBUMIN 3.3* 2.8*  AST 28 32  ALT 21 23  ALKPHOS 72 71  BILITOT 0.8 0.5   Hematology Recent Labs  Lab 10/05/21 2305 10/05/21 2325 10/06/21 0636  WBC 11.0*  --  8.4  RBC 4.13*  --  3.77*  HGB 13.0 13.3 11.8*  HCT 40.2 39.0 36.5*  MCV 97.3  --  96.8  MCH 31.5  --  31.3  MCHC 32.3  --  32.3  RDW 13.3  --  13.5  PLT 113*  --  96*   BNP Recent Labs  Lab 10/06/21 0638  BNP 71.7    DDimer  Recent Labs  Lab 10/05/21 2305  DDIMER >20.00*   Radiology/Studies:  DG Abd 1 View  Result Date: 10/06/2021 CLINICAL DATA:  Diarrhea. EXAM: ABDOMEN - 1 VIEW COMPARISON:  February 22, 2012 FINDINGS: A paucity of bowel gas is noted throughout the abdomen with a small amount of aerated bowel seen within the mid to upper right abdomen and left lower quadrant. No radio-opaque calculi or other significant radiographic abnormality are seen. 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 IMPRESSION: Paucity of bowel gas throughout the abdomen without evidence of bowel obstruction. Electronically Signed   By: Virgina Norfolk  M.D.   On: 10/06/2021 04:10   DG Chest Port 1 View  Result Date: 10/05/2021 CLINICAL DATA:  CP, hypotension EXAM: PORTABLE CHEST 1 VIEW COMPARISON:  None Available. FINDINGS: The heart and mediastinal contours are within normal limits. Aortic calcification. Low lung volumes and elevated left hemidiaphragm. Left base atelectasis. No focal consolidation. No pulmonary edema. No pleural effusion. No pneumothorax. No acute osseous abnormality. IMPRESSION: 1. Low lung volumes with no active disease. 2.  Aortic Atherosclerosis (ICD10-I70.0). Electronically Signed   By: Iven Finn M.D.   On: 10/05/2021 23:22  TIMI Risk Score for Unstable Angina or Non-ST Elevation MI:   The patient's TIMI risk score is 2, which indicates a 8% risk of all cause mortality, new or recurrent myocardial infarction or need for urgent revascularization in the next 14 days.{  Assessment and Plan:   Chest pain Dizziness/unsteadiness Patient with an interesting constellation of symptoms most prominent with mild chest discomfort followed by profuse diaphoresis subsequently he had gait unsteadiness, lightheadedness, and presyncopal event witnessed by his wife with loss of bowel and bladder continence but no seizure-like activity.  He described his chest discomfort as central and non-radiating, 6 out of 10 in severity.  He has never had any episodes like this before and is otherwise a very functional gentleman who still active and works.  Of note he did have a domestic argument with his wife yesterday which led to him significant mount of stress.  Initial lab work notable for elevated troponin and lactic acidosis.  He was given 1 L IV fluids which actually led to movement in his lactate.  Troponins continue to uptrend and then leveled out.  ECG notable for lateral subtle ST elevations however in comparison to prior these are similar to ECGs in 2020.  During my evaluation he had no ongoing chest discomfort and this was not a prominent  portion of his complaint.  Although he was not significantly hypoxic he was hypotensive breath on initial EMS evaluation prompting concern for possible PE.  Additionally despite IV fluids he seemed to improve with both his lactic acidosis trending in the right direction and his AKI on CKD improving with hydration. Blood pressure also improved with IV fluids.  Interestingly D-dimer returned greater than 20 and he did have a recent travel history. Bedside echo with least moderate LV dysfunction with apical akinesis and RV dysfunction as well with dilation. Differential includes primary ACS versus PE versus stress-induced cardiomyopathy.  With improvement in his creatinine we will plan for CT PE today and if this is negative will consider coronary angiography tomorrow if renal function is stable.  Fortunately he is chest pain-free and now off of oxygen and otherwise comfortable.  Formal echo this morning to evaluate for assessment of biventricular function.   For questions or updates, please contact [place] Please consult [email or web address] for contact info under   Signed, Dion Body, MD  10/06/2021 9:03 AM

## 2021-10-07 ENCOUNTER — Other Ambulatory Visit (HOSPITAL_COMMUNITY): Payer: Self-pay

## 2021-10-07 ENCOUNTER — Inpatient Hospital Stay (HOSPITAL_COMMUNITY): Payer: PPO

## 2021-10-07 DIAGNOSIS — I248 Other forms of acute ischemic heart disease: Secondary | ICD-10-CM | POA: Diagnosis not present

## 2021-10-07 DIAGNOSIS — R579 Shock, unspecified: Secondary | ICD-10-CM | POA: Diagnosis not present

## 2021-10-07 DIAGNOSIS — N179 Acute kidney failure, unspecified: Secondary | ICD-10-CM | POA: Diagnosis not present

## 2021-10-07 DIAGNOSIS — I214 Non-ST elevation (NSTEMI) myocardial infarction: Secondary | ICD-10-CM | POA: Diagnosis not present

## 2021-10-07 HISTORY — PX: IR THROMB F/U EVAL ART/VEN FINAL DAY (MS): IMG5379

## 2021-10-07 LAB — CBC
HCT: 28.3 % — ABNORMAL LOW (ref 39.0–52.0)
HCT: 28.8 % — ABNORMAL LOW (ref 39.0–52.0)
HCT: 32 % — ABNORMAL LOW (ref 39.0–52.0)
HCT: 34.7 % — ABNORMAL LOW (ref 39.0–52.0)
Hemoglobin: 10.4 g/dL — ABNORMAL LOW (ref 13.0–17.0)
Hemoglobin: 11.5 g/dL — ABNORMAL LOW (ref 13.0–17.0)
Hemoglobin: 9.5 g/dL — ABNORMAL LOW (ref 13.0–17.0)
Hemoglobin: 9.6 g/dL — ABNORMAL LOW (ref 13.0–17.0)
MCH: 31.3 pg (ref 26.0–34.0)
MCH: 32.1 pg (ref 26.0–34.0)
MCH: 32.1 pg (ref 26.0–34.0)
MCH: 32.2 pg (ref 26.0–34.0)
MCHC: 32.5 g/dL (ref 30.0–36.0)
MCHC: 33.1 g/dL (ref 30.0–36.0)
MCHC: 33.3 g/dL (ref 30.0–36.0)
MCHC: 33.6 g/dL (ref 30.0–36.0)
MCV: 95.9 fL (ref 80.0–100.0)
MCV: 96.3 fL (ref 80.0–100.0)
MCV: 96.4 fL (ref 80.0–100.0)
MCV: 96.9 fL (ref 80.0–100.0)
Platelets: 100 10*3/uL — ABNORMAL LOW (ref 150–400)
Platelets: 86 10*3/uL — ABNORMAL LOW (ref 150–400)
Platelets: 89 10*3/uL — ABNORMAL LOW (ref 150–400)
Platelets: 91 10*3/uL — ABNORMAL LOW (ref 150–400)
RBC: 2.95 MIL/uL — ABNORMAL LOW (ref 4.22–5.81)
RBC: 2.99 MIL/uL — ABNORMAL LOW (ref 4.22–5.81)
RBC: 3.32 MIL/uL — ABNORMAL LOW (ref 4.22–5.81)
RBC: 3.58 MIL/uL — ABNORMAL LOW (ref 4.22–5.81)
RDW: 13.6 % (ref 11.5–15.5)
RDW: 13.6 % (ref 11.5–15.5)
RDW: 13.7 % (ref 11.5–15.5)
RDW: 13.8 % (ref 11.5–15.5)
WBC: 10.1 10*3/uL (ref 4.0–10.5)
WBC: 10.4 10*3/uL (ref 4.0–10.5)
WBC: 11.2 10*3/uL — ABNORMAL HIGH (ref 4.0–10.5)
WBC: 9.6 10*3/uL (ref 4.0–10.5)
nRBC: 0 % (ref 0.0–0.2)
nRBC: 0 % (ref 0.0–0.2)
nRBC: 0 % (ref 0.0–0.2)
nRBC: 0 % (ref 0.0–0.2)

## 2021-10-07 LAB — FIBRINOGEN
Fibrinogen: 243 mg/dL (ref 210–475)
Fibrinogen: 257 mg/dL (ref 210–475)
Fibrinogen: 262 mg/dL (ref 210–475)
Fibrinogen: 273 mg/dL (ref 210–475)

## 2021-10-07 LAB — LACTIC ACID, PLASMA: Lactic Acid, Venous: 1.5 mmol/L (ref 0.5–1.9)

## 2021-10-07 LAB — GLUCOSE, CAPILLARY
Glucose-Capillary: 81 mg/dL (ref 70–99)
Glucose-Capillary: 140 mg/dL — ABNORMAL HIGH (ref 70–99)
Glucose-Capillary: 163 mg/dL — ABNORMAL HIGH (ref 70–99)
Glucose-Capillary: 110 mg/dL — ABNORMAL HIGH (ref 70–99)

## 2021-10-07 LAB — HEPARIN LEVEL (UNFRACTIONATED)
Heparin Unfractionated: 0.29 IU/mL — ABNORMAL LOW (ref 0.30–0.70)
Heparin Unfractionated: 0.38 IU/mL (ref 0.30–0.70)
Heparin Unfractionated: 0.45 IU/mL (ref 0.30–0.70)
Heparin Unfractionated: 0.51 IU/mL (ref 0.30–0.70)
Heparin Unfractionated: 0.56 IU/mL (ref 0.30–0.70)

## 2021-10-07 LAB — BASIC METABOLIC PANEL
Anion gap: 6 (ref 5–15)
BUN: 22 mg/dL (ref 8–23)
CO2: 19 mmol/L — ABNORMAL LOW (ref 22–32)
Calcium: 8.8 mg/dL — ABNORMAL LOW (ref 8.9–10.3)
Chloride: 115 mmol/L — ABNORMAL HIGH (ref 98–111)
Creatinine, Ser: 1.61 mg/dL — ABNORMAL HIGH (ref 0.61–1.24)
GFR, Estimated: 44 mL/min — ABNORMAL LOW (ref >60)
Glucose, Bld: 106 mg/dL — ABNORMAL HIGH (ref 70–99)
Potassium: 3.9 mmol/L (ref 3.5–5.1)
Sodium: 140 mmol/L (ref 135–145)

## 2021-10-07 LAB — MAGNESIUM: Magnesium: 1.8 mg/dL (ref 1.7–2.4)

## 2021-10-07 LAB — PHOSPHORUS: Phosphorus: 2.1 mg/dL — ABNORMAL LOW (ref 2.5–4.6)

## 2021-10-07 MED ORDER — K PHOS MONO-SOD PHOS DI & MONO 155-852-130 MG PO TABS
500.0000 mg | ORAL_TABLET | ORAL | Status: AC
  Administered 2021-10-07 (×3): 500 mg via ORAL
  Filled 2021-10-07 (×4): qty 2

## 2021-10-07 MED ORDER — MAGNESIUM SULFATE 2 GM/50ML IV SOLN
2.0000 g | Freq: Once | INTRAVENOUS | Status: AC
  Administered 2021-10-07: 2 g via INTRAVENOUS
  Filled 2021-10-07: qty 50

## 2021-10-07 MED ORDER — SODIUM CHLORIDE 0.9 % IV SOLN: 250.0000 mL | INTRAVENOUS | Status: DC | PRN

## 2021-10-07 MED ORDER — HYDROCOD POLI-CHLORPHE POLI ER 10-8 MG/5ML PO SUER
5.0000 mL | Freq: Four times a day (QID) | ORAL | Status: DC | PRN
  Administered 2021-10-07: 5 mL via ORAL
  Filled 2021-10-07: qty 5

## 2021-10-07 MED ORDER — SODIUM CHLORIDE 0.9 % IV SOLN: INTRAVENOUS | Status: DC | PRN

## 2021-10-07 MED ORDER — POTASSIUM PHOSPHATES 15 MMOLE/5ML IV SOLN
15.0000 mmol | Freq: Once | INTRAVENOUS | Status: DC
  Administered 2021-10-07: 15 mmol via INTRAVENOUS
  Filled 2021-10-07: qty 5

## 2021-10-07 MED ORDER — HYDROCODONE BIT-HOMATROP MBR 5-1.5 MG/5ML PO SOLN
5.0000 mL | Freq: Four times a day (QID) | ORAL | Status: DC | PRN
Start: 2021-10-07 — End: 2021-10-07

## 2021-10-07 NOTE — Progress Notes (Signed)
Progress Note  Patient Name: Douglas Hoffman Date of Encounter: 10/07/2021  [facility] Cardiologist: None   Subjective   Reports feeling improved.  Denies any chest pain or dyspnea.  Has had some hemoptysis this morning  Inpatient Medications    Scheduled Meds:  aspirin EC  81 mg Oral Daily   atorvastatin  80 mg Oral Daily   Chlorhexidine Gluconate Cloth  6 each Topical Daily   insulin aspart  0-9 Units Subcutaneous Q4H   sodium chloride flush  3 mL Intravenous Q12H   Continuous Infusions:  sodium chloride 20.8 mL/hr at 10/07/21 0900   sodium chloride 20.8 mL/hr at 10/07/21 0900   sodium chloride 20 mL/hr at 10/07/21 0900   sodium chloride 20 mL/hr at 10/07/21 0900   heparin 1,050 Units/hr (10/07/21 0900)   potassium PHOSPHATE IVPB (in mmol) 43 mL/hr at 10/07/21 0900   PRN Meds: sodium chloride, sodium chloride, acetaminophen, docusate sodium, melatonin, polyethylene glycol, prochlorperazine, sodium chloride flush   Vital Signs    Vitals:   10/07/21 0600 10/07/21 0700 10/07/21 0800 10/07/21 0900  BP: 121/79 (!) 154/93 129/80 121/79  Pulse: 68 65 69 66  Resp: '14 19 17 18  '$ Temp:   98.3 F (36.8 C)   TempSrc:   Oral   SpO2: 99% 99% 98% 100%  Weight:      Height:        Intake/Output Summary (Last 24 hours) at 10/07/2021 0952 Last data filed at 10/07/2021 0900 Gross per 24 hour  Intake 1899.61 ml  Output 1025 ml  Net 874.61 ml      10/05/2021   11:02 PM 06/06/2019    7:09 AM 05/23/2019   12:47 PM  Last 3 Weights  Weight (lbs) 198 lb 204 lb 204 lb  Weight (kg) 89.812 kg 92.534 kg 92.534 kg      Telemetry    Normal sinus rhythm 60s to 70s- Personally Reviewed  ECG    No new ECG- Personally Reviewed  Physical Exam   GEN: No acute distress.   Neck: Dressing over R side of neck Cardiac: RRR, no murmurs Respiratory: Diminished at bases GI: Soft, nontender,  MS: No edema Neuro:  Nonfocal  Psych: Normal affect   Labs    High Sensitivity Troponin:    Recent Labs  Lab 10/05/21 2305 10/06/21 0319 10/06/21 0638 10/06/21 1001  TROPONINIHS 259* 869* 806* 810*     Chemistry Recent Labs  Lab 10/05/21 2305 10/05/21 2325 10/06/21 0636 10/07/21 0515  NA 139 140 140 140  K 4.4 5.2* 4.9 3.9  CL 110 110 119* 115*  CO2 20*  --  14* 19*  GLUCOSE 190* 186* 145* 106*  BUN 20 29* 20 22  CREATININE 2.01* 2.00* 1.75* 1.61*  CALCIUM 8.8*  --  8.2* 8.8*  MG 1.9  --  1.9 1.8  PROT 6.1*  --  5.3*  --   ALBUMIN 3.3*  --  2.8*  --   AST 28  --  32  --   ALT 21  --  23  --   ALKPHOS 72  --  71  --   BILITOT 0.8  --  0.5  --   GFRNONAA 34*  --  40* 44*  ANIONGAP 9  --  7 6    Lipids  Recent Labs  Lab 10/06/21 0637  CHOL 163  TRIG 21  HDL 34*  LDLCALC 125*  CHOLHDL 4.8    Hematology Recent Labs  Lab 10/06/21 1812  10/06/21 2350 10/07/21 0515  WBC 10.9* 10.1 9.6  RBC 3.79* 3.58* 3.32*  HGB 11.7* 11.5* 10.4*  HCT 37.2* 34.7* 32.0*  MCV 98.2 96.9 96.4  MCH 30.9 32.1 31.3  MCHC 31.5 33.1 32.5  RDW 13.7 13.7 13.8  PLT 102* 89* 91*   Thyroid  Recent Labs  Lab 10/06/21 0637  TSH 0.601    BNP Recent Labs  Lab 10/06/21 0638  BNP 71.7    DDimer  Recent Labs  Lab 10/05/21 2305  DDIMER >20.00*     Radiology    DG Abd 1 View  Result Date: 10/06/2021 CLINICAL DATA:  Diarrhea. EXAM: ABDOMEN - 1 VIEW COMPARISON:  February 22, 2012 FINDINGS: A paucity of bowel gas is noted throughout the abdomen with a small amount of aerated bowel seen within the mid to upper right abdomen and left lower quadrant. No radio-opaque calculi or other significant radiographic abnormality are seen. 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 IMPRESSION: Paucity of bowel gas throughout the abdomen without evidence of bowel obstruction. Electronically Signed   By: Virgina Norfolk M.D.   On: 10/06/2021 04:10   CT Angio Chest Pulmonary Embolism (PE) W or WO Contrast  Addendum Date:  10/06/2021   ADDENDUM REPORT: 10/06/2021 12:39 ADDENDUM: Critical Value/emergent results were called by telephone at the time of interpretation on 10/06/2021 at 12:30 pm to provider Dr. Tawanna Solo, who verbally acknowledged these results. Initial attempts to contact the emergency department begun at 12:08 p.m. Electronically Signed   By: Suzy Bouchard M.D.   On: 10/06/2021 12:39   Result Date: 10/06/2021 CLINICAL DATA:  Elevated D-dimer.  Concern for pulmonary embolism. EXAM: CT ANGIOGRAPHY CHEST WITH CONTRAST TECHNIQUE: Multidetector CT imaging of the chest was performed using the standard protocol during bolus administration of intravenous contrast. Multiplanar CT image reconstructions and MIPs were obtained to evaluate the vascular anatomy. RADIATION DOSE REDUCTION: This exam was performed according to the departmental dose-optimization program which includes automated exposure control, adjustment of the mA and/or kV according to patient size and/or use of iterative reconstruction technique. CONTRAST:  58m OMNIPAQUE IOHEXOL 350 MG/ML SOLN COMPARISON:  None Available. FINDINGS: Cardiovascular: Bilateral large filling defects within the proximal LEFT and RIGHT main pulmonary arteries consistent acute pulmonary emboli. These emboli are occlusive and extend into the bilateral lower lobes and bilateral upper lobes. RIGHT ventricular diameter to LEFT ventricular diameter ratio (5.8 : 2.3) = 2.5 Mediastinum/Nodes: No axillary or supraclavicular adenopathy. No mediastinal or hilar adenopathy. No pericardial fluid. Esophagus normal. Lungs/Pleura: No pulmonary infarction. Bilateral small pleural effusions. No pneumothorax. Upper Abdomen: Limited view of the liver, kidneys, pancreas are unremarkable. Normal adrenal glands. Musculoskeletal: No aggressive osseous lesion. Chronic elevation of the LEFT hemidiaphragm. Review of the MIP images confirms the above findings. IMPRESSION: 1. Acute bilateral pulmonary emboli. Pulmonary  emboli are proximal and occlusive with severe clot burden. 2. Positive for acute PE with CT evidence of right heart strain (RV/LV Ratio = 2.5) consistent with at least submassive (intermediate risk) PE. The presence of right heart strain has been associated with an increased risk of morbidity and mortality. Please refer to the "Code PE Focused" order set in EPIC. 3. Chronic elevation LEFT hemidiaphragm. Electronically Signed: By: SSuzy BouchardM.D. On: 10/06/2021 12:17   DG Chest Port 1 View  Result Date: 10/05/2021 CLINICAL DATA:  CP, hypotension EXAM: PORTABLE CHEST 1 VIEW COMPARISON:  None Available. FINDINGS: The heart and mediastinal contours are  within normal limits. Aortic calcification. Low lung volumes and elevated left hemidiaphragm. Left base atelectasis. No focal consolidation. No pulmonary edema. No pleural effusion. No pneumothorax. No acute osseous abnormality. IMPRESSION: 1. Low lung volumes with no active disease. 2.  Aortic Atherosclerosis (ICD10-I70.0). Electronically Signed   By: Iven Finn M.D.   On: 10/05/2021 23:22   ECHOCARDIOGRAM COMPLETE  Result Date: 10/06/2021    ECHOCARDIOGRAM REPORT   Patient Name:   Douglas Hoffman Date of Exam: 10/06/2021 Medical Rec #:  [id number]    Height:       72.0 in Accession #:    [id number]   Weight:       198.0 lb Date of Birth:  12-02-1945    BSA:          2.121 m Patient Age:    6 years     BP:           86/75 mmHg Patient Gender: M            HR:           75 bpm. Exam Location:  Inpatient Procedure: 2D Echo, Cardiac Doppler, Color Doppler and Intracardiac            Opacification Agent Indications:    CHF  History:        Patient has no prior history of Echocardiogram examinations.                 Risk Factors:Hypertension.  Sonographer:    Jefferey Pica Referring Phys: [id number] [place]  1. Left ventricular ejection fraction, by estimation, is 55 to 60%. The left ventricle has normal function. The left ventricle has no  regional wall motion abnormalities. There is mild concentric left ventricular hypertrophy. Left ventricular diastolic parameters are indeterminate.  2. Right ventricular systolic function is mildly reduced. The right ventricular size is mildly enlarged. There is moderately elevated pulmonary artery systolic pressure.  3. The mitral valve is normal in structure. No evidence of mitral valve regurgitation. No evidence of mitral stenosis.  4. Tricuspid valve regurgitation is mild to moderate.  5. The aortic valve is tricuspid. Aortic valve regurgitation is not visualized. No aortic stenosis is present.  6. There is borderline dilatation of the aortic root, measuring 36 mm. There is mild dilatation of the ascending aorta, measuring 38 mm.  7. The inferior vena cava is dilated in size with <50% respiratory variability, suggesting right atrial pressure of 15 mmHg. Comparison(s): No prior Echocardiogram. FINDINGS  Left Ventricle: Left ventricular ejection fraction, by estimation, is 55 to 60%. The left ventricle has normal function. The left ventricle has no regional wall motion abnormalities. The left ventricular internal cavity size was normal in size. There is  mild concentric left ventricular hypertrophy. Left ventricular diastolic parameters are indeterminate. Right Ventricle: The right ventricular size is mildly enlarged. No increase in right ventricular wall thickness. Right ventricular systolic function is mildly reduced. There is moderately elevated pulmonary artery systolic pressure. The tricuspid regurgitant velocity is 2.89 m/s, and with an assumed right atrial pressure of 15 mmHg, the estimated right ventricular systolic pressure is 43.3 mmHg. Left Atrium: Left atrial size was normal in size. Right Atrium: Right atrial size was normal in size. Pericardium: There is no evidence of pericardial effusion. Presence of epicardial fat layer. Mitral Valve: The mitral valve is normal in structure. No evidence of mitral  valve regurgitation. No evidence of mitral valve stenosis. Tricuspid Valve: The tricuspid valve is normal in structure.  Tricuspid valve regurgitation is mild to moderate. No evidence of tricuspid stenosis. Aortic Valve: The aortic valve is tricuspid. Aortic valve regurgitation is not visualized. No aortic stenosis is present. Aortic valve peak gradient measures 3.1 mmHg. Pulmonic Valve: The pulmonic valve was normal in structure. Pulmonic valve regurgitation is not visualized. No evidence of pulmonic stenosis. Aorta: The aortic root is normal in size and structure. There is borderline dilatation of the aortic root, measuring 36 mm. There is mild dilatation of the ascending aorta, measuring 38 mm. Venous: The inferior vena cava is dilated in size with less than 50% respiratory variability, suggesting right atrial pressure of 15 mmHg. IAS/Shunts: No atrial level shunt detected by color flow Doppler.  LEFT VENTRICLE PLAX 2D LVIDd:         3.60 cm   Diastology LVIDs:         2.30 cm   LV e' medial:    5.98 cm/s LV PW:         1.00 cm   LV E/e' medial:  7.6 LV IVS:        1.10 cm   LV e' lateral:   6.53 cm/s LVOT diam:     2.10 cm   LV E/e' lateral: 7.0 LVOT Area:     3.46 cm  IVC IVC diam: 2.80 cm LEFT ATRIUM             Index        RIGHT ATRIUM           Index LA diam:        2.60 cm 1.23 cm/m   RA Area:     11.40 cm LA Vol (A2C):   32.4 ml 15.27 ml/m  RA Volume:   23.30 ml  10.98 ml/m LA Vol (A4C):   24.0 ml 11.31 ml/m LA Biplane Vol: 29.1 ml 13.72 ml/m  AORTIC VALVE AV Vmax:      88.67 cm/s AV Peak Grad: 3.1 mmHg  AORTA Ao Root diam: 3.60 cm Ao Asc diam:  3.80 cm MITRAL VALVE               TRICUSPID VALVE MV Area (PHT): 3.76 cm    TR Peak grad:   33.4 mmHg MV Decel Time: 202 msec    TR Vmax:        289.00 cm/s MV E velocity: 45.60 cm/s MV A velocity: 88.60 cm/s  SHUNTS MV E/A ratio:  0.51        Systemic Diam: 2.10 cm Kardie Tobb DO Electronically signed by Berniece Salines DO Signature Date/Time:  10/06/2021/2:03:08 PM    Final    IR INFUSION THROMBOL VENOUS INITIAL (MS)  Result Date: 10/07/2021 INDICATION: Sub massive bilateral pulmonary emboli.  Elevated creatinine. EXAM: 1. ULTRASOUND GUIDANCE FOR VENOUS ACCESS X2 2. BILATERAL PULMONARY ARTERIOGRAPHY 3. FLUOROSCOPIC GUIDED PLACEMENT OF BILATERAL PULMONARY ARTERIAL LYTIC INFUSION CATHETERS COMPARISON:  CTA from earlier the same day MEDICATIONS: Intravenous Fentanyl 28mg and Versed 1.'5mg'$  were administered as conscious sedation during continuous monitoring of the patient's level of consciousness and physiological / cardiorespiratory status by the radiology RN, with a total moderate sedation time of 32 minutes. CONTRAST:  None required FLUOROSCOPY TIME:  Radiation Exposure Index (as provided by the fluoroscopic device): 75 mGy air Kerma COMPLICATIONS: None immediate TECHNIQUE: Informed written consent was obtained from the patient after a discussion of the risks, benefits and alternatives to treatment. Questions regarding the procedure were encouraged and answered. A timeout was performed prior to the initiation  of the procedure. Ultrasound scanning was performed demonstrating patency of the right internal jugular vein, selected for vascular access. The right neck was prepped and draped in the usual sterile fashion, and a sterile drape was applied covering the operative field. Maximum barrier sterile technique with sterile gowns and gloves were used for the procedure. A timeout was performed prior to the initiation of the procedure. Local anesthesia was provided with 1% lidocaine. Under direct ultrasound guidance, the right internal jugular vein was accessed with a micro puncture sheath ultimately allowing placement of a 6 French vascular sheath. Slightly cranial to this initial access, the right internal jugular vein was again accessed with a micropuncture sheath ultimately allowing placement of a 6 French vascular sheath. With the use of a Bentson wire, an  angled pigtail catheter was advanced into the left main pulmonary artery . Pressure measurements were then obtained from the left pulmonary artery. Over an exchange length Rosen wire, the pigtail catheter was exchanged for a 90/10 cm multi side-hole infusion catheter. With the use of a Bentson wire, a pigtail catheter was advanced into the right main pulmonary artery . Over an exchange length Rosen wire, the pigtail catheter was exchanged for a 90/15 cm multi side-hole infusion catheter. A postprocedural fluoroscopic image was obtained of the check demonstrating final catheter positioning. Both vascular sheath were secured at the right neck with 0 silk suture. The external catheter tubing was secured at the right neck and the lytic therapy was initiated. The patient tolerated the procedure well without immediate postprocedural complication. FINDINGS: Acquired pressure measurements: Left main pulmonary artery-40/10 mmHg mean 22 (normal: < 25/10) Following the procedure, both infusion catheter tips terminate within the distal aspects of the bilateral lower lobe sub segmental pulmonary arteries. IMPRESSION: 1. Successful fluoroscopic guided initiation of bilateral catheter directed pulmonary arterial lysis for sub massive pulmonary embolism and right-sided heart strain. 2. Elevated pressure measurements within the left main pulmonary artery compatible with pulmonary arterial hypertension. PLAN: -follow-up pulmonary arterial pressure measurements after the 12 hour catheter directed pulmonary arterial lysis Electronically Signed   By: Lucrezia Europe M.D.   On: 10/07/2021 08:34    Cardiac Studies   Echo 10/06/21:  1. Left ventricular ejection fraction, by estimation, is 55 to 60%. The  left ventricle has normal function. The left ventricle has no regional  wall motion abnormalities. There is mild concentric left ventricular  hypertrophy. Left ventricular diastolic  parameters are indeterminate.   2. Right ventricular  systolic function is mildly reduced. The right  ventricular size is mildly enlarged. There is moderately elevated  pulmonary artery systolic pressure.   3. The mitral valve is normal in structure. No evidence of mitral valve  regurgitation. No evidence of mitral stenosis.   4. Tricuspid valve regurgitation is mild to moderate.   5. The aortic valve is tricuspid. Aortic valve regurgitation is not  visualized. No aortic stenosis is present.   6. There is borderline dilatation of the aortic root, measuring 36 mm.  There is mild dilatation of the ascending aorta, measuring 38 mm.   7. The inferior vena cava is dilated in size with <50% respiratory  variability, suggesting right atrial pressure of 15 mmHg.   Patient Profile     76 y.o. male with a hx of hypertension, DVT, prostate cancer, CKD who is being seen 10/06/2021 for the evaluation of chest pain   Assessment & Plan    Shock: secondary to submassive PE.  Status post catheter directed thrombolysis improved, now  normotensive off pressors  Submassive PE: Underwent catheter directed thrombolytics 6/5.  On heparin drip.  Elevated troponin: Presented with chest pain and troponin elevation of 869.  Suspect demand ischemia/RV strain in setting of submassive PE.  Echo shows normal LV systolic function, severe RV dysfunction by my read.  No further cardiac work-up recommended at this time  AKI: Creatinine 2 on admission, likely due to shock.  Baseline creatinine 1.4-1.7, is now back to baseline   For questions or updates, please contact [place] Please consult [email or web address] for contact info under        Signed, Donato Heinz, MD  10/07/2021, 9:52 AM

## 2021-10-07 NOTE — Progress Notes (Signed)
[facility] ADULT ICU REPLACEMENT PROTOCOL   The patient does apply for the [facility] Adult ICU Electrolyte Replacment Protocol based on the criteria listed below:   1.Exclusion criteria: TCTS patients, ECMO patients, and Dialysis patients 2. Is GFR >/= 30 ml/min? Yes.    Patient's GFR today is 44 3. Is SCr </= 2? Yes.   Patient's SCr is 1.61 mg/dL 4. Did SCr increase >/= 0.5 in 24 hours? No. 5.Pt's weight >40kg  Yes.   6. Abnormal electrolyte(s): mag 1.8, phos 2.1  7. Electrolytes replaced per protocol 8.  Call MD STAT for K+ </= 2.5, Phos </= 1, or Mag </= 1 Physician:  n/a  Douglas Hoffman 10/07/2021 6:37 AM

## 2021-10-07 NOTE — Progress Notes (Signed)
0930 - Family called RN to bedside, pt coughed up sputum with red streaking in it. No bleeding noted in mouth, throat, or nose. This RN called Dr. Silas Flood, made aware. No frank bleeding and no blood clots noted. No new orders at this time. Vitals signs remain stable. Pt and family updated.

## 2021-10-07 NOTE — Hospital Course (Signed)
Mr. Oshita is a 76 y.o. M with HTN, prior VTE off AC, prosCA and CKD IIIb baseline 1.4-1.6 who presented with chest pain and diaphoresis.     6/5: Admitted on heparin, needed levophed briefly in the ER, suspected to be NSTEMI --> subsequent echo showed RHF c/w PE and follow up CTA showed submassive PE --> IR performed catheter direct thrombolysis 6/6: Stable, off pressors, transferred OOU

## 2021-10-07 NOTE — Progress Notes (Signed)
ANTICOAGULATION CONSULT NOTE  Pharmacy Consult for Heparin Indication: PE  No Known Allergies  Patient Measurements: Height: 6' (182.9 cm) Weight: 89.8 kg (198 lb) IBW/kg (Calculated) : 77.6  Vital Signs: Temp: 98.1 F (36.7 C) (06/05 1927) Temp Source: Oral (06/05 1927) BP: 123/85 (06/06 0000) Pulse Rate: 64 (06/06 0000)  Labs: Recent Labs    10/05/21 2305 10/05/21 2325 10/06/21 0319 10/06/21 0636 10/06/21 4801 10/06/21 1001 10/06/21 1812 10/06/21 2350  HGB 13.0 13.3  --  11.8*  --   --  11.7* 11.5*  HCT 40.2 39.0  --  36.5*  --   --  37.2* 34.7*  PLT 113*  --   --  96*  --   --  102* 89*  LABPROT 15.1  --   --   --   --   --   --   --   INR 1.2  --   --   --   --   --   --   --   HEPARINUNFRC  --   --   --   --   --  0.46 0.71* 0.56  CREATININE 2.01* 2.00*  --  1.75*  --   --   --   --   TROPONINIHS 259*  --  869*  --  806* 810*  --   --      Estimated Creatinine Clearance: 39.4 mL/min (A) (by C-G formula based on SCr of 1.75 mg/dL (H)).  Assessment: 76 y.o. male with hypotension/weakness and elevated troponin and d-dimer.  Pharmacy consulted to dose IV heparin for ACS vs PE.   Patient has a PE - getting catheter directed TPA infusion  Heparin level slightly supratherapeutic; no bleeding reported.  H/H stable, plts low at 96K (range 110-150s since 2020).  Goal of Therapy:  Heparin level 0.3-0.7 units/ml Monitor platelets by anticoagulation protocol: Yes   Plan:  Cont heparin at 1050 units/hr 0800 heparin level  Narda Bonds, PharmD, BCPS Clinical Pharmacist Phone: [phone]

## 2021-10-07 NOTE — TOC Benefit Eligibility Note (Signed)
Patient Teacher, English as a foreign language completed.    The patient is currently admitted and upon discharge could be taking Eliquis 5 mg.  The current 30 day co-pay is, $45.00.   The patient is insured through [place] [place] Patient Advocate Specialist [place] Patient Advocate Team Direct Number: [phone]  Fax: [phone]

## 2021-10-07 NOTE — Progress Notes (Signed)
ANTICOAGULATION CONSULT NOTE  Pharmacy Consult for Heparin Indication: PE  No Known Allergies  Patient Measurements: Height: 6' (182.9 cm) Weight: 89.8 kg (198 lb) IBW/kg (Calculated) : 77.6  Vital Signs: Temp: 98.3 F (36.8 C) (06/06 0800) Temp Source: Oral (06/06 0800) BP: 154/93 (06/06 0700) Pulse Rate: 65 (06/06 0700)  Labs: Recent Labs    10/05/21 2305 10/05/21 2325 10/06/21 0319 10/06/21 0636 10/06/21 0636 10/06/21 8588 10/06/21 1001 10/06/21 1812 10/06/21 2350 10/07/21 0515 10/07/21 0750  HGB 13.0 13.3  --  11.8*  --   --   --  11.7* 11.5* 10.4*  --   HCT 40.2 39.0  --  36.5*  --   --   --  37.2* 34.7* 32.0*  --   PLT 113*  --   --  96*  --   --   --  102* 89* 91*  --   LABPROT 15.1  --   --   --   --   --   --   --   --   --   --   INR 1.2  --   --   --   --   --   --   --   --   --   --   HEPARINUNFRC  --   --   --   --    < >  --  0.46 0.71* 0.56 0.51 0.45  CREATININE 2.01* 2.00*  --  1.75*  --   --   --   --   --  1.61*  --   TROPONINIHS 259*  --  869*  --   --  806* 810*  --   --   --   --    < > = values in this interval not displayed.     Estimated Creatinine Clearance: 42.8 mL/min (A) (by C-G formula based on SCr of 1.61 mg/dL (H)).  Assessment: 76 y.o. male with hypotension/weakness and elevated troponin and d-dimer.  Pharmacy consulted to dose IV heparin for ACS vs PE.   Patient has a PE - getting catheter directed TPA infusion  Heparin level 0.45 (on heparin 1050 units/hr)   Goal of Therapy:  Heparin level 0.3-0.7 units/ml Monitor platelets by anticoagulation protocol: Yes   Plan:  Heparin drip at 1050 units/hr  Daily heparin level and CBC ordered Monitor for signs/symptoms of bleed Plan for transition to [place] 6/7  Thank you for allowing pharmacy to be a part of this patient's care.  Donnald Garre, PharmD Clinical Pharmacist  Please check AMION for all [place] numbers After 10:00 PM, call [place] [phone]

## 2021-10-07 NOTE — Progress Notes (Signed)
Referring Physician(s): Jacky Kindle, MD  Supervising Physician: Ruthann Cancer  Patient Status:  [facility] - In-pt  Chief Complaint: Follow up bilateral PE lysis 10/06/21 in IR  Subjective:  Patient sitting up in bed, wife at bedside. He denies any complaints - specifically no chest pain or dyspnea. His wife notes that his right arm was swollen this morning and was not swollen yesterday evening.   Allergies: Patient has no known allergies.  Medications: Prior to Admission medications   Medication Sig Start Date End Date Taking? Authorizing Provider  acetaminophen (TYLENOL) 500 MG tablet Take 1,000 mg by mouth every 6 (six) hours as needed (pain).   Yes [provider]  amLODipine (NORVASC) 5 MG tablet Take 5 mg by mouth every morning.   Yes [provider]  lisinopril (PRINIVIL,ZESTRIL) 20 MG tablet Take 20 mg by mouth at bedtime.   Yes [provider]  triamcinolone cream (KENALOG) 0.1 % Apply 1 application. topically 2 (two) times daily as needed for rash. 10/04/21  Yes [provider]  apixaban (ELIQUIS) 2.5 MG TABS tablet Take 1 tablet (2.5 mg total) by mouth 2 (two) times daily. Patient not taking: Reported on 10/06/2021 07/08/18   Rod Can, MD     Vital Signs: BP 121/79   Pulse 66   Temp 98.3 F (36.8 C) (Oral)   Resp 18   Ht 6' (1.829 m)   Wt 198 lb (89.8 kg)   SpO2 100%   BMI 26.85 kg/m   Physical Exam Vitals and nursing note reviewed.  Constitutional:      General: He is not in acute distress.    Appearance: He is not ill-appearing.  HENT:     Head: Normocephalic.  Cardiovascular:     Rate and Rhythm: Normal rate and regular rhythm.     Comments: (+) Right IJ sheaths removed during my visit without complication.  Pulmonary:     Effort: Pulmonary effort is normal.     Breath sounds: Normal breath sounds.  Abdominal:     General: There is no distension.     Palpations: Abdomen is soft.  Musculoskeletal:     Comments:  (+) Right forearm with tense swelling from wrist to elbow, palpable pulses, full ROM, sensation in tact. There is a peripheral IV in the right [facility] which was assess by the RN during my exam and flushes/aspirates easily. There is diffuse bruising around the IV itself. BP cuff on right forearm during exam, moved to left forearm by RN.  Skin:    General: Skin is warm and dry.  Neurological:     Mental Status: He is alert. Mental status is at baseline.    Imaging: DG Abd 1 View  Result Date: 10/06/2021 CLINICAL DATA:  Diarrhea. EXAM: ABDOMEN - 1 VIEW COMPARISON:  February 22, 2012 FINDINGS: A paucity of bowel gas is noted throughout the abdomen with a small amount of aerated bowel seen within the mid to upper right abdomen and left lower quadrant. No radio-opaque calculi or other significant radiographic abnormality are seen. 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 IMPRESSION: Paucity of bowel gas throughout the abdomen without evidence of bowel obstruction. Electronically Signed   By: Virgina Norfolk M.D.   On: 10/06/2021 04:10   CT Angio Chest Pulmonary Embolism (PE) W or WO Contrast  Addendum Date: 10/06/2021   ADDENDUM REPORT: 10/06/2021 12:39 ADDENDUM: Critical Value/emergent results were called by telephone at the  time of interpretation on 10/06/2021 at 12:30 pm to provider Dr. Tawanna Solo, who verbally acknowledged these results. Initial attempts to contact the emergency department begun at 12:08 p.m. Electronically Signed   By: Suzy Bouchard M.D.   On: 10/06/2021 12:39   Result Date: 10/06/2021 CLINICAL DATA:  Elevated D-dimer.  Concern for pulmonary embolism. EXAM: CT ANGIOGRAPHY CHEST WITH CONTRAST TECHNIQUE: Multidetector CT imaging of the chest was performed using the standard protocol during bolus administration of intravenous contrast. Multiplanar CT image reconstructions and MIPs were obtained to evaluate the vascular  anatomy. RADIATION DOSE REDUCTION: This exam was performed according to the departmental dose-optimization program which includes automated exposure control, adjustment of the mA and/or kV according to patient size and/or use of iterative reconstruction technique. CONTRAST:  16m OMNIPAQUE IOHEXOL 350 MG/ML SOLN COMPARISON:  None Available. FINDINGS: Cardiovascular: Bilateral large filling defects within the proximal LEFT and RIGHT main pulmonary arteries consistent acute pulmonary emboli. These emboli are occlusive and extend into the bilateral lower lobes and bilateral upper lobes. RIGHT ventricular diameter to LEFT ventricular diameter ratio (5.8 : 2.3) = 2.5 Mediastinum/Nodes: No axillary or supraclavicular adenopathy. No mediastinal or hilar adenopathy. No pericardial fluid. Esophagus normal. Lungs/Pleura: No pulmonary infarction. Bilateral small pleural effusions. No pneumothorax. Upper Abdomen: Limited view of the liver, kidneys, pancreas are unremarkable. Normal adrenal glands. Musculoskeletal: No aggressive osseous lesion. Chronic elevation of the LEFT hemidiaphragm. Review of the MIP images confirms the above findings. IMPRESSION: 1. Acute bilateral pulmonary emboli. Pulmonary emboli are proximal and occlusive with severe clot burden. 2. Positive for acute PE with CT evidence of right heart strain (RV/LV Ratio = 2.5) consistent with at least submassive (intermediate risk) PE. The presence of right heart strain has been associated with an increased risk of morbidity and mortality. Please refer to the "Code PE Focused" order set in EPIC. 3. Chronic elevation LEFT hemidiaphragm. Electronically Signed: By: SSuzy BouchardM.D. On: 10/06/2021 12:17   DG Chest Port 1 View  Result Date: 10/05/2021 CLINICAL DATA:  CP, hypotension EXAM: PORTABLE CHEST 1 VIEW COMPARISON:  None Available. FINDINGS: The heart and mediastinal contours are within normal limits. Aortic calcification. Low lung volumes and elevated  left hemidiaphragm. Left base atelectasis. No focal consolidation. No pulmonary edema. No pleural effusion. No pneumothorax. No acute osseous abnormality. IMPRESSION: 1. Low lung volumes with no active disease. 2.  Aortic Atherosclerosis (ICD10-I70.0). Electronically Signed   By: MIven FinnM.D.   On: 10/05/2021 23:22   ECHOCARDIOGRAM COMPLETE  Result Date: 10/06/2021    ECHOCARDIOGRAM REPORT   Patient Name:   MARMISTEAD SULTDate of Exam: 10/06/2021 Medical Rec #:  [id number]   Height:       72.0 in Accession #:    [id number]  Weight:       198.0 lb Date of Birth:  [birth year]   BSA:          2.121 m Patient Age:    89years     BP:           86/75 mmHg Patient Gender: M            HR:           75 bpm. Exam Location:  Inpatient Procedure: 2D Echo, Cardiac Doppler, Color Doppler and Intracardiac            Opacification Agent Indications:    CHF  History:        Patient has no prior history of Echocardiogram  examinations.                 Risk Factors:Hypertension.  Sonographer:    Jefferey Pica Referring Phys: [id number] [place]  1. Left ventricular ejection fraction, by estimation, is 55 to 60%. The left ventricle has normal function. The left ventricle has no regional wall motion abnormalities. There is mild concentric left ventricular hypertrophy. Left ventricular diastolic parameters are indeterminate.  2. Right ventricular systolic function is mildly reduced. The right ventricular size is mildly enlarged. There is moderately elevated pulmonary artery systolic pressure.  3. The mitral valve is normal in structure. No evidence of mitral valve regurgitation. No evidence of mitral stenosis.  4. Tricuspid valve regurgitation is mild to moderate.  5. The aortic valve is tricuspid. Aortic valve regurgitation is not visualized. No aortic stenosis is present.  6. There is borderline dilatation of the aortic root, measuring 36 mm. There is mild dilatation of the ascending aorta, measuring 38 mm.   7. The inferior vena cava is dilated in size with <50% respiratory variability, suggesting right atrial pressure of 15 mmHg. Comparison(s): No prior Echocardiogram. FINDINGS  Left Ventricle: Left ventricular ejection fraction, by estimation, is 55 to 60%. The left ventricle has normal function. The left ventricle has no regional wall motion abnormalities. The left ventricular internal cavity size was normal in size. There is  mild concentric left ventricular hypertrophy. Left ventricular diastolic parameters are indeterminate. Right Ventricle: The right ventricular size is mildly enlarged. No increase in right ventricular wall thickness. Right ventricular systolic function is mildly reduced. There is moderately elevated pulmonary artery systolic pressure. The tricuspid regurgitant velocity is 2.89 m/s, and with an assumed right atrial pressure of 15 mmHg, the estimated right ventricular systolic pressure is 99.8 mmHg. Left Atrium: Left atrial size was normal in size. Right Atrium: Right atrial size was normal in size. Pericardium: There is no evidence of pericardial effusion. Presence of epicardial fat layer. Mitral Valve: The mitral valve is normal in structure. No evidence of mitral valve regurgitation. No evidence of mitral valve stenosis. Tricuspid Valve: The tricuspid valve is normal in structure. Tricuspid valve regurgitation is mild to moderate. No evidence of tricuspid stenosis. Aortic Valve: The aortic valve is tricuspid. Aortic valve regurgitation is not visualized. No aortic stenosis is present. Aortic valve peak gradient measures 3.1 mmHg. Pulmonic Valve: The pulmonic valve was normal in structure. Pulmonic valve regurgitation is not visualized. No evidence of pulmonic stenosis. Aorta: The aortic root is normal in size and structure. There is borderline dilatation of the aortic root, measuring 36 mm. There is mild dilatation of the ascending aorta, measuring 38 mm. Venous: The inferior vena cava is  dilated in size with less than 50% respiratory variability, suggesting right atrial pressure of 15 mmHg. IAS/Shunts: No atrial level shunt detected by color flow Doppler.  LEFT VENTRICLE PLAX 2D LVIDd:         3.60 cm   Diastology LVIDs:         2.30 cm   LV e' medial:    5.98 cm/s LV PW:         1.00 cm   LV E/e' medial:  7.6 LV IVS:        1.10 cm   LV e' lateral:   6.53 cm/s LVOT diam:     2.10 cm   LV E/e' lateral: 7.0 LVOT Area:     3.46 cm  IVC IVC diam: 2.80 cm LEFT ATRIUM  Index        RIGHT ATRIUM           Index LA diam:        2.60 cm 1.23 cm/m   RA Area:     11.40 cm LA Vol (A2C):   32.4 ml 15.27 ml/m  RA Volume:   23.30 ml  10.98 ml/m LA Vol (A4C):   24.0 ml 11.31 ml/m LA Biplane Vol: 29.1 ml 13.72 ml/m  AORTIC VALVE AV Vmax:      88.67 cm/s AV Peak Grad: 3.1 mmHg  AORTA Ao Root diam: 3.60 cm Ao Asc diam:  3.80 cm MITRAL VALVE               TRICUSPID VALVE MV Area (PHT): 3.76 cm    TR Peak grad:   33.4 mmHg MV Decel Time: 202 msec    TR Vmax:        289.00 cm/s MV E velocity: 45.60 cm/s MV A velocity: 88.60 cm/s  SHUNTS MV E/A ratio:  0.51        Systemic Diam: 2.10 cm Kardie Tobb DO Electronically signed by Berniece Salines DO Signature Date/Time: 10/06/2021/2:03:08 PM    Final    IR INFUSION THROMBOL VENOUS INITIAL (MS)  Result Date: 10/07/2021 INDICATION: Sub massive bilateral pulmonary emboli.  Elevated creatinine. EXAM: 1. ULTRASOUND GUIDANCE FOR VENOUS ACCESS X2 2. BILATERAL PULMONARY ARTERIOGRAPHY 3. FLUOROSCOPIC GUIDED PLACEMENT OF BILATERAL PULMONARY ARTERIAL LYTIC INFUSION CATHETERS COMPARISON:  CTA from earlier the same day MEDICATIONS: Intravenous Fentanyl 25mg and Versed 1.'5mg'$  were administered as conscious sedation during continuous monitoring of the patient's level of consciousness and physiological / cardiorespiratory status by the radiology RN, with a total moderate sedation time of 32 minutes. CONTRAST:  None required FLUOROSCOPY TIME:  Radiation Exposure Index (as  provided by the fluoroscopic device): 75 mGy air Kerma COMPLICATIONS: None immediate TECHNIQUE: Informed written consent was obtained from the patient after a discussion of the risks, benefits and alternatives to treatment. Questions regarding the procedure were encouraged and answered. A timeout was performed prior to the initiation of the procedure. Ultrasound scanning was performed demonstrating patency of the right internal jugular vein, selected for vascular access. The right neck was prepped and draped in the usual sterile fashion, and a sterile drape was applied covering the operative field. Maximum barrier sterile technique with sterile gowns and gloves were used for the procedure. A timeout was performed prior to the initiation of the procedure. Local anesthesia was provided with 1% lidocaine. Under direct ultrasound guidance, the right internal jugular vein was accessed with a micro puncture sheath ultimately allowing placement of a 6 French vascular sheath. Slightly cranial to this initial access, the right internal jugular vein was again accessed with a micropuncture sheath ultimately allowing placement of a 6 French vascular sheath. With the use of a Bentson wire, an angled pigtail catheter was advanced into the left main pulmonary artery . Pressure measurements were then obtained from the left pulmonary artery. Over an exchange length Rosen wire, the pigtail catheter was exchanged for a 90/10 cm multi side-hole infusion catheter. With the use of a Bentson wire, a pigtail catheter was advanced into the right main pulmonary artery . Over an exchange length Rosen wire, the pigtail catheter was exchanged for a 90/15 cm multi side-hole infusion catheter. A postprocedural fluoroscopic image was obtained of the check demonstrating final catheter positioning. Both vascular sheath were secured at the right neck with 0 silk suture. The external catheter tubing  was secured at the right neck and the lytic therapy  was initiated. The patient tolerated the procedure well without immediate postprocedural complication. FINDINGS: Acquired pressure measurements: Left main pulmonary artery-40/10 mmHg mean 22 (normal: < 25/10) Following the procedure, both infusion catheter tips terminate within the distal aspects of the bilateral lower lobe sub segmental pulmonary arteries. IMPRESSION: 1. Successful fluoroscopic guided initiation of bilateral catheter directed pulmonary arterial lysis for sub massive pulmonary embolism and right-sided heart strain. 2. Elevated pressure measurements within the left main pulmonary artery compatible with pulmonary arterial hypertension. PLAN: -follow-up pulmonary arterial pressure measurements after the 12 hour catheter directed pulmonary arterial lysis Electronically Signed   By: Lucrezia Europe M.D.   On: 10/07/2021 08:34    Labs:  CBC: Recent Labs    10/06/21 0636 10/06/21 1812 10/06/21 2350 10/07/21 0515  WBC 8.4 10.9* 10.1 9.6  HGB 11.8* 11.7* 11.5* 10.4*  HCT 36.5* 37.2* 34.7* 32.0*  PLT 96* 102* 89* 91*    COAGS: Recent Labs    10/05/21 2305  INR 1.2    BMP: Recent Labs    10/05/21 2305 10/05/21 2325 10/06/21 0636 10/07/21 0515  NA 139 140 140 140  K 4.4 5.2* 4.9 3.9  CL 110 110 119* 115*  CO2 20*  --  14* 19*  GLUCOSE 190* 186* 145* 106*  BUN 20 29* 20 22  CALCIUM 8.8*  --  8.2* 8.8*  CREATININE 2.01* 2.00* 1.75* 1.61*  GFRNONAA 34*  --  40* 44*    LIVER FUNCTION TESTS: Recent Labs    10/05/21 2305 10/06/21 0636  BILITOT 0.8 0.5  AST 28 32  ALT 21 23  ALKPHOS 72 71  PROT 6.1* 5.3*  ALBUMIN 3.3* 2.8*    Assessment and Plan:  76 y/o M admitted 10/06/21 due to bilateral PE with right heart strain s/p bilateral pulmonary artery catheter directed thrombolysis in IR seen today for follow up.  Patient reports feeling well, no chest pain/dyspnea. Pre procedure pulmonary artery pressure 42/10 (22), post procedure pulmonary artery pressure 19/4 (9) with  appropriate waveform seen on monitor. Right IJ catheters removed at bedside without complication, hemostasis achieved via manual pressure with quick clot.   Right upper extremity swelling noted from elbow to wrist - per wife not present yesterday before or after procedure, was noted this morning when she came in to visit. Patient reports arm feels a little tight but non-tender, neurovascularly in tact on exam. Peripheral IV in right [facility] working well, non-tender, some bruising surrounding right AC site. This is not consistent with swelling due to presence of right IJ catheters -- recommend continued monitoring by primary team/bedside RN. BP cuff moved to left upper extremity by RN.  IR will follow up tomorrow, further plans per primary team.  Electronically Signed: Joaquim Nam, PA-C 10/07/2021, 10:34 AM   I spent a total of 15 Minutes at the the patient's bedside AND on the patient's hospital floor or unit, greater than 50% of which was counseling/coordinating care for PE lysis follow up.

## 2021-10-07 NOTE — Progress Notes (Signed)
NAME:  Douglas Hoffman, MRN:  [id number], DOB:  1945/05/28, LOS: 1 ADMISSION DATE:  10/05/2021 CONSULTATION DATE:  10/06/2021 REFERRING MD:  Ralene Bathe - EDP, CHIEF COMPLAINT:  Shock secondary to PE   History of Present Illness:  76 year old man who presented to [facility] ED 6/5 with weakness and CP. PMHx significant for HTN, DVT (2015, off of Coumadin since 2020), gout.  Patient states around 8:30 PM on 6/4 he was sitting/watching TV and started to feel weak.  He developed diaphoresis, central chest pain and had an episode of bowel incontinence.  EMS was called; noted patient's initial BP to be 78/40.  Patient was fluid resuscitated and transported to [facility] ED.    Upon arrival to [facility] ED patient's chest pain had resolved.  Patient states he did take his BP meds on the morning of admission. Patient's BP on arrival to ED 87/69.  Placed on 2L [place] for support. Started on low-dose Levophed  and continuous IV fluids.  EKG without ST elevation.  Initial troponin 259.  Pertinent ED labs: Glucose 190, creatinine 2.01, WBC 11, LA 3.5.  Patient started on heparin for NSTEMI.  Initially pended to PCU via [place]; however, bedside Echo demonstrated RV dilatation and reduced EF/preserved LV function, prompting CTA Chest. CTA Chest demonstrated submasive PE with large clot burden of bilateral main pulmonary arteries. IR consulted for assistance with management.  PCCM consulted for further management/ICU transfer.  Pertinent Medical History:   Past Medical History:  Diagnosis Date   Acute venous embolism and thrombosis of unspecified deep vessels of lower extremity    Arthritis    knee    Cancer (HCC)    Clotting disorder (HCC)    DVT many years  ago    Glaucoma    left eye since age 26   Gout    Hip pain    History of radiation therapy    Prostate   Hypertension    Prostate cancer [facility])    Renal disorder    reports prior to joint replacement surgery , was taking 5 to 6 aleve daily for pain releif, report shas stopped this  and kidney function has omporved, kidney fx followed by his PCP      Significant Hospital Events: Including procedures, antibiotic start and stop dates in addition to other pertinent events   6/4 - Presented to [facility] ED for weakness, CP. LA elevated, troponin elevated. Treated for NSTEMI.  Admitted by [facility] to PCU. 6/5 - PCCM consulted for assistance with management. Patient more hypotensive requiring initiation of IV vasopressors (shortly weaned off). Bedside Echo with RV dilatations, reduced F despite preserved LV function. CTA Chest obtained with submassive PE. IR consulted for catheter-directed lytics (bilateral pulmonary arteries).  6/6 Improved hemodynamics s/p catheter placement. R forearm with significant swelling, mild-moderate bruising.  Interim History / Subjective:  No significant events overnight Overall feeling much better Tolerating clears C/o R forearm swelling/bruising, per RN catheter flushes without issue Improved hemodynamics, remains off of pressors Mg/Phos low, repleted by E-Link Awaiting further IR plan Likely stable for transfer out of ICU once catheters removed  Objective:  Blood pressure (!) 154/93, pulse 65, temperature 98.6 F (37 C), temperature source Oral, resp. rate 19, height 6' (1.829 m), weight 89.8 kg, SpO2 99 %.        Intake/Output Summary (Last 24 hours) at 10/07/2021 0732 Last data filed at 10/07/2021 0700 Gross per 24 hour  Intake 1638.62 ml  Output 925 ml  Net 713.62 ml  Filed Weights   10/05/21 2302  Weight: 89.8 kg   Physical Examination: General: Acutely ill-appearing elderly man in NAD. Pleasant and conversant. HEENT: [place]/AT, anicteric sclera, PERRL, moist mucous membranes. Neuro: Awake, oriented x 4. Responds to verbal stimuli. Following commands consistently. Moves all 4 extremities spontaneously. CV: RRR, no m/g/r. PULM: Breathing even and unlabored on RA. Lung fields CTAB. GI: Soft, nontender, nondistended. Normoactive bowel  sounds. Extremities: No LE edema noted. Skin: Warm/dry, no rashes. R forearm with increased swelling, ecchymosis.  Resolved Hospital Problem List:    Assessment & Plan:  Submassive PE of bilateral main pulmonary arteries S/p catheter-directed lytic initiation 6/5 Admitted with weakness, CP. Became hypotensive with bedside Echo showing RV dilatation, reduced EF with preserved LV function. CTA Chest obtained demonstrating large clot burden of bilateral main pulmonary arteries with evidence of R heart strain (RV/LV Ratio 2.5). Management options including conservative management, lytic administration and thrombectomy discussed with decision for catheter-directed thrombolytics. - IR consulted, appreciate assistance with management - S/p catheter-directed thrombolytics, initiated 6/5 - Therapeutic anticoagulation with heparin gtt - Eventual transition to PO anticoagulation once nearing discharge  Shock, likely secondary to hemodynamic instability in the setting of submassive PE; improved s/p IR intervention Mild leukocytosis - Monitoring in ICU - Goal MAP > 65 - Fluid resuscitation as tolerated - No longer requiring IV vasopressors - Troponin stabilized - Trend LA to normal   NSTEMI Elevated troponin c/w NSTEMI in the setting of PE. - Heparin gtt - Cardiac monitoring/telemetry - Troponin stabilized, as above  AKI, likely mild ischemic ATN in the setting of shock/hypotension, improving Baseline Cr 1.5-1.6, 2 on admission now back to baseline. - Trend BMP - Replete electrolytes as indicated - Monitor I&Os - Avoid nephrotoxic agents as able - Ensure adequate renal perfusion  Hyperglycemia - SSI - CBGs Q4H - Goal 140-180  Hx of HTN - Hold home antihypertensives at present, resume as clinically indicated  Likely stable for floor transfer after catheters for lytic therapy removed; will await further IR recommendations.  Best Practice: (right click and "Reselect all SmartList  Selections" daily)   Diet/type: clear liquids, ADAT to regular DVT prophylaxis: systemic heparin GI prophylaxis: N/A Lines: Catheters for catheter-directed thrombolysis, management per IR Foley:  N/A Code Status:  full code Last date of multidisciplinary goals of care discussion [6/5 - Remains Full Code]  Critical care time: 32 minutes   Lestine Mount, PA-C [facility] 10/07/21 7:32 AM  Please see [email or web address] for pager details.  From 7A-7P if no response, please call [phone] After hours, please call ELink [phone]

## 2021-10-07 NOTE — Plan of Care (Signed)

## 2021-10-08 ENCOUNTER — Telehealth: Payer: Self-pay | Admitting: Pulmonary Disease

## 2021-10-08 ENCOUNTER — Encounter (HOSPITAL_COMMUNITY): Payer: Self-pay | Admitting: Radiology

## 2021-10-08 DIAGNOSIS — T148XXA Other injury of unspecified body region, initial encounter: Secondary | ICD-10-CM

## 2021-10-08 DIAGNOSIS — I214 Non-ST elevation (NSTEMI) myocardial infarction: Secondary | ICD-10-CM | POA: Diagnosis not present

## 2021-10-08 LAB — CBC
HCT: 23.1 % — ABNORMAL LOW (ref 39.0–52.0)
Hemoglobin: 7.5 g/dL — ABNORMAL LOW (ref 13.0–17.0)
MCH: 31.4 pg (ref 26.0–34.0)
MCHC: 32.5 g/dL (ref 30.0–36.0)
MCV: 96.7 fL (ref 80.0–100.0)
Platelets: 87 10*3/uL — ABNORMAL LOW (ref 150–400)
RBC: 2.39 MIL/uL — ABNORMAL LOW (ref 4.22–5.81)
RDW: 13.6 % (ref 11.5–15.5)
WBC: 9.2 10*3/uL (ref 4.0–10.5)
nRBC: 0 % (ref 0.0–0.2)

## 2021-10-08 LAB — MAGNESIUM: Magnesium: 2 mg/dL (ref 1.7–2.4)

## 2021-10-08 LAB — GLUCOSE, CAPILLARY
Glucose-Capillary: 102 mg/dL — ABNORMAL HIGH (ref 70–99)
Glucose-Capillary: 103 mg/dL — ABNORMAL HIGH (ref 70–99)
Glucose-Capillary: 106 mg/dL — ABNORMAL HIGH (ref 70–99)
Glucose-Capillary: 121 mg/dL — ABNORMAL HIGH (ref 70–99)
Glucose-Capillary: 128 mg/dL — ABNORMAL HIGH (ref 70–99)
Glucose-Capillary: 135 mg/dL — ABNORMAL HIGH (ref 70–99)

## 2021-10-08 LAB — URINE CULTURE

## 2021-10-08 LAB — BASIC METABOLIC PANEL
Anion gap: 7 (ref 5–15)
BUN: 20 mg/dL (ref 8–23)
CO2: 21 mmol/L — ABNORMAL LOW (ref 22–32)
Calcium: 8.3 mg/dL — ABNORMAL LOW (ref 8.9–10.3)
Chloride: 110 mmol/L (ref 98–111)
Creatinine, Ser: 1.57 mg/dL — ABNORMAL HIGH (ref 0.61–1.24)
GFR, Estimated: 45 mL/min — ABNORMAL LOW (ref >60)
Glucose, Bld: 110 mg/dL — ABNORMAL HIGH (ref 70–99)
Potassium: 3.6 mmol/L (ref 3.5–5.1)
Sodium: 138 mmol/L (ref 135–145)

## 2021-10-08 LAB — HEPARIN LEVEL (UNFRACTIONATED): Heparin Unfractionated: 0.39 IU/mL (ref 0.30–0.70)

## 2021-10-08 MED ORDER — ENOXAPARIN SODIUM 100 MG/ML IJ SOSY
90.0000 mg | PREFILLED_SYRINGE | Freq: Two times a day (BID) | INTRAMUSCULAR | Status: DC
  Administered 2021-10-08 – 2021-10-10 (×4): 90 mg via SUBCUTANEOUS
  Filled 2021-10-08 (×5): qty 0.9

## 2021-10-08 NOTE — Telephone Encounter (Signed)
Please schedule hospital f/u (30 min) with me in next 3-4 weeks. Thanks!

## 2021-10-08 NOTE — Telephone Encounter (Signed)
Patient is scheduled on 10/29/2021 at 11am with Dr. Silas Flood. Appointment reminder mailed to address on file- nothing further needed.

## 2021-10-08 NOTE — Progress Notes (Signed)
[place] for Lovenox (from Heparin) Indication: PE  No Known Allergies  Patient Measurements: Height: 6' (182.9 cm) Weight: (P) 93 kg (205 lb 0.4 oz) IBW/kg (Calculated) : 77.6  Vital Signs: Temp: 98.3 F (36.8 C) (06/07 1109) Temp Source: Oral (06/07 0314) BP: 110/99 (06/07 1109) Pulse Rate: 89 (06/07 1300)  Labs: Recent Labs    10/05/21 2305 10/05/21 2325 10/06/21 0319 10/06/21 0636 10/06/21 1638 10/06/21 1001 10/06/21 1812 10/07/21 0515 10/07/21 0750 10/07/21 1216 10/07/21 1710 10/08/21 0530  HGB 13.0   < >  --  11.8*  --   --    < > 10.4*  --  9.6* 9.5* 7.5*  HCT 40.2   < >  --  36.5*  --   --    < > 32.0*  --  28.8* 28.3* 23.1*  PLT 113*  --   --  96*  --   --    < > 91*  --  86* 100* 87*  LABPROT 15.1  --   --   --   --   --   --   --   --   --   --   --   INR 1.2  --   --   --   --   --   --   --   --   --   --   --   HEPARINUNFRC  --   --   --   --   --  0.46   < > 0.51   < > 0.29* 0.38 0.39  CREATININE 2.01*   < >  --  1.75*  --   --   --  1.61*  --   --   --  1.57*  TROPONINIHS 259*  --  869*  --  806* 810*  --   --   --   --   --   --    < > = values in this interval not displayed.    Estimated Creatinine Clearance: 43.9 mL/min (A) (by C-G formula based on SCr of 1.57 mg/dL (H)).  Assessment: 76 y.o. male with hypotension/weakness and elevated troponin and d-dimer.  Pharmacy consulted to dose IV heparin for ACS vs PE.   Submassive PE - now s/p catheter directed thrombolytics 6/5  Noted patient had some hemoptysis morning of 6/6. Hgb down to 7.5, plts down to 87. Pharmacy consulted to transition to Lovenox d/t extravasation. Patient noted to have bilateral hematomas today.   Goal of Therapy:  Anti-Xa level 0.6-1 units/ml 4hrs after LMWH dose given Monitor platelets by anticoagulation protocol: Yes   Plan:  Discontinue heparin Start Lovenox 90 mg subcutaneously every 12 hours Monitor for new or worsening  signs and symptoms of bleeding F/u plan for transition to DOAC   Thank you for allowing [place] to participate in this patients care. Jens Som, PharmD 10/08/2021 1:34 PM  **Pharmacist phone directory can be found on [email or web address] listed under [place]**

## 2021-10-08 NOTE — Progress Notes (Signed)
ANTICOAGULATION CONSULT NOTE  Pharmacy Consult for Heparin Indication: PE  No Known Allergies  Patient Measurements: Height: 6' (182.9 cm) Weight: (P) 93 kg (205 lb 0.4 oz) IBW/kg (Calculated) : 77.6  Vital Signs: Temp: 98.5 F (36.9 C) (06/07 0803) Temp Source: Oral (06/07 0314) BP: 107/90 (06/07 0803) Pulse Rate: 84 (06/07 0803)  Labs: Recent Labs    10/05/21 2305 10/05/21 2325 10/06/21 0319 10/06/21 0636 10/06/21 1194 10/06/21 1001 10/06/21 1812 10/07/21 0515 10/07/21 0750 10/07/21 1216 10/07/21 1710 10/08/21 0530  HGB 13.0   < >  --  11.8*  --   --    < > 10.4*  --  9.6* 9.5* 7.5*  HCT 40.2   < >  --  36.5*  --   --    < > 32.0*  --  28.8* 28.3* 23.1*  PLT 113*  --   --  96*  --   --    < > 91*  --  86* 100* 87*  LABPROT 15.1  --   --   --   --   --   --   --   --   --   --   --   INR 1.2  --   --   --   --   --   --   --   --   --   --   --   HEPARINUNFRC  --   --   --   --   --  0.46   < > 0.51   < > 0.29* 0.38 0.39  CREATININE 2.01*   < >  --  1.75*  --   --   --  1.61*  --   --   --  1.57*  TROPONINIHS 259*  --  869*  --  806* 810*  --   --   --   --   --   --    < > = values in this interval not displayed.    Estimated Creatinine Clearance: 43.9 mL/min (A) (by C-G formula based on SCr of 1.57 mg/dL (H)).  Assessment: 76 y.o. male with hypotension/weakness and elevated troponin and d-dimer.  Pharmacy consulted to dose IV heparin for ACS vs PE.   Submassive PE - now s/p catheter directed thrombolytics 6/5  Heparin level therapeutic this morning at 0.39 (on heparin 1050 units/hr). Noted patient had some hemoptysis morning of 6/6. Hgb down to 7.5, plts down to 87.   Goal of Therapy:  Heparin level 0.3-0.7 units/ml Monitor platelets by anticoagulation protocol: Yes   Plan:  Heparin drip at 1050 units/hr  Daily heparin level and CBC ordered Monitor for signs/symptoms of bleed F/u plan for transition to DOAC   Thank you for allowing [place] to  participate in this patients care. Jens Som, PharmD 10/08/2021 10:29 AM  **Pharmacist phone directory can be found on [email or web address] listed under [place]**

## 2021-10-08 NOTE — Progress Notes (Signed)
Mobility Specialist Progress Note    10/08/21 1124  Mobility  Activity Ambulated with assistance in hallway  Level of Assistance Standby assist, set-up cues, supervision of patient - no hands on  Assistive Device Other (Comment) (IV pole)  Distance Ambulated (ft) 400 ft  Activity Response Tolerated well  $Mobility charge 1 Mobility   Pre-Mobility: 86 HR, 110/99 BP, 94% SpO2 Post-Mobility: 93 HR, 99% SpO2  Pt received in chair and agreeable. No complaints on walk. Returned to chair with call bell in reach.    Hildred Alamin Mobility Specialist
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
## 2021-10-09 ENCOUNTER — Inpatient Hospital Stay (HOSPITAL_COMMUNITY): Payer: PPO

## 2021-10-09 DIAGNOSIS — I214 Non-ST elevation (NSTEMI) myocardial infarction: Secondary | ICD-10-CM | POA: Diagnosis not present

## 2021-10-09 DIAGNOSIS — I2609 Other pulmonary embolism with acute cor pulmonale: Secondary | ICD-10-CM

## 2021-10-09 DIAGNOSIS — I248 Other forms of acute ischemic heart disease: Secondary | ICD-10-CM | POA: Diagnosis not present

## 2021-10-09 DIAGNOSIS — I2699 Other pulmonary embolism without acute cor pulmonale: Secondary | ICD-10-CM | POA: Diagnosis not present

## 2021-10-09 LAB — CBC
HCT: 22.9 % — ABNORMAL LOW (ref 39.0–52.0)
HCT: 23.1 % — ABNORMAL LOW (ref 39.0–52.0)
Hemoglobin: 7.3 g/dL — ABNORMAL LOW (ref 13.0–17.0)
Hemoglobin: 7.9 g/dL — ABNORMAL LOW (ref 13.0–17.0)
MCH: 30.7 pg (ref 26.0–34.0)
MCH: 31.2 pg (ref 26.0–34.0)
MCHC: 31.9 g/dL (ref 30.0–36.0)
MCHC: 34.2 g/dL (ref 30.0–36.0)
MCV: 96.2 fL (ref 80.0–100.0)
MCV: 91.3 fL (ref 80.0–100.0)
Platelets: 94 10*3/uL — ABNORMAL LOW (ref 150–400)
Platelets: 86 10*3/uL — ABNORMAL LOW (ref 150–400)
RBC: 2.38 MIL/uL — ABNORMAL LOW (ref 4.22–5.81)
RBC: 2.53 MIL/uL — ABNORMAL LOW (ref 4.22–5.81)
RDW: 13.6 % (ref 11.5–15.5)
RDW: 14.5 % (ref 11.5–15.5)
WBC: 9 10*3/uL (ref 4.0–10.5)
WBC: 7.6 10*3/uL (ref 4.0–10.5)
nRBC: 0 % (ref 0.0–0.2)
nRBC: 0 % (ref 0.0–0.2)

## 2021-10-09 LAB — GLUCOSE, CAPILLARY
Glucose-Capillary: 118 mg/dL — ABNORMAL HIGH (ref 70–99)
Glucose-Capillary: 109 mg/dL — ABNORMAL HIGH (ref 70–99)
Glucose-Capillary: 93 mg/dL (ref 70–99)
Glucose-Capillary: 114 mg/dL — ABNORMAL HIGH (ref 70–99)
Glucose-Capillary: 120 mg/dL — ABNORMAL HIGH (ref 70–99)
Glucose-Capillary: 139 mg/dL — ABNORMAL HIGH (ref 70–99)
Glucose-Capillary: 113 mg/dL — ABNORMAL HIGH (ref 70–99)

## 2021-10-09 LAB — COMPREHENSIVE METABOLIC PANEL
ALT: 19 U/L (ref 0–44)
AST: 20 U/L (ref 15–41)
Albumin: 2.6 g/dL — ABNORMAL LOW (ref 3.5–5.0)
Alkaline Phosphatase: 59 U/L (ref 38–126)
Anion gap: 5 (ref 5–15)
BUN: 21 mg/dL (ref 8–23)
CO2: 20 mmol/L — ABNORMAL LOW (ref 22–32)
Calcium: 8.6 mg/dL — ABNORMAL LOW (ref 8.9–10.3)
Chloride: 115 mmol/L — ABNORMAL HIGH (ref 98–111)
Creatinine, Ser: 1.59 mg/dL — ABNORMAL HIGH (ref 0.61–1.24)
GFR, Estimated: 45 mL/min — ABNORMAL LOW (ref >60)
Glucose, Bld: 100 mg/dL — ABNORMAL HIGH (ref 70–99)
Potassium: 3.9 mmol/L (ref 3.5–5.1)
Sodium: 140 mmol/L (ref 135–145)
Total Bilirubin: 0.8 mg/dL (ref 0.3–1.2)
Total Protein: 5 g/dL — ABNORMAL LOW (ref 6.5–8.1)

## 2021-10-09 LAB — PREPARE RBC (CROSSMATCH)

## 2021-10-09 MED ORDER — DIPHENHYDRAMINE HCL 25 MG PO CAPS
25.0000 mg | ORAL_CAPSULE | Freq: Once | ORAL | Status: AC
  Administered 2021-10-09: 25 mg via ORAL
  Filled 2021-10-09: qty 1

## 2021-10-09 MED ORDER — FUROSEMIDE 10 MG/ML IJ SOLN
20.0000 mg | Freq: Once | INTRAMUSCULAR | Status: AC
  Administered 2021-10-09: 20 mg via INTRAVENOUS
  Filled 2021-10-09: qty 2

## 2021-10-09 MED ORDER — SODIUM CHLORIDE 0.9% IV SOLUTION: Freq: Once | INTRAVENOUS | Status: AC

## 2021-10-09 MED ORDER — PERFLUTREN LIPID MICROSPHERE
1.0000 mL | INTRAVENOUS | Status: AC | PRN
  Administered 2021-10-09: 2 mL via INTRAVENOUS

## 2021-10-09 MED ORDER — ACETAMINOPHEN 325 MG PO TABS
650.0000 mg | ORAL_TABLET | Freq: Once | ORAL | Status: AC
Start: 2021-10-09 — End: 2021-10-09
  Administered 2021-10-09: 650 mg via ORAL
  Filled 2021-10-09: qty 2

## 2021-10-09 MED ORDER — PROCHLORPERAZINE EDISYLATE 10 MG/2ML IJ SOLN
10.0000 mg | INTRAMUSCULAR | Status: DC | PRN
  Administered 2021-10-09: 10 mg via INTRAVENOUS
  Filled 2021-10-09: qty 2
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
## 2021-10-09 NOTE — Progress Notes (Signed)
Progress Note  Douglas Name: Douglas Hoffman Date of Encounter: 10/09/2021  [facility] Cardiologist: None   Subjective   Cr 1.59, Hgb 7.3. BP 143/76. Denies chest pain or dyspnea  Inpatient Medications    Scheduled Meds:  sodium chloride   Intravenous Once   acetaminophen  650 mg Oral Once   aspirin EC  81 mg Oral Daily   atorvastatin  80 mg Oral Daily   diphenhydrAMINE  25 mg Oral Once   enoxaparin (LOVENOX) injection  90 mg Subcutaneous Q12H   insulin aspart  0-9 Units Subcutaneous Q4H   sodium chloride flush  3 mL Intravenous Q12H   Continuous Infusions:  sodium chloride 20.8 mL/hr at 10/07/21 1000   sodium chloride 20.8 mL/hr at 10/07/21 1000   sodium chloride 20 mL/hr at 10/07/21 1000   sodium chloride 20 mL/hr at 10/07/21 1000   PRN Meds: sodium chloride, sodium chloride, acetaminophen, chlorpheniramine-HYDROcodone, docusate sodium, melatonin, polyethylene glycol, prochlorperazine, sodium chloride flush   Vital Signs    Vitals:   10/08/21 1924 10/08/21 2335 10/09/21 0309 10/09/21 0313  BP: 98/72 131/81 (!) 143/76   Pulse: 92  94   Resp: '20 15 14   '$ Temp: 99 F (37.2 C) 98.4 F (36.9 C) 98.7 F (37.1 C)   TempSrc: Oral Oral Oral   SpO2: 95% 94% 98%   Weight:    92 kg  Height:        Intake/Output Summary (Last 24 hours) at 10/09/2021 0956 Last data filed at 10/09/2021 0200 Gross per 24 hour  Intake 480 ml  Output 850 ml  Net -370 ml       10/09/2021    3:13 AM 10/08/2021    4:29 AM 10/05/2021   11:02 PM  Last 3 Weights  Weight (lbs) 202 lb 13.2 oz 205 lb 0.4 oz 198 lb  Weight (kg) 92 kg 93 kg 89.812 kg      Telemetry    Normal sinus rhythm- Personally Reviewed  ECG    No new ECG- Personally Reviewed  Physical Exam   GEN: No acute distress.   Neck: no JVD Cardiac: RRR, no murmurs Respiratory: CTAB GI: Soft, nontender,  MS: No edema.   Skin: Left side chest hematoma Neuro:  Nonfocal  Psych: Normal affect   Labs    High Sensitivity  Troponin:   Recent Labs  Lab 10/05/21 2305 10/06/21 0319 10/06/21 0638 10/06/21 1001  TROPONINIHS 259* 869* 806* 810*      Chemistry Recent Labs  Lab 10/05/21 2305 10/05/21 2325 10/06/21 0636 10/07/21 0515 10/08/21 0530 10/09/21 0132  NA 139   < > 140 140 138 140  K 4.4   < > 4.9 3.9 3.6 3.9  CL 110   < > 119* 115* 110 115*  CO2 20*  --  14* 19* 21* 20*  GLUCOSE 190*   < > 145* 106* 110* 100*  BUN 20   < > '20 22 20 21  '$ CREATININE 2.01*   < > 1.75* 1.61* 1.57* 1.59*  CALCIUM 8.8*  --  8.2* 8.8* 8.3* 8.6*  MG 1.9  --  1.9 1.8 2.0  --   PROT 6.1*  --  5.3*  --   --  5.0*  ALBUMIN 3.3*  --  2.8*  --   --  2.6*  AST 28  --  32  --   --  20  ALT 21  --  23  --   --  19  ALKPHOS  72  --  71  --   --  59  BILITOT 0.8  --  0.5  --   --  0.8  GFRNONAA 34*  --  40* 44* 45* 45*  ANIONGAP 9  --  '7 6 7 5   '$ < > = values in this interval not displayed.     Lipids  Recent Labs  Lab 10/06/21 0637  CHOL 163  TRIG 21  HDL 34*  LDLCALC 125*  CHOLHDL 4.8     Hematology Recent Labs  Lab 10/07/21 1710 10/08/21 0530 10/09/21 0132  WBC 11.2* 9.2 9.0  RBC 2.95* 2.39* 2.38*  HGB 9.5* 7.5* 7.3*  HCT 28.3* 23.1* 22.9*  MCV 95.9 96.7 96.2  MCH 32.2 31.4 30.7  MCHC 33.6 32.5 31.9  RDW 13.6 13.6 13.6  PLT 100* 87* 94*    Thyroid  Recent Labs  Lab 10/06/21 0637  TSH 0.601     BNP Recent Labs  Lab 10/06/21 0638  BNP 71.7     DDimer  Recent Labs  Lab 10/05/21 2305  DDIMER >20.00*      Radiology    IR THROMB F/U EVAL ART/VEN FINAL DAY (MS)  Result Date: 10/07/2021 INDICATION: 76 year old male status post initiation of bilateral pulmonary artery thrombolysis on 10/06/2021 at approximately 17:00. EXAM: IR THROMB F/U EVAL ART/VEN FINAL DAY COMPARISON:  10/06/2021 MEDICATIONS: None. ANESTHESIA/SEDATION: None. FLUOROSCOPY TIME:  None. COMPLICATIONS: None immediate. TECHNIQUE: Pressure transducer was affixed to 1 of the indwelling pulmonary lysis catheters and  manometry was obtained. The catheters and sheath were then removed without complication. Hemostasis was achieved with brief manual compression. FINDINGS: Pulmonary artery pressure equals 19/4, mean 9 mm Hg. IMPRESSION: Significant improvement in symptoms and resolution of pulmonary hypertension. Indwelling pulmonary artery lysis catheters were then removed without complication. Ruthann Cancer, MD [facility] Radiology Electronically Signed   By: Ruthann Cancer M.D.   On: 10/07/2021 17:31    Cardiac Studies   Echo 10/06/21:  1. Left ventricular ejection fraction, by estimation, is 55 to 60%. The  left ventricle has normal function. The left ventricle has no regional  wall motion abnormalities. There is mild concentric left ventricular  hypertrophy. Left ventricular diastolic  parameters are indeterminate.   2. Right ventricular systolic function is mildly reduced. The right  ventricular size is mildly enlarged. There is moderately elevated  pulmonary artery systolic pressure.   3. The mitral valve is normal in structure. No evidence of mitral valve  regurgitation. No evidence of mitral stenosis.   4. Tricuspid valve regurgitation is mild to moderate.   5. The aortic valve is tricuspid. Aortic valve regurgitation is not  visualized. No aortic stenosis is present.   6. There is borderline dilatation of the aortic root, measuring 36 mm.  There is mild dilatation of the ascending aorta, measuring 38 mm.   7. The inferior vena cava is dilated in size with <50% respiratory  variability, suggesting right atrial pressure of 15 mmHg.   Douglas Hoffman     76 y.o. male with a hx of hypertension, DVT, prostate cancer, CKD who is being seen 10/06/2021 for the evaluation of chest pain   Assessment & Plan    Shock: secondary to submassive PE.  Status post catheter directed thrombolysis improved, now normotensive off pressors  Submassive PE: Underwent catheter  directed thrombolytics 6/5.  On lovenox -Severe RV dysfunction by my read on initial echo.  Will check limited echo to monitor for improvement  s/p catheter directed lytics  Elevated troponin: Presented with chest pain and troponin elevation of 869.  Suspect demand ischemia/RV strain in setting of submassive PE.  Echo shows normal LV systolic function, severe RV dysfunction by my read.  No further cardiac work-up recommended at this time  AKI: Creatinine 2 on admission, likely due to shock.  Baseline creatinine 1.4-1.7, is now back to baseline   For questions or updates, please contact [place] Please consult [email or web address] for contact info under        Signed, Donato Heinz, MD  10/09/2021, 9:56 AM

## 2021-10-09 NOTE — Progress Notes (Signed)
  Echocardiogram 2D Echocardiogram has been performed.  Douglas Hoffman 10/09/2021, 3:04 PM

## 2021-10-09 NOTE — Progress Notes (Signed)
Mobility Specialist: Progress Note   10/09/21 0954  Mobility  Activity Ambulated independently in hallway  Level of Assistance Independent  Assistive Device None  Distance Ambulated (ft) 800 ft  Activity Response Tolerated well  $Mobility charge 1 Mobility   During Mobility: 126 HR  Received pt in bed having no complaints and agreeable to mobility. Asymptomatic throughout ambulation. Pt up at the sink to brush his teeth, all needs met.   [facility] Tristine Langi Mobility Specialist Mobility Specialist [address]: [phone]

## 2021-10-09 NOTE — Care Management Important Message (Signed)
Important Message  Patient Details  Name: Douglas Hoffman MRN: [id number] Date of Birth: 12-Jan-1946   Medicare Important Message Given:  Yes     Orbie Pyo 10/09/2021, 4:27 PM

## 2021-10-10 DIAGNOSIS — I2699 Other pulmonary embolism without acute cor pulmonale: Secondary | ICD-10-CM | POA: Diagnosis not present

## 2021-10-10 DIAGNOSIS — I248 Other forms of acute ischemic heart disease: Secondary | ICD-10-CM | POA: Diagnosis not present

## 2021-10-10 DIAGNOSIS — I214 Non-ST elevation (NSTEMI) myocardial infarction: Secondary | ICD-10-CM | POA: Diagnosis not present

## 2021-10-10 LAB — CBC WITH DIFFERENTIAL/PLATELET
Abs Immature Granulocytes: 0.35 10*3/uL — ABNORMAL HIGH (ref 0.00–0.07)
Basophils Absolute: 0.1 10*3/uL (ref 0.0–0.1)
Basophils Relative: 1 %
Eosinophils Absolute: 0.4 10*3/uL (ref 0.0–0.5)
Eosinophils Relative: 4 %
HCT: 27.1 % — ABNORMAL LOW (ref 39.0–52.0)
Hemoglobin: 9 g/dL — ABNORMAL LOW (ref 13.0–17.0)
Immature Granulocytes: 4 %
Lymphocytes Relative: 19 %
Lymphs Abs: 1.8 10*3/uL (ref 0.7–4.0)
MCH: 30.9 pg (ref 26.0–34.0)
MCHC: 33.2 g/dL (ref 30.0–36.0)
MCV: 93.1 fL (ref 80.0–100.0)
Monocytes Absolute: 1.1 10*3/uL — ABNORMAL HIGH (ref 0.1–1.0)
Monocytes Relative: 12 %
Neutro Abs: 5.6 10*3/uL (ref 1.7–7.7)
Neutrophils Relative %: 60 %
Platelets: 122 10*3/uL — ABNORMAL LOW (ref 150–400)
RBC: 2.91 MIL/uL — ABNORMAL LOW (ref 4.22–5.81)
RDW: 15.5 % (ref 11.5–15.5)
WBC: 9.2 10*3/uL (ref 4.0–10.5)
nRBC: 0 % (ref 0.0–0.2)

## 2021-10-10 LAB — COMPREHENSIVE METABOLIC PANEL
ALT: 23 U/L (ref 0–44)
AST: 31 U/L (ref 15–41)
Albumin: 2.8 g/dL — ABNORMAL LOW (ref 3.5–5.0)
Alkaline Phosphatase: 62 U/L (ref 38–126)
Anion gap: 6 (ref 5–15)
BUN: 20 mg/dL (ref 8–23)
CO2: 19 mmol/L — ABNORMAL LOW (ref 22–32)
Calcium: 8.7 mg/dL — ABNORMAL LOW (ref 8.9–10.3)
Chloride: 115 mmol/L — ABNORMAL HIGH (ref 98–111)
Creatinine, Ser: 1.59 mg/dL — ABNORMAL HIGH (ref 0.61–1.24)
GFR, Estimated: 45 mL/min — ABNORMAL LOW (ref >60)
Glucose, Bld: 96 mg/dL (ref 70–99)
Potassium: 3.9 mmol/L (ref 3.5–5.1)
Sodium: 140 mmol/L (ref 135–145)
Total Bilirubin: 1.1 mg/dL (ref 0.3–1.2)
Total Protein: 5.3 g/dL — ABNORMAL LOW (ref 6.5–8.1)

## 2021-10-10 LAB — ECHOCARDIOGRAM LIMITED
Calc EF: 68.1 %
Height: 72 in
Single Plane A2C EF: 63.5 %
Single Plane A4C EF: 69 %
Weight: 3245.17 oz

## 2021-10-10 LAB — GLUCOSE, CAPILLARY
Glucose-Capillary: 93 mg/dL (ref 70–99)
Glucose-Capillary: 81 mg/dL (ref 70–99)
Glucose-Capillary: 103 mg/dL — ABNORMAL HIGH (ref 70–99)
Glucose-Capillary: 120 mg/dL — ABNORMAL HIGH (ref 70–99)
Glucose-Capillary: 156 mg/dL — ABNORMAL HIGH (ref 70–99)
Glucose-Capillary: 138 mg/dL — ABNORMAL HIGH (ref 70–99)

## 2021-10-10 LAB — TYPE AND SCREEN
ABO/RH(D): A POS
Antibody Screen: NEGATIVE
Unit division: 0

## 2021-10-10 LAB — BPAM RBC
Blood Product Expiration Date: 202306152359
ISSUE DATE / TIME: 202306081015
Unit Type and Rh: 6200

## 2021-10-10 MED ORDER — APIXABAN 5 MG PO TABS: 5.0000 mg | ORAL_TABLET | Freq: Two times a day (BID) | ORAL | Status: DC

## 2021-10-10 MED ORDER — APIXABAN 5 MG PO TABS
10.0000 mg | ORAL_TABLET | Freq: Two times a day (BID) | ORAL | Status: DC
  Administered 2021-10-10 – 2021-10-11 (×2): 10 mg via ORAL
  Filled 2021-10-10 (×2): qty 2

## 2021-10-10 NOTE — Progress Notes (Signed)
Progress Note  Patient Name: Douglas Hoffman Date of Encounter: 10/10/2021  [facility] Cardiologist: None   Subjective   Cr 1.59, Hgb 9.0. BP 122/72. Denies chest pain or dyspnea  Inpatient Medications    Scheduled Meds:  atorvastatin  80 mg Oral Daily   insulin aspart  0-9 Units Subcutaneous Q4H   sodium chloride flush  3 mL Intravenous Q12H   Continuous Infusions:  sodium chloride 20.8 mL/hr at 10/07/21 1000   sodium chloride 20.8 mL/hr at 10/07/21 1000   sodium chloride 20 mL/hr at 10/07/21 1000   sodium chloride 20 mL/hr at 10/07/21 1000   PRN Meds: sodium chloride, sodium chloride, acetaminophen, melatonin, prochlorperazine, sodium chloride flush   Vital Signs    Vitals:   10/09/21 1934 10/09/21 2310 10/10/21 0427 10/10/21 0751  BP: (!) 142/79 (!) 100/57 122/71 122/72  Pulse: 76 72 74 86  Resp: '16 16 19 16  '$ Temp: 98.3 F (36.8 C) 99.3 F (37.4 C) 98.6 F (37 C) 98.8 F (37.1 C)  TempSrc: Oral Oral Oral Oral  SpO2: 96% 99% 96% 92%  Weight:   90.8 kg   Height:        Intake/Output Summary (Last 24 hours) at 10/10/2021 1145 Last data filed at 10/10/2021 1000 Gross per 24 hour  Intake 724 ml  Output 1300 ml  Net -576 ml       10/10/2021    4:27 AM 10/09/2021    3:13 AM 10/08/2021    4:29 AM  Last 3 Weights  Weight (lbs) 200 lb 2.8 oz 202 lb 13.2 oz 205 lb 0.4 oz  Weight (kg) 90.8 kg 92 kg 93 kg      Telemetry    Normal sinus rhythm- Personally Reviewed  ECG    No new ECG- Personally Reviewed  Physical Exam   GEN: No acute distress.   Neck: no JVD Cardiac: RRR, no murmurs Respiratory: CTAB GI: Soft, nontender,  MS: No edema.   Skin: Left side chest hematoma Neuro:  Nonfocal  Psych: Normal affect   Labs    High Sensitivity Troponin:   Recent Labs  Lab 10/05/21 2305 10/06/21 0319 10/06/21 0638 10/06/21 1001  TROPONINIHS 259* 869* 806* 810*      Chemistry Recent Labs  Lab 10/06/21 0636 10/07/21 0515 10/08/21 0530 10/09/21 0132  10/10/21 0202  NA 140 140 138 140 140  K 4.9 3.9 3.6 3.9 3.9  CL 119* 115* 110 115* 115*  CO2 14* 19* 21* 20* 19*  GLUCOSE 145* 106* 110* 100* 96  BUN '20 22 20 21 20  '$ CREATININE 1.75* 1.61* 1.57* 1.59* 1.59*  CALCIUM 8.2* 8.8* 8.3* 8.6* 8.7*  MG 1.9 1.8 2.0  --   --   PROT 5.3*  --   --  5.0* 5.3*  ALBUMIN 2.8*  --   --  2.6* 2.8*  AST 32  --   --  20 31  ALT 23  --   --  19 23  ALKPHOS 71  --   --  59 62  BILITOT 0.5  --   --  0.8 1.1  GFRNONAA 40* 44* 45* 45* 45*  ANIONGAP '7 6 7 5 6     '$ Lipids  Recent Labs  Lab 10/06/21 0637  CHOL 163  TRIG 21  HDL 34*  LDLCALC 125*  CHOLHDL 4.8     Hematology Recent Labs  Lab 10/09/21 0132 10/09/21 1523 10/10/21 0202  WBC 9.0 7.6 9.2  RBC 2.38* 2.53* 2.91*  HGB 7.3* 7.9* 9.0*  HCT 22.9* 23.1* 27.1*  MCV 96.2 91.3 93.1  MCH 30.7 31.2 30.9  MCHC 31.9 34.2 33.2  RDW 13.6 14.5 15.5  PLT 94* 86* 122*    Thyroid  Recent Labs  Lab 10/06/21 0637  TSH 0.601     BNP Recent Labs  Lab 10/06/21 0638  BNP 71.7     DDimer  Recent Labs  Lab 10/05/21 2305  DDIMER >20.00*      Radiology    No results found.  Cardiac Studies   Echo 10/06/21:  1. Left ventricular ejection fraction, by estimation, is 55 to 60%. The  left ventricle has normal function. The left ventricle has no regional  wall motion abnormalities. There is mild concentric left ventricular  hypertrophy. Left ventricular diastolic  parameters are indeterminate.   2. Right ventricular systolic function is mildly reduced. The right  ventricular size is mildly enlarged. There is moderately elevated  pulmonary artery systolic pressure.   3. The mitral valve is normal in structure. No evidence of mitral valve  regurgitation. No evidence of mitral stenosis.   4. Tricuspid valve regurgitation is mild to moderate.   5. The aortic valve is tricuspid. Aortic valve regurgitation is not  visualized. No aortic stenosis is present.   6. There is borderline  dilatation of the aortic root, measuring 36 mm.  There is mild dilatation of the ascending aorta, measuring 38 mm.   7. The inferior vena cava is dilated in size with <50% respiratory  variability, suggesting right atrial pressure of 15 mmHg.   Patient Profile     76 y.o. male with a hx of hypertension, DVT, prostate cancer, CKD who is being seen 10/06/2021 for the evaluation of chest pain   Assessment & Plan    Shock: secondary to submassive PE.  Status post catheter directed thrombolysis improved, now normotensive off pressors  Submassive PE: Underwent catheter directed thrombolytics 6/5.  On lovenox -Severe RV dysfunction by my read on initial echo.  Appears significantly improved on echo 6/8, now normal function  Elevated troponin: Presented with chest pain and troponin elevation of 869.  Suspect demand ischemia/RV strain in setting of submassive PE.  Echo shows normal LV systolic function, severe RV dysfunction by my read.  No further cardiac work-up recommended at this time  AKI: Creatinine 2 on admission, likely due to shock.  Baseline creatinine 1.4-1.7, is now back to baseline  [facility] will sign off.   Medication Recommendations:  No changes Other recommendations (labs, testing, etc):  None Follow up as an outpatient:  As needed    For questions or updates, please contact [place] [facility] Please consult [email or web address] for contact info under        Signed, Donato Heinz, MD  10/10/2021, 11:45 AM

## 2021-10-10 NOTE — Progress Notes (Signed)
[facility] for transition from lovenox to apixaban Indication: PE  No Known Allergies  Patient Measurements: Height: 6' (182.9 cm) Weight: 90.8 kg (200 lb 2.8 oz) IBW/kg (Calculated) : 77.6  Vital Signs: Temp: 98.9 F (37.2 C) (06/09 1210) Temp Source: Oral (06/09 1210) BP: 145/95 (06/09 1210) Pulse Rate: 75 (06/09 1210)  Labs: Recent Labs    10/07/21 1216 10/07/21 1710 10/08/21 0530 10/09/21 0132 10/09/21 1523 10/10/21 0202  HGB 9.6* 9.5* 7.5* 7.3* 7.9* 9.0*  HCT 28.8* 28.3* 23.1* 22.9* 23.1* 27.1*  PLT 86* 100* 87* 94* 86* 122*  HEPARINUNFRC 0.29* 0.38 0.39  --   --   --   CREATININE  --   --  1.57* 1.59*  --  1.59*    Estimated Creatinine Clearance: 43.4 mL/min (A) (by C-G formula based on SCr of 1.59 mg/dL (H)).  Assessment: 76 y.o. male with hypotension/weakness and elevated troponin and d-dimer.  Pharmacy was consulted to dose IV heparin for ACS vs PE.   Submassive PE - now s/p catheter directed thrombolytics 6/5  Noted patient had some hemoptysis morning of 6/6. Hgb down to 7.5, plts down to 87. Pharmacy consulted to transition to Lovenox d/t extravasation. 6/9- pharmacy consulted to transition form lovenox to apixaban for PE Last dose of lovenox administered 10/10/2021 AM dose   Goal of Therapy:  Monitor platelets by anticoagulation protocol: Yes   Plan:  Discontinue lovenox after last dose administered (10/10/2021 AM dose) Start apixaban 10 mg po bid x7 days on 10/10/2021 at 2200 On 10/17/2021 in the PM, change dose to 5 mg po bid thereafter Monitor for new or worsening signs and symptoms of bleeding   Thank you for allowing [place] to participate in this patients care.  Vaughan Basta BS, PharmD, BCPS Clinical Pharmacist 10/10/2021 12:18 PM  Contact: [phone] after 3 PM  "Be curious, not judgmental..." -Jamal Maes
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
## 2021-10-10 NOTE — Progress Notes (Signed)
Mobility Specialist Progress Note    10/10/21 1456  Mobility  Activity Ambulated independently in hallway  Level of Assistance Standby assist, set-up cues, supervision of patient - no hands on  Assistive Device None  Distance Ambulated (ft) 800 ft  Activity Response Tolerated well  $Mobility charge 1 Mobility   Pre-Mobility: 86 HR  Pt received in bed and agreeable. No complaints on walk. Returned to bed with call bell in reach.    Hildred Alamin Mobility Specialist

## 2021-10-10 NOTE — Discharge Instructions (Addendum)
Information on my medicine - ELIQUIS (apixaban)  This medication education was reviewed with me or my healthcare representative as part of my discharge preparation.  Why was Eliquis prescribed for you? Eliquis was prescribed to treat blood clots that may have been found in the veins of your legs (deep vein thrombosis) or in your lungs (pulmonary embolism) and to reduce the risk of them occurring again.  What do You need to know about Eliquis ? The starting dose is 10 mg (two 5 mg tablets) taken TWICE daily for the FIRST SEVEN (7) DAYS, then on 10/17/2021 in the evening the dose is reduced to ONE 5 mg tablet taken TWICE daily.  Eliquis may be taken with or without food.   Try to take the dose about the same time in the morning and in the evening. If you have difficulty swallowing the tablet whole please discuss with your pharmacist how to take the medication safely.  Take Eliquis exactly as prescribed and DO NOT stop taking Eliquis without talking to the doctor who prescribed the medication.  Stopping may increase your risk of developing a new blood clot.  Refill your prescription before you run out.  After discharge, you should have regular check-up appointments with your healthcare provider that is prescribing your Eliquis.    What do you do if you miss a dose? If a dose of ELIQUIS is not taken at the scheduled time, take it as soon as possible on the same day and twice-daily administration should be resumed. The dose should not be doubled to make up for a missed dose.  Important Safety Information A possible side effect of Eliquis is bleeding. You should call your healthcare provider right away if you experience any of the following: Bleeding from an injury or your nose that does not stop. Unusual colored urine (red or dark brown) or unusual colored stools (red or black). Unusual bruising for unknown reasons. A serious fall or if you hit your head (even if there is no  bleeding).  Some medicines may interact with Eliquis and might increase your risk of bleeding or clotting while on Eliquis. To help avoid this, consult your healthcare provider or pharmacist prior to using any new prescription or non-prescription medications, including herbals, vitamins, non-steroidal anti-inflammatory drugs (NSAIDs) and supplements.  This website has more information on Eliquis (apixaban): http://www.eliquis.com/eliquis/home

## 2021-10-10 NOTE — Plan of Care (Signed)

## 2021-10-10 NOTE — TOC Progression Note (Addendum)
Transition of Care [facility]) - Progression Note    Patient Details  Name: Douglas Hoffman MRN: [id number] Date of Birth: 06-Apr-1946  Transition of Care [facility]) CM/SW [place], RN Phone Number:[phone]  10/10/2021, 3:26 PM  Clinical Narrative:    TOC following patient admitted from home with chest discomfort. Transition of Care [facility]) Screening Note   Patient Details  Name: Douglas Hoffman Date of Birth: December 21, 1945   Transition of Care [facility]) CM/SW Contact:    Angelita Ingles, RN Phone Number: 10/10/2021, 3:29 PM    Transition of Care Department [facility]) has reviewed patient and no TOC needs have been identified at this time. We will continue to monitor patient advancement through interdisciplinary progression rounds.          Expected Discharge Plan and Services                                                 Social Determinants of Health (SDOH) Interventions    Readmission Risk Interventions     No data to display

## 2021-10-10 NOTE — Plan of Care (Signed)
  Problem: Coping: Goal: Ability to adjust to condition or change in health will improve Outcome: Progressing   Problem: Education: Goal: Knowledge of General Education information will improve Description: Including pain rating scale, medication(s)/side effects and non-pharmacologic comfort measures Outcome: Progressing   Problem: Health Behavior/Discharge Planning: Goal: Ability to manage health-related needs will improve Outcome: Progressing   Problem: Clinical Measurements: Goal: Ability to maintain clinical measurements within normal limits will improve Outcome: Progressing Goal: Will remain free from infection Outcome: Progressing Goal: Diagnostic test results will improve Outcome: Progressing   Problem: Activity: Goal: Risk for activity intolerance will decrease Outcome: Progressing   Problem: Coping: Goal: Level of anxiety will decrease Outcome: Progressing   Problem: Elimination: Goal: Will not experience complications related to urinary retention Outcome: Progressing   Problem: Pain Managment: Goal: General experience of comfort will improve Outcome: Progressing

## 2021-10-10 NOTE — Progress Notes (Signed)
Mobility Specialist Progress Note    10/10/21 0954  Mobility  Activity Ambulated independently in hallway  Level of Assistance Standby assist, set-up cues, supervision of patient - no hands on  Assistive Device None  Distance Ambulated (ft) 800 ft  Activity Response Tolerated well  $Mobility charge 1 Mobility   Post-Mobility: 107 HR  Pt received in doorway and agreeable. No complaints on walk. Returned to room with wife and RN present.   Hildred Alamin Mobility Specialist

## 2021-10-11 ENCOUNTER — Encounter: Payer: Self-pay | Admitting: Family Medicine

## 2021-10-11 DIAGNOSIS — I214 Non-ST elevation (NSTEMI) myocardial infarction: Secondary | ICD-10-CM | POA: Diagnosis not present

## 2021-10-11 LAB — CBC WITH DIFFERENTIAL/PLATELET
Abs Immature Granulocytes: 0.27 10*3/uL — ABNORMAL HIGH (ref 0.00–0.07)
Basophils Absolute: 0 10*3/uL (ref 0.0–0.1)
Basophils Relative: 1 %
Eosinophils Absolute: 0.4 10*3/uL (ref 0.0–0.5)
Eosinophils Relative: 6 %
HCT: 27.2 % — ABNORMAL LOW (ref 39.0–52.0)
Hemoglobin: 9.5 g/dL — ABNORMAL LOW (ref 13.0–17.0)
Immature Granulocytes: 4 %
Lymphocytes Relative: 15 %
Lymphs Abs: 1.1 10*3/uL (ref 0.7–4.0)
MCH: 32 pg (ref 26.0–34.0)
MCHC: 34.9 g/dL (ref 30.0–36.0)
MCV: 91.6 fL (ref 80.0–100.0)
Monocytes Absolute: 0.8 10*3/uL (ref 0.1–1.0)
Monocytes Relative: 12 %
Neutro Abs: 4.4 10*3/uL (ref 1.7–7.7)
Neutrophils Relative %: 62 %
Platelets: 158 10*3/uL (ref 150–400)
RBC: 2.97 MIL/uL — ABNORMAL LOW (ref 4.22–5.81)
RDW: 15.3 % (ref 11.5–15.5)
WBC: 7 10*3/uL (ref 4.0–10.5)
nRBC: 0 % (ref 0.0–0.2)

## 2021-10-11 LAB — CULTURE, BLOOD (ROUTINE X 2)
Culture: NO GROWTH
Culture: NO GROWTH
Special Requests: ADEQUATE

## 2021-10-11 LAB — GLUCOSE, CAPILLARY
Glucose-Capillary: 107 mg/dL — ABNORMAL HIGH (ref 70–99)
Glucose-Capillary: 140 mg/dL — ABNORMAL HIGH (ref 70–99)

## 2021-10-11 LAB — COMPREHENSIVE METABOLIC PANEL
ALT: 29 U/L (ref 0–44)
AST: 36 U/L (ref 15–41)
Albumin: 2.9 g/dL — ABNORMAL LOW (ref 3.5–5.0)
Alkaline Phosphatase: 65 U/L (ref 38–126)
Anion gap: 7 (ref 5–15)
BUN: 15 mg/dL (ref 8–23)
CO2: 22 mmol/L (ref 22–32)
Calcium: 9.1 mg/dL (ref 8.9–10.3)
Chloride: 111 mmol/L (ref 98–111)
Creatinine, Ser: 1.56 mg/dL — ABNORMAL HIGH (ref 0.61–1.24)
GFR, Estimated: 46 mL/min — ABNORMAL LOW (ref >60)
Glucose, Bld: 94 mg/dL (ref 70–99)
Potassium: 3.9 mmol/L (ref 3.5–5.1)
Sodium: 140 mmol/L (ref 135–145)
Total Bilirubin: 1.7 mg/dL — ABNORMAL HIGH (ref 0.3–1.2)
Total Protein: 5.5 g/dL — ABNORMAL LOW (ref 6.5–8.1)

## 2021-10-11 MED ORDER — APIXABAN 5 MG PO TABS: 10.0000 mg | ORAL_TABLET | Freq: Two times a day (BID) | ORAL | 0 refills | Status: DC

## 2021-10-11 MED ORDER — APIXABAN 5 MG PO TABS
5.0000 mg | ORAL_TABLET | Freq: Two times a day (BID) | ORAL | 11 refills | Status: AC
Start: 2021-10-17 — End: ?

## 2021-10-11 MED ORDER — ATORVASTATIN CALCIUM 80 MG PO TABS: 80.0000 mg | ORAL_TABLET | Freq: Every day | ORAL | 11 refills | Status: DC

## 2021-10-11 NOTE — Progress Notes (Signed)
Mobility Specialist Progress Note    10/11/21 1045  Mobility  Activity Ambulated independently in hallway  Level of Assistance Standby assist, set-up cues, supervision of patient - no hands on  Assistive Device None  Distance Ambulated (ft) 400 ft  Activity Response Tolerated well  $Mobility charge 1 Mobility   Pt received in chair and agreeable. No complaints. Returned to room with MD.   Hildred Alamin Mobility Specialist

## 2021-10-11 NOTE — Progress Notes (Signed)
I went over Mr Fyock discharge paperwork with him and his wife. They said they understood the instructions and had no questions.  Dianah Field, RN

## 2021-10-11 NOTE — Discharge Summary (Signed)
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 DOSE REDUCTION: This exam was performed according to the departmental dose-optimization program which includes automated exposure control, adjustment of the mA and/or kV according to patient size and/or use of iterative reconstruction technique. CONTRAST:  40m OMNIPAQUE IOHEXOL 350 MG/ML SOLN COMPARISON:  None Available. FINDINGS: Cardiovascular: Bilateral large filling defects within the proximal LEFT and RIGHT main pulmonary arteries consistent acute pulmonary emboli. These emboli are occlusive and extend into the bilateral lower lobes and bilateral upper lobes. RIGHT ventricular diameter  to LEFT ventricular diameter ratio (5.8 : 2.3) = 2.5 Mediastinum/Nodes: No axillary or supraclavicular adenopathy. No mediastinal or hilar adenopathy. No pericardial fluid. Esophagus normal. Lungs/Pleura: No pulmonary infarction. Bilateral small pleural  effusions. No pneumothorax. Upper Abdomen: Limited view of the liver, kidneys, pancreas are unremarkable. Normal adrenal glands. Musculoskeletal: No aggressive osseous lesion. Chronic elevation of the LEFT hemidiaphragm. Review of the MIP images confirms the above findings. IMPRESSION: 1. Acute bilateral pulmonary emboli. Pulmonary emboli are proximal and occlusive with severe clot burden. 2. Positive for acute PE with CT evidence of right heart strain (RV/LV Ratio = 2.5) consistent with at least submassive (intermediate risk) PE. The presence of right heart strain has been associated with an increased risk of morbidity and mortality. Please refer to the "Code PE Focused" order set in EPIC. 3. Chronic elevation LEFT hemidiaphragm. Electronically Signed: By: Suzy Bouchard M.D. On: 10/06/2021 12:17   DG Abd 1 View  Result Date: 10/06/2021 CLINICAL DATA:  Diarrhea. EXAM: ABDOMEN - 1 VIEW COMPARISON:  February 22, 2012 FINDINGS: A paucity of bowel gas is noted throughout the abdomen with a small amount of aerated bowel seen within the mid to upper right abdomen and left lower quadrant. No radio-opaque calculi or other significant radiographic abnormality are seen. 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 IMPRESSION: Paucity of bowel gas throughout the abdomen without evidence of bowel obstruction. Electronically Signed   By: Virgina Norfolk M.D.   On: 10/06/2021 04:10   DG Chest Port 1 View  Result Date: 10/05/2021 CLINICAL DATA:  CP, hypotension EXAM: PORTABLE CHEST 1 VIEW COMPARISON:  None Available. FINDINGS: The heart and mediastinal contours are within normal limits. Aortic calcification. Low lung volumes and elevated left hemidiaphragm. Left base atelectasis. No focal consolidation. No pulmonary edema. No pleural effusion. No pneumothorax. No acute osseous abnormality. IMPRESSION: 1. Low lung volumes with no active disease. 2.  Aortic  Atherosclerosis (ICD10-I70.0). Electronically Signed   By: Iven Finn M.D.   On: 10/05/2021 23:22    Microbiology: Recent Results (from the past 240 hour(s))  Urine Culture     Status: Abnormal   Collection Time: 10/05/21  6:41 PM   Specimen: In/Out Cath Urine  Result Value Ref Range Status   Specimen Description IN/OUT CATH URINE  Final   Special Requests   Final    NONE Performed at [facility], [address], [place], [place] [address]    Culture MULTIPLE SPECIES PRESENT, SUGGEST RECOLLECTION (A)  Final   Report Status 10/08/2021 FINAL  Final  Culture, blood (routine x 2)     Status: None   Collection Time: 10/06/21 12:06 AM   Specimen: BLOOD LEFT WRIST  Result Value Ref Range Status   Specimen Description BLOOD LEFT WRIST  Final   Special Requests   Final    BOTTLES DRAWN AEROBIC AND ANAEROBIC Blood Culture adequate volume   Culture   Final    NO GROWTH 5 DAYS Performed at [facility], [place] [address] [place], [place] [address]    Report Status 10/11/2021 FINAL  Final  Culture, blood (routine x 2)     Status: None   Collection Time: 10/06/21 12:14 AM   Specimen: BLOOD RIGHT HAND  Result Value Ref Range Status   Specimen Description BLOOD RIGHT HAND  Final   Special Requests   Final    BOTTLES DRAWN AEROBIC AND ANAEROBIC Blood Culture results may not  be optimal due to an inadequate volume of blood received in culture bottles   Culture   Final    NO GROWTH 5 DAYS Performed at [facility], [place] [address], [place], [place] [address]    Report Status 10/11/2021 FINAL  Final  MRSA Next Gen by PCR, Nasal     Status: None   Collection Time: 10/06/21  5:42 PM   Specimen: Nasal Mucosa; Nasal Swab  Result Value Ref Range Status   MRSA by PCR Next Gen NOT DETECTED NOT DETECTED Final    Comment: (NOTE) The GeneXpert MRSA Assay (FDA approved for NASAL specimens only), is one component of a comprehensive MRSA colonization surveillance program. It is not  intended to diagnose MRSA infection nor to guide or monitor treatment for MRSA infections. Test performance is not FDA approved in patients less than 68 years old. Performed at [facility], [place] [address], [place], [place] [address]      Labs: Basic Metabolic Panel: Recent Labs  Lab 10/05/21 2305 10/05/21 2325 10/06/21 0636 10/07/21 0515 10/08/21 0530 10/09/21 0132 10/10/21 0202 10/11/21 0341  NA 139   < > 140 140 138 140 140 140  K 4.4   < > 4.9 3.9 3.6 3.9 3.9 3.9  CL 110   < > 119* 115* 110 115* 115* 111  CO2 20*  --  14* 19* 21* 20* 19* 22  GLUCOSE 190*   < > 145* 106* 110* 100* 96 94  BUN 20   < > '20 22 20 21 20 15  '$ CREATININE 2.01*   < > 1.75* 1.61* 1.57* 1.59* 1.59* 1.56*  CALCIUM 8.8*  --  8.2* 8.8* 8.3* 8.6* 8.7* 9.1  MG 1.9  --  1.9 1.8 2.0  --   --   --   PHOS  --   --  2.6 2.1*  --   --   --   --    < > = values in this interval not displayed.   Liver Function Tests: Recent Labs  Lab 10/05/21 2305 10/06/21 0636 10/09/21 0132 10/10/21 0202 10/11/21 0341  AST 28 32 20 31 36  ALT '21 23 19 23 29  '$ ALKPHOS 72 71 59 62 65  BILITOT 0.8 0.5 0.8 1.1 1.7*  PROT 6.1* 5.3* 5.0* 5.3* 5.5*  ALBUMIN 3.3* 2.8* 2.6* 2.8* 2.9*   No results for input(s): "LIPASE", "AMYLASE" in the last 168 hours. No results for input(s): "AMMONIA" in the last 168 hours. CBC: Recent Labs  Lab 10/05/21 2305 10/05/21 2325 10/06/21 0636 10/06/21 1812 10/08/21 0530 10/09/21 0132 10/09/21 1523 10/10/21 0202 10/11/21 0341  WBC 11.0*  --  8.4   < > 9.2 9.0 7.6 9.2 7.0  NEUTROABS 8.3*  --  6.8  --   --   --   --  5.6 4.4  HGB 13.0   < > 11.8*   < > 7.5* 7.3* 7.9* 9.0* 9.5*  HCT 40.2   < > 36.5*   < > 23.1* 22.9* 23.1* 27.1* 27.2*  MCV 97.3  --  96.8   < > 96.7 96.2 91.3 93.1 91.6  PLT 113*  --  96*   < > 87* 94* 86* 122* 158   < > = values in this interval not displayed.   Cardiac Enzymes: No results for input(s): "CKTOTAL", "CKMB", "CKMBINDEX", "TROPONINI" in the last 168  hours. BNP: BNP (last 3 results) Recent Labs    10/06/21 0638  BNP 71.7  ProBNP (last 3 results) No results for input(s): "PROBNP" in the last 8760 hours.  CBG: Recent Labs  Lab 10/10/21 1702 10/10/21 2039 10/10/21 2325 10/11/21 0354 10/11/21 0758  GLUCAP 120* 156* 138* 107* 140*       Signed:  Nita Sells MD   [facility] 10/11/2021, 10:55 AM

## 2021-10-16 DIAGNOSIS — I10 Essential (primary) hypertension: Secondary | ICD-10-CM | POA: Diagnosis not present

## 2021-10-16 DIAGNOSIS — D72829 Elevated white blood cell count, unspecified: Secondary | ICD-10-CM | POA: Diagnosis not present

## 2021-10-16 DIAGNOSIS — Z8546 Personal history of malignant neoplasm of prostate: Secondary | ICD-10-CM | POA: Diagnosis not present

## 2021-10-16 DIAGNOSIS — N1831 Chronic kidney disease, stage 3a: Secondary | ICD-10-CM | POA: Diagnosis not present

## 2021-10-16 DIAGNOSIS — I2699 Other pulmonary embolism without acute cor pulmonale: Secondary | ICD-10-CM | POA: Diagnosis not present

## 2021-10-16 DIAGNOSIS — I129 Hypertensive chronic kidney disease with stage 1 through stage 4 chronic kidney disease, or unspecified chronic kidney disease: Secondary | ICD-10-CM | POA: Diagnosis not present

## 2021-10-16 DIAGNOSIS — E875 Hyperkalemia: Secondary | ICD-10-CM | POA: Diagnosis not present

## 2021-10-16 DIAGNOSIS — N179 Acute kidney failure, unspecified: Secondary | ICD-10-CM | POA: Diagnosis not present

## 2021-10-16 DIAGNOSIS — R7302 Impaired glucose tolerance (oral): Secondary | ICD-10-CM | POA: Diagnosis not present

## 2021-10-28 DIAGNOSIS — H5201 Hypermetropia, right eye: Secondary | ICD-10-CM | POA: Diagnosis not present

## 2021-10-28 DIAGNOSIS — H25811 Combined forms of age-related cataract, right eye: Secondary | ICD-10-CM | POA: Diagnosis not present

## 2021-10-28 DIAGNOSIS — H2522 Age-related cataract, morgagnian type, left eye: Secondary | ICD-10-CM | POA: Diagnosis not present

## 2021-10-28 DIAGNOSIS — H52201 Unspecified astigmatism, right eye: Secondary | ICD-10-CM | POA: Diagnosis not present

## 2021-10-28 DIAGNOSIS — H50112 Monocular exotropia, left eye: Secondary | ICD-10-CM | POA: Diagnosis not present

## 2021-10-28 DIAGNOSIS — H524 Presbyopia: Secondary | ICD-10-CM | POA: Diagnosis not present

## 2021-10-29 ENCOUNTER — Encounter: Payer: Self-pay | Admitting: Pulmonary Disease

## 2021-10-29 ENCOUNTER — Ambulatory Visit (INDEPENDENT_AMBULATORY_CARE_PROVIDER_SITE_OTHER): Payer: PPO | Admitting: Pulmonary Disease

## 2021-10-29 VITALS — BP 118/68 | HR 79 | Temp 98.2°F | Ht 72.0 in | Wt 190.8 lb

## 2021-10-29 DIAGNOSIS — I2699 Other pulmonary embolism without acute cor pulmonale: Secondary | ICD-10-CM | POA: Diagnosis not present

## 2021-10-29 NOTE — Progress Notes (Signed)
$'@Patient'C$  ID: Nelida Gores, male    DOB: 10-26-1945, 76 y.o.   MRN: [id number]  Chief Complaint  Patient presents with   Hospitalization Follow-up    Pt states he has been great since he got out of the hospital 2 weeks ago. Breathing getting better everyday    Referring provider: [name], MD  HPI:   76 y.o. man whom we are seeing in hospital follow-up after massive PE.  Discharge summary reviewed.  Overall doing well.  Admitted to the hospital with progressive shortness of breath and chest pain.  Found to have large clot burden on CTA chest.  Reported syncope at home.  Underwent catheter directed lytics 10/06/21.  TTE 10/06/2021 demonstrated RV dysfunction with elevated right-sided pressures.  Had repeat TTE 10/09/2021 that showed resolution of RV dysfunction, normal RV function, normal estimated right-sided pressures.  Some confusion about apixaban dosing.  Completed the 2 tablet twice daily for a week.  Only been taking 1 tablet of 5 mg daily.  Not twice daily as prescribed.  This was clarified he understands, will take twice a day.  No issues with bleeding.  No melena or hematochezia.  No coffee-ground emesis etc.  No easy bruising.  No weakness.  Dyspnea are slowly improving.  No chest pain.  All gone.  Questionaires / Pulmonary Flowsheets:   ACT:      No data to display          MMRC:     No data to display          Epworth:      No data to display          Tests:   FENO:  No results found for: "NITRICOXIDE"  PFT:     No data to display          WALK:      No data to display          Imaging: Personally reviewed and as per EMR ECHOCARDIOGRAM LIMITED  Result Date: 10/10/2021    ECHOCARDIOGRAM LIMITED REPORT   Patient Name:   HAGAN VANAUKEN Date of Exam: 10/09/2021 Medical Rec #:  [id number]    Height:       72.0 in Accession #:    [id number]   Weight:       202.8 lb Date of Birth:  Jan 17, 1946    BSA:          2.143 m Patient Age:    55 years      BP:           123/76 mmHg Patient Gender: M            HR:           80 bpm. Exam Location:  Inpatient Procedure: Limited Echo, Limited Color Doppler, Cardiac Doppler and Intracardiac            Opacification Agent Indications:    Pulmonary Embolus I26.09  History:        Patient has prior history of Echocardiogram examinations, most                 recent 10/06/2021. Risk Factors:Hypertension.  Sonographer:    Bernadene Person RDCS Referring Phys: [id number] [place]  1. Right ventricular function has improved.  2. Left ventricular ejection fraction, by estimation, is 60 to 65%. The left ventricle has normal function. Left ventricular diastolic function could not be evaluated.  3. Right ventricular systolic function is normal. The right  ventricular size is normal. There is normal pulmonary artery systolic pressure.  4. The mitral valve is normal in structure. No evidence of mitral valve regurgitation. No evidence of mitral stenosis.  5. The aortic valve was not well visualized. Aortic valve regurgitation is not visualized. No aortic stenosis is present. FINDINGS  Left Ventricle: Left ventricular ejection fraction, by estimation, is 60 to 65%. The left ventricle has normal function. Definity contrast agent was given IV to delineate the left ventricular endocardial borders. There is no left ventricular hypertrophy. Left ventricular diastolic function could not be evaluated. Right Ventricle: The right ventricular size is normal. No increase in right ventricular wall thickness. Right ventricular systolic function is normal. There is normal pulmonary artery systolic pressure. The tricuspid regurgitant velocity is 1.68 m/s, and  with an assumed right atrial pressure of 3 mmHg, the estimated right ventricular systolic pressure is 65.7 mmHg. Mitral Valve: The mitral valve is normal in structure. No evidence of mitral valve stenosis. Tricuspid Valve: The tricuspid valve is normal in structure. Tricuspid  valve regurgitation is trivial. No evidence of tricuspid stenosis. Aortic Valve: The aortic valve was not well visualized. Aortic valve regurgitation is not visualized. No aortic stenosis is present. Pulmonic Valve: The pulmonic valve was normal in structure. Pulmonic valve regurgitation is not visualized. No evidence of pulmonic stenosis. Aorta: The aortic root is normal in size and structure.  LV Volumes (MOD) LV vol d, MOD A2C: 85.8 ml LV vol d, MOD A4C: 95.6 ml LV vol s, MOD A2C: 31.3 ml LV vol s, MOD A4C: 29.6 ml LV SV MOD A2C:     54.5 ml LV SV MOD A4C:     95.6 ml LV SV MOD BP:      66.1 ml RIGHT VENTRICLE RV S prime:     17.50 cm/s TAPSE (M-mode): 2.2 cm RIGHT ATRIUM           Index RA Area:     13.80 cm RA Volume:   30.30 ml  14.14 ml/m  TRICUSPID VALVE TR Peak grad:   11.3 mmHg TR Vmax:        168.00 cm/s Skeet Latch MD Electronically signed by Skeet Latch MD Signature Date/Time: 10/10/2021/1:13:15 PM    Final    IR THROMB F/U EVAL ART/VEN FINAL DAY (MS)  Result Date: 10/07/2021 INDICATION: 76 year old male status post initiation of bilateral pulmonary artery thrombolysis on 10/06/2021 at approximately 17:00. EXAM: IR THROMB F/U EVAL ART/VEN FINAL DAY COMPARISON:  10/06/2021 MEDICATIONS: None. ANESTHESIA/SEDATION: None. FLUOROSCOPY TIME:  None. COMPLICATIONS: None immediate. TECHNIQUE: Pressure transducer was affixed to 1 of the indwelling pulmonary lysis catheters and manometry was obtained. The catheters and sheath were then removed without complication. Hemostasis was achieved with brief manual compression. FINDINGS: Pulmonary artery pressure equals 19/4, mean 9 mm Hg. IMPRESSION: Significant improvement in symptoms and resolution of pulmonary hypertension. Indwelling pulmonary artery lysis catheters were then removed without complication. Ruthann Cancer, MD [facility] Radiology Electronically Signed   By: Ruthann Cancer M.D.   On: 10/07/2021  17:31   IR INFUSION THROMBOL ARTERIAL INITIAL (MS)  INDICATION: 76 year old male status post initiation of bilateral pulmonary artery thrombolysis on 10/06/2021 at approximately 17:00. EXAM: IR THROMB F/U EVAL ART/VEN FINAL DAY COMPARISON:  10/06/2021 MEDICATIONS: None. ANESTHESIA/SEDATION: None. FLUOROSCOPY TIME:  None. COMPLICATIONS: None immediate. TECHNIQUE: Pressure transducer was affixed to 1 of the indwelling pulmonary lysis catheters and manometry was obtained. The catheters and sheath were then removed without complication. Hemostasis was  achieved with brief manual compression. FINDINGS: Pulmonary artery pressure equals 19/4, mean 9 mm Hg. IMPRESSION: Significant improvement in symptoms and resolution of pulmonary hypertension. Indwelling pulmonary artery lysis catheters were then removed without complication. Ruthann Cancer, MD [facility] Radiology Electronically Signed   By: Ruthann Cancer M.D.   On: 10/07/2021 17:31   IR US Guide Vasc Access Right  INDICATION: 76 year old male status post initiation of bilateral pulmonary artery thrombolysis on 10/06/2021 at approximately 17:00. EXAM: IR THROMB F/U EVAL ART/VEN FINAL DAY COMPARISON:  10/06/2021 MEDICATIONS: None. ANESTHESIA/SEDATION: None. FLUOROSCOPY TIME:  None. COMPLICATIONS: None immediate. TECHNIQUE: Pressure transducer was affixed to 1 of the indwelling pulmonary lysis catheters and manometry was obtained. The catheters and sheath were then removed without complication. Hemostasis was achieved with brief manual compression. FINDINGS: Pulmonary artery pressure equals 19/4, mean 9 mm Hg. IMPRESSION: Significant improvement in symptoms and resolution of pulmonary hypertension. Indwelling pulmonary artery lysis catheters were then removed without complication. Ruthann Cancer, MD [facility] Radiology Electronically Signed   By: Ruthann Cancer M.D.   On:  10/07/2021 17:31   IR Angiogram Pulmonary Bilateral Selective  INDICATION: 76 year old male status post initiation of bilateral pulmonary artery thrombolysis on 10/06/2021 at approximately 17:00. EXAM: IR THROMB F/U EVAL ART/VEN FINAL DAY COMPARISON:  10/06/2021 MEDICATIONS: None. ANESTHESIA/SEDATION: None. FLUOROSCOPY TIME:  None. COMPLICATIONS: None immediate. TECHNIQUE: Pressure transducer was affixed to 1 of the indwelling pulmonary lysis catheters and manometry was obtained. The catheters and sheath were then removed without complication. Hemostasis was achieved with brief manual compression. FINDINGS: Pulmonary artery pressure equals 19/4, mean 9 mm Hg. IMPRESSION: Significant improvement in symptoms and resolution of pulmonary hypertension. Indwelling pulmonary artery lysis catheters were then removed without complication. Ruthann Cancer, MD [facility] Radiology Electronically Signed   By: Ruthann Cancer M.D.   On: 10/07/2021 17:31   IR Angiogram Selective Each Additional Vessel  INDICATION: 76 year old male status post initiation of bilateral pulmonary artery thrombolysis on 10/06/2021 at approximately 17:00. EXAM: IR THROMB F/U EVAL ART/VEN FINAL DAY COMPARISON:  10/06/2021 MEDICATIONS: None. ANESTHESIA/SEDATION: None. FLUOROSCOPY TIME:  None. COMPLICATIONS: None immediate. TECHNIQUE: Pressure transducer was affixed to 1 of the indwelling pulmonary lysis catheters and manometry was obtained. The catheters and sheath were then removed without complication. Hemostasis was achieved with brief manual compression. FINDINGS: Pulmonary artery pressure equals 19/4, mean 9 mm Hg. IMPRESSION: Significant improvement in symptoms and resolution of pulmonary hypertension. Indwelling pulmonary artery lysis catheters were then removed without complication. Ruthann Cancer, MD [facility] Radiology Electronically Signed    By: Ruthann Cancer M.D.   On: 10/07/2021 17:31   IR Angiogram Selective Each Additional Vessel  INDICATION: 76 year old male status post initiation of bilateral pulmonary artery thrombolysis on 10/06/2021 at approximately 17:00. EXAM: IR THROMB F/U EVAL ART/VEN FINAL DAY COMPARISON:  10/06/2021 MEDICATIONS: None. ANESTHESIA/SEDATION: None. FLUOROSCOPY TIME:  None. COMPLICATIONS: None immediate. TECHNIQUE: Pressure transducer was affixed to 1 of the indwelling pulmonary lysis catheters and manometry was obtained. The catheters and sheath were then removed without complication. Hemostasis was achieved with brief manual compression. FINDINGS: Pulmonary artery pressure equals 19/4, mean 9 mm Hg. IMPRESSION: Significant improvement in symptoms and resolution of pulmonary hypertension. Indwelling pulmonary artery lysis catheters were then removed without complication. Ruthann Cancer, MD [facility] Radiology Electronically Signed   By: Ruthann Cancer M.D.   On: 10/07/2021 17:31   IR INFUSION  THROMBOL VENOUS INITIAL (MS)  Result Date: 10/07/2021 INDICATION: Sub massive bilateral pulmonary emboli.  Elevated creatinine. EXAM: 1. ULTRASOUND GUIDANCE FOR VENOUS ACCESS X2 2. BILATERAL PULMONARY ARTERIOGRAPHY 3. FLUOROSCOPIC GUIDED PLACEMENT OF BILATERAL PULMONARY ARTERIAL LYTIC INFUSION CATHETERS COMPARISON:  CTA from earlier the same day MEDICATIONS: Intravenous Fentanyl 71mg and Versed 1.'5mg'$  were administered as conscious sedation during continuous monitoring of the patient's level of consciousness and physiological / cardiorespiratory status by the radiology RN, with a total moderate sedation time of 32 minutes. CONTRAST:  None required FLUOROSCOPY TIME:  Radiation Exposure Index (as provided by the fluoroscopic device): 75 mGy air Kerma COMPLICATIONS: None immediate TECHNIQUE: Informed written consent was obtained from the patient after a discussion of the risks, benefits  and alternatives to treatment. Questions regarding the procedure were encouraged and answered. A timeout was performed prior to the initiation of the procedure. Ultrasound scanning was performed demonstrating patency of the right internal jugular vein, selected for vascular access. The right neck was prepped and draped in the usual sterile fashion, and a sterile drape was applied covering the operative field. Maximum barrier sterile technique with sterile gowns and gloves were used for the procedure. A timeout was performed prior to the initiation of the procedure. Local anesthesia was provided with 1% lidocaine. Under direct ultrasound guidance, the right internal jugular vein was accessed with a micro puncture sheath ultimately allowing placement of a 6 French vascular sheath. Slightly cranial to this initial access, the right internal jugular vein was again accessed with a micropuncture sheath ultimately allowing placement of a 6 French vascular sheath. With the use of a Bentson wire, an angled pigtail catheter was advanced into the left main pulmonary artery . Pressure measurements were then obtained from the left pulmonary artery. Over an exchange length Rosen wire, the pigtail catheter was exchanged for a 90/10 cm multi side-hole infusion catheter. With the use of a Bentson wire, a pigtail catheter was advanced into the right main pulmonary artery . Over an exchange length Rosen wire, the pigtail catheter was exchanged for a 90/15 cm multi side-hole infusion catheter. A postprocedural fluoroscopic image was obtained of the check demonstrating final catheter positioning. Both vascular sheath were secured at the right neck with 0 silk suture. The external catheter tubing was secured at the right neck and the lytic therapy was initiated. The patient tolerated the procedure well without immediate postprocedural complication. FINDINGS: Acquired pressure measurements: Left main pulmonary artery-40/10 mmHg mean 22  (normal: < 25/10) Following the procedure, both infusion catheter tips terminate within the distal aspects of the bilateral lower lobe sub segmental pulmonary arteries. IMPRESSION: 1. Successful fluoroscopic guided initiation of bilateral catheter directed pulmonary arterial lysis for sub massive pulmonary embolism and right-sided heart strain. 2. Elevated pressure measurements within the left main pulmonary artery compatible with pulmonary arterial hypertension. PLAN: -follow-up pulmonary arterial pressure measurements after the 12 hour catheter directed pulmonary arterial lysis Electronically Signed   By: DLucrezia EuropeM.D.   On: 10/07/2021 08:34   ECHOCARDIOGRAM COMPLETE  Result Date: 10/06/2021    ECHOCARDIOGRAM REPORT   Patient Name:   MBASHIR MARCHETTIDate of Exam: 10/06/2021 Medical Rec #:  [id number]   Height:       72.0 in Accession #:    [id number]  Weight:       198.0 lb Date of Birth:  309/19/1947   BSA:          2.121 m Patient Age:    89years  BP:           86/75 mmHg Patient Gender: M            HR:           75 bpm. Exam Location:  Inpatient Procedure: 2D Echo, Cardiac Doppler, Color Doppler and Intracardiac            Opacification Agent Indications:    CHF  History:        Patient has no prior history of Echocardiogram examinations.                 Risk Factors:Hypertension.  Sonographer:    Jefferey Pica Referring Phys: [id number] [place]  1. Left ventricular ejection fraction, by estimation, is 55 to 60%. The left ventricle has normal function. The left ventricle has no regional wall motion abnormalities. There is mild concentric left ventricular hypertrophy. Left ventricular diastolic parameters are indeterminate.  2. Right ventricular systolic function is mildly reduced. The right ventricular size is mildly enlarged. There is moderately elevated pulmonary artery systolic pressure.  3. The mitral valve is normal in structure. No evidence of mitral valve regurgitation. No  evidence of mitral stenosis.  4. Tricuspid valve regurgitation is mild to moderate.  5. The aortic valve is tricuspid. Aortic valve regurgitation is not visualized. No aortic stenosis is present.  6. There is borderline dilatation of the aortic root, measuring 36 mm. There is mild dilatation of the ascending aorta, measuring 38 mm.  7. The inferior vena cava is dilated in size with <50% respiratory variability, suggesting right atrial pressure of 15 mmHg. Comparison(s): No prior Echocardiogram. FINDINGS  Left Ventricle: Left ventricular ejection fraction, by estimation, is 55 to 60%. The left ventricle has normal function. The left ventricle has no regional wall motion abnormalities. The left ventricular internal cavity size was normal in size. There is  mild concentric left ventricular hypertrophy. Left ventricular diastolic parameters are indeterminate. Right Ventricle: The right ventricular size is mildly enlarged. No increase in right ventricular wall thickness. Right ventricular systolic function is mildly reduced. There is moderately elevated pulmonary artery systolic pressure. The tricuspid regurgitant velocity is 2.89 m/s, and with an assumed right atrial pressure of 15 mmHg, the estimated right ventricular systolic pressure is 24.0 mmHg. Left Atrium: Left atrial size was normal in size. Right Atrium: Right atrial size was normal in size. Pericardium: There is no evidence of pericardial effusion. Presence of epicardial fat layer. Mitral Valve: The mitral valve is normal in structure. No evidence of mitral valve regurgitation. No evidence of mitral valve stenosis. Tricuspid Valve: The tricuspid valve is normal in structure. Tricuspid valve regurgitation is mild to moderate. No evidence of tricuspid stenosis. Aortic Valve: The aortic valve is tricuspid. Aortic valve regurgitation is not visualized. No aortic stenosis is present. Aortic valve peak gradient measures 3.1 mmHg. Pulmonic Valve: The pulmonic valve  was normal in structure. Pulmonic valve regurgitation is not visualized. No evidence of pulmonic stenosis. Aorta: The aortic root is normal in size and structure. There is borderline dilatation of the aortic root, measuring 36 mm. There is mild dilatation of the ascending aorta, measuring 38 mm. Venous: The inferior vena cava is dilated in size with less than 50% respiratory variability, suggesting right atrial pressure of 15 mmHg. IAS/Shunts: No atrial level shunt detected by color flow Doppler.  LEFT VENTRICLE PLAX 2D LVIDd:         3.60 cm   Diastology LVIDs:  2.30 cm   LV e' medial:    5.98 cm/s LV PW:         1.00 cm   LV E/e' medial:  7.6 LV IVS:        1.10 cm   LV e' lateral:   6.53 cm/s LVOT diam:     2.10 cm   LV E/e' lateral: 7.0 LVOT Area:     3.46 cm  IVC IVC diam: 2.80 cm LEFT ATRIUM             Index        RIGHT ATRIUM           Index LA diam:        2.60 cm 1.23 cm/m   RA Area:     11.40 cm LA Vol (A2C):   32.4 ml 15.27 ml/m  RA Volume:   23.30 ml  10.98 ml/m LA Vol (A4C):   24.0 ml 11.31 ml/m LA Biplane Vol: 29.1 ml 13.72 ml/m  AORTIC VALVE AV Vmax:      88.67 cm/s AV Peak Grad: 3.1 mmHg  AORTA Ao Root diam: 3.60 cm Ao Asc diam:  3.80 cm MITRAL VALVE               TRICUSPID VALVE MV Area (PHT): 3.76 cm    TR Peak grad:   33.4 mmHg MV Decel Time: 202 msec    TR Vmax:        289.00 cm/s MV E velocity: 45.60 cm/s MV A velocity: 88.60 cm/s  SHUNTS MV E/A ratio:  0.51        Systemic Diam: 2.10 cm Kardie Tobb DO Electronically signed by Berniece Salines DO Signature Date/Time: 10/06/2021/2:03:08 PM    Final    CT Angio Chest Pulmonary Embolism (PE) W or WO Contrast  Addendum Date: 10/06/2021   ADDENDUM REPORT: 10/06/2021 12:39 ADDENDUM: Critical Value/emergent results were called by telephone at the time of interpretation on 10/06/2021 at 12:30 pm to provider Dr. Tawanna Solo, who verbally acknowledged these results. Initial attempts to contact the emergency department begun at 12:08 p.m.  Electronically Signed   By: Suzy Bouchard M.D.   On: 10/06/2021 12:39   Result Date: 10/06/2021 CLINICAL DATA:  Elevated D-dimer.  Concern for pulmonary embolism. EXAM: CT ANGIOGRAPHY CHEST WITH CONTRAST TECHNIQUE: Multidetector CT imaging of the chest was performed using the standard protocol during bolus administration of intravenous contrast. Multiplanar CT image reconstructions and MIPs were obtained to evaluate the vascular anatomy. RADIATION DOSE REDUCTION: This exam was performed according to the departmental dose-optimization program which includes automated exposure control, adjustment of the mA and/or kV according to patient size and/or use of iterative reconstruction technique. CONTRAST:  24m OMNIPAQUE IOHEXOL 350 MG/ML SOLN COMPARISON:  None Available. FINDINGS: Cardiovascular: Bilateral large filling defects within the proximal LEFT and RIGHT main pulmonary arteries consistent acute pulmonary emboli. These emboli are occlusive and extend into the bilateral lower lobes and bilateral upper lobes. RIGHT ventricular diameter to LEFT ventricular diameter ratio (5.8 : 2.3) = 2.5 Mediastinum/Nodes: No axillary or supraclavicular adenopathy. No mediastinal or hilar adenopathy. No pericardial fluid. Esophagus normal. Lungs/Pleura: No pulmonary infarction. Bilateral small pleural effusions. No pneumothorax. Upper Abdomen: Limited view of the liver, kidneys, pancreas are unremarkable. Normal adrenal glands. Musculoskeletal: No aggressive osseous lesion. Chronic elevation of the LEFT hemidiaphragm. Review of the MIP images confirms the above findings. IMPRESSION: 1. Acute bilateral pulmonary emboli. Pulmonary emboli are proximal and occlusive with severe clot burden. 2. Positive for  acute PE with CT evidence of right heart strain (RV/LV Ratio = 2.5) consistent with at least submassive (intermediate risk) PE. The presence of right heart strain has been associated with an increased risk of morbidity and  mortality. Please refer to the "Code PE Focused" order set in EPIC. 3. Chronic elevation LEFT hemidiaphragm. Electronically Signed: By: Suzy Bouchard M.D. On: 10/06/2021 12:17   DG Abd 1 View  Result Date: 10/06/2021 CLINICAL DATA:  Diarrhea. EXAM: ABDOMEN - 1 VIEW COMPARISON:  February 22, 2012 FINDINGS: A paucity of bowel gas is noted throughout the abdomen with a small amount of aerated bowel seen within the mid to upper right abdomen and left lower quadrant. No radio-opaque calculi or other significant radiographic abnormality are seen. 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 IMPRESSION: Paucity of bowel gas throughout the abdomen without evidence of bowel obstruction. Electronically Signed   By: Virgina Norfolk M.D.   On: 10/06/2021 04:10   DG Chest Port 1 View  Result Date: 10/05/2021 CLINICAL DATA:  CP, hypotension EXAM: PORTABLE CHEST 1 VIEW COMPARISON:  None Available. FINDINGS: The heart and mediastinal contours are within normal limits. Aortic calcification. Low lung volumes and elevated left hemidiaphragm. Left base atelectasis. No focal consolidation. No pulmonary edema. No pleural effusion. No pneumothorax. No acute osseous abnormality. IMPRESSION: 1. Low lung volumes with no active disease. 2.  Aortic Atherosclerosis (ICD10-I70.0). Electronically Signed   By: Iven Finn M.D.   On: 10/05/2021 23:22    Lab Results: Personally reviewed CBC    Component Value Date/Time   WBC 7.0 10/11/2021 0341   RBC 2.97 (L) 10/11/2021 0341   HGB 9.5 (L) 10/11/2021 0341   HCT 27.2 (L) 10/11/2021 0341   PLT 158 10/11/2021 0341   MCV 91.6 10/11/2021 0341   MCH 32.0 10/11/2021 0341   MCHC 34.9 10/11/2021 0341   RDW 15.3 10/11/2021 0341   LYMPHSABS 1.1 10/11/2021 0341   MONOABS 0.8 10/11/2021 0341   EOSABS 0.4 10/11/2021 0341   BASOSABS 0.0 10/11/2021 0341    BMET    Component Value Date/Time   NA 140 10/11/2021  0341   K 3.9 10/11/2021 0341   CL 111 10/11/2021 0341   CO2 22 10/11/2021 0341   GLUCOSE 94 10/11/2021 0341   BUN 15 10/11/2021 0341   CREATININE 1.56 (H) 10/11/2021 0341   CALCIUM 9.1 10/11/2021 0341   GFRNONAA 46 (L) 10/11/2021 0341   GFRAA 51 (L) 12/05/2018 1643    BNP    Component Value Date/Time   BNP 71.7 10/06/2021 0638    ProBNP No results found for: "PROBNP"  Specialty Problems   None   No Known Allergies  Immunization History  Administered Date(s) Administered   Influenza,inj,quad, With Preservative 02/05/2018   Pneumococcal Polysaccharide-23 10/31/2011    Past Medical History:  Diagnosis Date   Acute venous embolism and thrombosis of unspecified deep vessels of lower extremity    Arthritis    knee    Cancer (HCC)    Clotting disorder (HCC)    DVT many years  ago    Glaucoma    left eye since age 64   Gout    Hip pain    History of radiation therapy    Prostate   Hypertension    Prostate cancer ([place])    Renal disorder    reports prior to joint replacement surgery , was taking 5 to 6 aleve daily for pain  releif, report shas stopped this and kidney function has omporved, kidney fx followed by his PCP       Tobacco History: Social History   Tobacco Use  Smoking Status Never  Smokeless Tobacco Never   Counseling given: Not Answered   Continue to not smoke  Outpatient Encounter Medications as of 10/29/2021  Medication Sig   acetaminophen (TYLENOL) 500 MG tablet Take 1,000 mg by mouth every 6 (six) hours as needed (pain).   amLODipine (NORVASC) 5 MG tablet Take 5 mg by mouth every morning.   apixaban (ELIQUIS) 5 MG TABS tablet Take 1 tablet (5 mg total) by mouth 2 (two) times daily.   atorvastatin (LIPITOR) 80 MG tablet Take 1 tablet (80 mg total) by mouth daily.   triamcinolone cream (KENALOG) 0.1 % Apply 1 application. topically 2 (two) times daily as needed for rash.   apixaban (ELIQUIS) 5 MG TABS tablet Take 2 tablets (10 mg total) by  mouth 2 (two) times daily for 6 days.   No facility-administered encounter medications on file as of 10/29/2021.     Review of Systems  Review of Systems  N/a Physical Exam  BP 118/68 (BP Location: Left Arm, Patient Position: Sitting, Cuff Size: Normal)   Pulse 79   Temp 98.2 F (36.8 C) (Oral)   Ht 6' (1.829 m)   Wt 190 lb 12.8 oz (86.5 kg)   SpO2 98%   BMI 25.88 kg/m   Wt Readings from Last 5 Encounters:  10/29/21 190 lb 12.8 oz (86.5 kg)  10/11/21 195 lb 12.3 oz (88.8 kg)  06/06/19 204 lb (92.5 kg)  05/23/19 204 lb (92.5 kg)  12/05/18 207 lb (93.9 kg)    BMI Readings from Last 5 Encounters:  10/29/21 25.88 kg/m  10/11/21 26.55 kg/m  06/06/19 27.67 kg/m  05/23/19 27.67 kg/m  12/05/18 28.07 kg/m     Physical Exam General: Well-appearing, no acute distress Eyes: EOMI, icterus Neck: Supple, no JVP Pulmonary: Clear, no work of breathing Cardiovascular warm, no edema MSK: No synovitis, no joint effusion, joint replacement scars on knees Abdomen: Nondistended, sounds present Neuro: Normal gait, no weakness Psych: Normal mood, full affect.   Assessment & Plan:   Massive PE: With syncope.  Status post catheter directed lytics 10/06/2021.  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  Recommend lifelong anticoagulation which he expresses understanding.  Clarified dosing of apixaban, 5 mg twice a day.  Denies significant dyspnea.  No chest pain.  RV dysfunction: Acute in the setting of massive PE.  Resolved on limited TTE 3 days after lytic therapy.  Normal RV function with normal estimated RVSP.  No further follow-up needed.  History of hypertension: Low blood pressures with PE in the hospital.  He continues amlodipine 5 mg daily.  Blood pressure very well controlled on measurement today.  Encouraged him to continue monitor at home.  He is not taking lisinopril previously prescribed.  Advised him to discuss with his PCP.   Return in about 3 months (around 01/29/2022).   Lanier Clam, MD 10/29/2021

## 2021-10-29 NOTE — Patient Instructions (Signed)
Nice to see you again  Take apixaban (Eliquis) 1 tablet twice a day every day.  I am okay to stay off the lisinopril.  Please let Dr. Ardeth Perfect know about this.  Your blood pressure today looks great today.  Continue amlodipine for blood pressure as prescribed.  Return to clinic in 3 months or sooner as needed with Dr. Silas Flood

## 2021-11-13 LAB — GLUCOSE, POCT (MANUAL RESULT ENTRY): POC Glucose: 117 mg/dl — AB (ref 70–99)

## 2021-11-20 DIAGNOSIS — E875 Hyperkalemia: Secondary | ICD-10-CM | POA: Diagnosis not present

## 2021-11-20 DIAGNOSIS — I2699 Other pulmonary embolism without acute cor pulmonale: Secondary | ICD-10-CM | POA: Diagnosis not present

## 2021-11-20 DIAGNOSIS — I10 Essential (primary) hypertension: Secondary | ICD-10-CM | POA: Diagnosis not present

## 2021-11-20 DIAGNOSIS — D72829 Elevated white blood cell count, unspecified: Secondary | ICD-10-CM | POA: Diagnosis not present

## 2021-11-20 DIAGNOSIS — M109 Gout, unspecified: Secondary | ICD-10-CM | POA: Diagnosis not present

## 2021-11-20 DIAGNOSIS — D649 Anemia, unspecified: Secondary | ICD-10-CM | POA: Diagnosis not present

## 2021-11-21 DIAGNOSIS — R7989 Other specified abnormal findings of blood chemistry: Secondary | ICD-10-CM | POA: Diagnosis not present

## 2021-11-21 DIAGNOSIS — I10 Essential (primary) hypertension: Secondary | ICD-10-CM | POA: Diagnosis not present

## 2021-11-21 DIAGNOSIS — D649 Anemia, unspecified: Secondary | ICD-10-CM | POA: Diagnosis not present

## 2021-12-15 ENCOUNTER — Telehealth: Payer: Self-pay | Admitting: Physician Assistant

## 2021-12-15 NOTE — Telephone Encounter (Signed)
Scheduled appt per 8/10 referral. Pt is aware of appt date and time. Pt is aware to arrive 15 mins prior to appt time and to bring and updated insurance card. Pt is aware of appt location.   

## 2021-12-25 DIAGNOSIS — I10 Essential (primary) hypertension: Secondary | ICD-10-CM | POA: Diagnosis not present

## 2021-12-25 DIAGNOSIS — G8929 Other chronic pain: Secondary | ICD-10-CM | POA: Diagnosis not present

## 2021-12-25 DIAGNOSIS — I739 Peripheral vascular disease, unspecified: Secondary | ICD-10-CM | POA: Diagnosis not present

## 2021-12-25 DIAGNOSIS — E785 Hyperlipidemia, unspecified: Secondary | ICD-10-CM | POA: Diagnosis not present

## 2021-12-25 DIAGNOSIS — Z86711 Personal history of pulmonary embolism: Secondary | ICD-10-CM | POA: Diagnosis not present

## 2021-12-25 DIAGNOSIS — N1831 Chronic kidney disease, stage 3a: Secondary | ICD-10-CM | POA: Diagnosis not present

## 2021-12-25 DIAGNOSIS — H548 Legal blindness, as defined in USA: Secondary | ICD-10-CM | POA: Diagnosis not present

## 2021-12-25 DIAGNOSIS — I129 Hypertensive chronic kidney disease with stage 1 through stage 4 chronic kidney disease, or unspecified chronic kidney disease: Secondary | ICD-10-CM | POA: Diagnosis not present

## 2021-12-25 DIAGNOSIS — G620 Drug-induced polyneuropathy: Secondary | ICD-10-CM | POA: Diagnosis not present

## 2021-12-25 DIAGNOSIS — M1990 Unspecified osteoarthritis, unspecified site: Secondary | ICD-10-CM | POA: Diagnosis not present

## 2021-12-25 DIAGNOSIS — E663 Overweight: Secondary | ICD-10-CM | POA: Diagnosis not present

## 2022-01-02 ENCOUNTER — Telehealth: Payer: Self-pay | Admitting: Physician Assistant

## 2022-01-02 NOTE — Telephone Encounter (Signed)
R/s pt's new hem appt per pt request. Pt is aware of new appt date/time.  

## 2022-01-06 ENCOUNTER — Inpatient Hospital Stay: Payer: PPO | Admitting: Physician Assistant

## 2022-01-06 ENCOUNTER — Inpatient Hospital Stay: Payer: PPO

## 2022-01-26 NOTE — Progress Notes (Signed)
[place] [phone]   Fax:([phone]) [place] NOTE  Patient Care Team: [name], MD as PCP - General (Internal Medicine)  Hematological/Oncological History #Leukocytosis: -10/01/2021: WBC 8.52, Hgb 14.4, Plt 139 (L),  -10/16/2021: WBC 16.68 (H), Hgb 10.8( L), MCV 97.4 (H), Plt 265K, ANC 12.4 (H) -11/21/2021: WBC 14.28 (H), Hgb 12.1, Plt 165K, ANC 11.1 (H)  #DVT/PE: --12/22/2010: DVT in the distal femoral and popliteal veins. Treated with IV herapin then switched to lovenox therapy as bridge for coumadin. Uncertain length of treatment but was on coumadin until June 2013.  --10/06/2021: Submassive large clot main pulmonary artery PE status post IR directed catheter thrombolysis. Transitioned to Eliquis therapy  CHIEF COMPLAINTS/PURPOSE OF CONSULTATION:  History DVT and recent PE Leukocytosis  HISTORY OF PRESENTING ILLNESS:  Douglas Hoffman 76 y.o. male with medical history significant for prostate cancer status post prostatectomy and radiation, gout, hypertension, and arthritis.  He presents to the hematology clinic for evaluation for leukocytosis and recent diagnosis of submassive pulmonary embolism.  He is unaccompanied for this visit.  On exam today, Douglas Hoffman reports that he is tolerating Eliquis therapy without any prohibitive toxicity.  He denies any bruising or bleeding episodes.  He reports his shortness of breath and chest discomfort resolved after hospital discharge.  He has some left lower extremity swelling that has been present since hospitalization.  He reports that the swelling is slowly improving.  He denies any nausea, vomiting or abdominal pain.  His bowel habits are unchanged without any recurrent episodes of diarrhea or constipation.  He denies any urinary symptoms including hematuria.  He denies any fevers, chills, night sweats, shortness of breath, chest pain or cough.  He has no other complaints.  Rest of the 10 point ROS is  below.  MEDICAL HISTORY:  Past Medical History:  Diagnosis Date   Acute venous embolism and thrombosis of unspecified deep vessels of lower extremity    Arthritis    knee    Cancer (HCC)    Clotting disorder (HCC)    DVT many years  ago    Glaucoma    left eye since age 44   Gout    Hip pain    History of radiation therapy    Prostate   Hypertension    Prostate cancer [facility])    s/p radiation 36 fx   Renal disorder    reports prior to joint replacement surgery , was taking 5 to 6 aleve daily for pain releif, report shas stopped this and kidney function has omporved, kidney fx followed by his PCP       SURGICAL HISTORY: Past Surgical History:  Procedure Laterality Date   COLONOSCOPY  08/14/2010   normal    IR ANGIOGRAM PULMONARY BILATERAL SELECTIVE  10/06/2021   IR ANGIOGRAM SELECTIVE EACH ADDITIONAL VESSEL  10/06/2021   IR ANGIOGRAM SELECTIVE EACH ADDITIONAL VESSEL  10/06/2021   IR INFUSION THROMBOL ARTERIAL INITIAL (MS)  10/06/2021   IR INFUSION THROMBOL VENOUS INITIAL (MS)  10/06/2021   IR THROMB F/U EVAL ART/VEN FINAL DAY (MS)  10/07/2021   IR US GUIDE VASC ACCESS RIGHT  10/06/2021   JOINT REPLACEMENT     RIGHT Hip Replacement   JOINT REPLACEMENT     Knee Replacement   SUPRAPUBIC PROSTATECTOMY     TOTAL KNEE REVISION Left 07/07/2018   Procedure: TOTAL KNEE REVISION LEFT KNEE ARTHROPLASTY WITH FEMORAL AND TIBIAL COMPONENTS;  Surgeon: Rod Can, MD;  Location: [facility];  Service: Orthopedics;  Laterality: Left;    SOCIAL HISTORY: Social History   Socioeconomic History   Marital status: Married    Spouse name: Not on file   Number of children: Not on file   Years of education: Not on file   Highest education level: Not on file  Occupational History   Not on file  Tobacco Use   Smoking status: Never   Smokeless tobacco: Never  Vaping Use   Vaping Use: Never used  Substance and Sexual Activity   Alcohol use: No   Drug use: No   Sexual activity: Not on file  Other  Topics Concern   Not on file  Social History Narrative   Not on file   Social Determinants of Health   Financial Resource Strain: Not on file  Food Insecurity: Not on file  Transportation Needs: Not on file  Physical Activity: Not on file  Stress: Not on file  Social Connections: Not on file  Intimate Partner Violence: Not on file    FAMILY HISTORY: Family History  Problem Relation Age of Onset   Colon cancer Neg Hx    Colon polyps Neg Hx    Esophageal cancer Neg Hx    Rectal cancer Neg Hx    Stomach cancer Neg Hx     ALLERGIES:  is allergic to shellfish allergy.  MEDICATIONS:  Current Outpatient Medications  Medication Sig Dispense Refill   acetaminophen (TYLENOL) 500 MG tablet Take 1,000 mg by mouth every 6 (six) hours as needed (pain).     amLODipine (NORVASC) 5 MG tablet Take 5 mg by mouth every morning.     apixaban (ELIQUIS) 5 MG TABS tablet Take 1 tablet (5 mg total) by mouth 2 (two) times daily. 60 tablet 11   atorvastatin (LIPITOR) 80 MG tablet Take 1 tablet (80 mg total) by mouth daily. 30 tablet 11   triamcinolone cream (KENALOG) 0.1 % Apply 1 application. topically 2 (two) times daily as needed for rash.     apixaban (ELIQUIS) 5 MG TABS tablet Take 2 tablets (10 mg total) by mouth 2 (two) times daily for 6 days. 24 tablet 0   No current facility-administered medications for this visit.    REVIEW OF SYSTEMS:   Constitutional: ( - ) fevers, ( - )  chills , ( - ) night sweats Eyes: ( - ) blurriness of vision, ( - ) double vision, ( - ) watery eyes Ears, nose, mouth, throat, and face: ( - ) mucositis, ( - ) sore throat Respiratory: ( - ) cough, ( - ) dyspnea, ( - ) wheezes Cardiovascular: ( - ) palpitation, ( - ) chest discomfort, ( +) lower extremity swelling Gastrointestinal:  ( - ) nausea, ( - ) heartburn, ( - ) change in bowel habits Skin: ( - ) abnormal skin rashes Lymphatics: ( - ) new lymphadenopathy, ( - ) easy bruising Neurological: ( - ) numbness, (  - ) tingling, ( - ) new weaknesses Behavioral/Psych: ( - ) mood change, ( - ) new changes  All other systems were reviewed with the patient and are negative.  PHYSICAL EXAMINATION: ECOG PERFORMANCE STATUS: 1 - Symptomatic but completely ambulatory  Vitals:   01/28/22 0924  BP: (!) 150/83  Pulse: (!) 56  Resp: 14  Temp: (!) 97.2 F (36.2 C)  SpO2: 99%   Filed Weights   01/28/22 0924  Weight: 190 lb 4.8 oz (86.3 kg)    GENERAL: well appearing male in NAD  SKIN: skin color,  texture, turgor are normal, no rashes or significant lesions EYES: conjunctiva are pink and non-injected, sclera clear OROPHARYNX: no exudate, no erythema; lips, buccal mucosa, and tongue normal  NECK: supple, non-tender LYMPH:  no palpable lymphadenopathy in the cervical or supraclavicular lymph nodes.  LUNGS: clear to auscultation and percussion with normal breathing effort HEART: regular rate & rhythm and no murmurs. Mild edema in left lower leg.  ABDOMEN: soft, non-tender, non-distended, normal bowel sounds Musculoskeletal: no cyanosis of digits and no clubbing  PSYCH: alert & oriented x 3, fluent speech NEURO: no focal motor/sensory deficits  LABORATORY DATA:  I have reviewed the data as listed    Latest Ref Rng & Units 10/11/2021    3:41 AM 10/10/2021    2:02 AM 10/09/2021    3:23 PM  CBC  WBC 4.0 - 10.5 K/uL 7.0  9.2  7.6   Hemoglobin 13.0 - 17.0 g/dL 9.5  9.0  7.9   Hematocrit 39.0 - 52.0 % 27.2  27.1  23.1   Platelets 150 - 400 K/uL 158  122  86        Latest Ref Rng & Units 10/11/2021    3:41 AM 10/10/2021    2:02 AM 10/09/2021    1:32 AM  CMP  Glucose 70 - 99 mg/dL 94  96  100   BUN 8 - 23 mg/dL '15  20  21   '$ Creatinine 0.61 - 1.24 mg/dL 1.56  1.59  1.59   Sodium 135 - 145 mmol/L 140  140  140   Potassium 3.5 - 5.1 mmol/L 3.9  3.9  3.9   Chloride 98 - 111 mmol/L 111  115  115   CO2 22 - 32 mmol/L '22  19  20   '$ Calcium 8.9 - 10.3 mg/dL 9.1  8.7  8.6   Total Protein 6.5 - 8.1 g/dL 5.5  5.3   5.0   Total Bilirubin 0.3 - 1.2 mg/dL 1.7  1.1  0.8   Alkaline Phos 38 - 126 U/L 65  62  59   AST 15 - 41 U/L 36  31  20   ALT 0 - 44 U/L '29  23  19     '$ ASSESSMENT & PLAN Douglas Hoffman is a 76 y.o. male who presents to the hematology clinic for evaluation for leukocytosis and recurrent DVT/PE.  Patient is currently on Eliquis therapy twice daily as prescribed without any prohibitive toxicities.   Discussed possible provoking factors can cause venous thromboembolisms including prolonged travel/immobility, surgery (particular abdominal or orthropedic), trauma,  and pregnancy/ estrogen containing birth control. After a detailed history and review of the records there is no clear provoking factor for this patient's recent massive pulmonary embolism.  Patients with unprovoked VTEs have up to 25% recurrence after 5 years and 36% at 10 years, with 4% of these clots being fatal (BMJ [phone]). Therefore the formal recommendation for unprovoked VTE's is lifelong anticoagulation, as the cause may not be transient or reversible. We recommend 6 months or full strength anticoagulation with a re-evaluation after that time.  The patient's will then have a choice of maintenance dose DOAC (preferred, recommended), '81mg'$  ASA PO daily (non-preferred), or no further anticoagulation (not recommended).   #Recurrent DVT/PE. --First episode in 2013 treated with warfarin, duration unknown --Second episode in June 2023, now on Eliqius. Felt to be unprovoked. --will order baseline CMP and CBC to assure labs are adequate for DOAC therapy --check for antiphospholipid syndrome with cardiolipin antibodies and beta-2 glycoprotein  antibodies. --recommend the patient continue eliquis '5mg'$  BID --patient denies any bleeding, bruising, or dark stools on this medication. It is well tolerated. No difficulties accessing/affording the medication --RTC in 3 months' time with strict return precautions for overt signs of bleeding.    #Leukocytosis: -- Suspect inflammatory process secondary to hospitalization for submassive PE in June 2023. -- Repeat CBC today and check sedimentation rate and C-reactive protein. -- Consider further work-up if leukocytosis does not improve by checking MPN panel and BCR/ABL FISH.   Orders Placed This Encounter  Procedures   CBC with Differential ([place] Only)    Standing Status:   Future    Number of Occurrences:   1    Standing Expiration Date:   01/29/2023   CMP ([place] only)    Standing Status:   Future    Number of Occurrences:   1    Standing Expiration Date:   01/29/2023   Sedimentation rate    Standing Status:   Future    Number of Occurrences:   1    Standing Expiration Date:   01/28/2023   C-reactive protein    Standing Status:   Future    Number of Occurrences:   1    Standing Expiration Date:   01/28/2023   Beta-2-glycoprotein i abs, IgG/M/A    Standing Status:   Future    Number of Occurrences:   1    Standing Expiration Date:   01/28/2023   Cardiolipin antibodies, IgG, IgM, IgA*    Standing Status:   Future    Number of Occurrences:   1    Standing Expiration Date:   01/28/2023    All questions were answered. The patient knows to call the clinic with any problems, questions or concerns.  I have spent a total of 60 minutes minutes of face-to-face and non-face-to-face time, preparing to see the patient, performing a medically appropriate examination, counseling and educating the patient, ordering tests/procedures,  documenting clinical information in the electronic health record, and care coordination.   Dede Query, PA-C Department of Hematology/Oncology [place] at [facility] Phone: [phone]  Patient was seen with Dr. Lorenso Courier  I have read the above note and personally examined the patient. I agree with the assessment and plan as noted above.  Briefly Douglas Hoffman is a 76 year old male who presents for evaluation of  recurrent VTE's.  His most recent DVT was unprovoked.  As such would recommend indefinite anticoagulation.  Would recommend full-strength Eliquis 5 mg twice daily for the next 6 months with consideration of decrease to maintenance dosing 2.5 mg twice daily at the end of that time.  I would continue this indefinitely.  Additionally the patient had a leukocytosis which appeared to be of neutrophilic predominance.  Most likely transient etiology will recheck levels today.  Plan to see the patient back at the 57-monthmark in order to discuss anticoagulation moving forward.  Mr. BAustadvoiced understanding of the plan moving forward.   JLedell Peoples MD Department [facility]ology[facility]Phone: [phone]: [phone]: [email or web address]

## 2022-01-28 ENCOUNTER — Inpatient Hospital Stay: Payer: PPO | Attending: Physician Assistant | Admitting: Physician Assistant

## 2022-01-28 ENCOUNTER — Other Ambulatory Visit: Payer: Self-pay

## 2022-01-28 ENCOUNTER — Encounter: Payer: Self-pay | Admitting: Physician Assistant

## 2022-01-28 ENCOUNTER — Inpatient Hospital Stay: Payer: PPO

## 2022-01-28 VITALS — BP 150/83 | HR 56 | Temp 97.2°F | Resp 14 | Wt 190.3 lb

## 2022-01-28 DIAGNOSIS — Z86711 Personal history of pulmonary embolism: Secondary | ICD-10-CM

## 2022-01-28 DIAGNOSIS — D72828 Other elevated white blood cell count: Secondary | ICD-10-CM

## 2022-01-28 DIAGNOSIS — Z7901 Long term (current) use of anticoagulants: Secondary | ICD-10-CM

## 2022-01-28 DIAGNOSIS — I2699 Other pulmonary embolism without acute cor pulmonale: Secondary | ICD-10-CM

## 2022-01-28 DIAGNOSIS — Z86718 Personal history of other venous thrombosis and embolism: Secondary | ICD-10-CM

## 2022-01-28 DIAGNOSIS — D72829 Elevated white blood cell count, unspecified: Secondary | ICD-10-CM | POA: Insufficient documentation

## 2022-01-28 DIAGNOSIS — Z8546 Personal history of malignant neoplasm of prostate: Secondary | ICD-10-CM

## 2022-01-28 DIAGNOSIS — I10 Essential (primary) hypertension: Secondary | ICD-10-CM

## 2022-01-28 LAB — CMP (CANCER CENTER ONLY)
ALT: 30 U/L (ref 0–44)
AST: 27 U/L (ref 15–41)
Albumin: 4.3 g/dL (ref 3.5–5.0)
Alkaline Phosphatase: 105 U/L (ref 38–126)
Anion gap: 7 (ref 5–15)
BUN: 23 mg/dL (ref 8–23)
CO2: 28 mmol/L (ref 22–32)
Calcium: 9.8 mg/dL (ref 8.9–10.3)
Chloride: 105 mmol/L (ref 98–111)
Creatinine: 1.52 mg/dL — ABNORMAL HIGH (ref 0.61–1.24)
GFR, Estimated: 47 mL/min — ABNORMAL LOW (ref >60)
Glucose, Bld: 97 mg/dL (ref 70–99)
Potassium: 3.7 mmol/L (ref 3.5–5.1)
Sodium: 140 mmol/L (ref 135–145)
Total Bilirubin: 0.7 mg/dL (ref 0.3–1.2)
Total Protein: 7.9 g/dL (ref 6.5–8.1)

## 2022-01-28 LAB — CBC WITH DIFFERENTIAL (CANCER CENTER ONLY)
Abs Immature Granulocytes: 0.16 10*3/uL — ABNORMAL HIGH (ref 0.00–0.07)
Basophils Absolute: 0.1 10*3/uL (ref 0.0–0.1)
Basophils Relative: 1 %
Eosinophils Absolute: 0.1 10*3/uL (ref 0.0–0.5)
Eosinophils Relative: 1 %
HCT: 42.6 % (ref 39.0–52.0)
Hemoglobin: 14.4 g/dL (ref 13.0–17.0)
Immature Granulocytes: 2 %
Lymphocytes Relative: 16 %
Lymphs Abs: 1.7 10*3/uL (ref 0.7–4.0)
MCH: 30.9 pg (ref 26.0–34.0)
MCHC: 33.8 g/dL (ref 30.0–36.0)
MCV: 91.4 fL (ref 80.0–100.0)
Monocytes Absolute: 0.9 10*3/uL (ref 0.1–1.0)
Monocytes Relative: 9 %
Neutro Abs: 7.4 10*3/uL (ref 1.7–7.7)
Neutrophils Relative %: 71 %
Platelet Count: 187 10*3/uL (ref 150–400)
RBC: 4.66 MIL/uL (ref 4.22–5.81)
RDW: 13.3 % (ref 11.5–15.5)
WBC Count: 10.3 10*3/uL (ref 4.0–10.5)
nRBC: 0 % (ref 0.0–0.2)

## 2022-01-28 LAB — SEDIMENTATION RATE: Sed Rate: 20 mm/hr — ABNORMAL HIGH (ref 0–16)

## 2022-01-28 LAB — C-REACTIVE PROTEIN: CRP: 1.4 mg/dL — ABNORMAL HIGH (ref <1.0)

## 2022-01-29 ENCOUNTER — Ambulatory Visit: Payer: PPO | Admitting: Pulmonary Disease

## 2022-01-30 LAB — BETA-2-GLYCOPROTEIN I ABS, IGG/M/A
Beta-2 Glyco I IgG: <9 GPI IgG units (ref 0–20)
Beta-2-Glycoprotein I IgA: <9 GPI IgA units (ref 0–25)
Beta-2-Glycoprotein I IgM: <9 GPI IgM units (ref 0–32)

## 2022-02-01 LAB — CARDIOLIPIN ANTIBODIES, IGG, IGM, IGA
Anticardiolipin IgA: <9 APL U/mL (ref 0–11)
Anticardiolipin IgG: <9 GPL U/mL (ref 0–14)
Anticardiolipin IgM: <9 MPL U/mL (ref 0–12)

## 2022-02-07 DIAGNOSIS — Z23 Encounter for immunization: Secondary | ICD-10-CM | POA: Diagnosis not present

## 2022-02-12 ENCOUNTER — Encounter: Payer: Self-pay | Admitting: Physician Assistant

## 2022-02-12 ENCOUNTER — Encounter: Payer: Self-pay | Admitting: Pulmonary Disease

## 2022-02-12 ENCOUNTER — Ambulatory Visit (INDEPENDENT_AMBULATORY_CARE_PROVIDER_SITE_OTHER): Payer: PPO | Admitting: Pulmonary Disease

## 2022-02-12 VITALS — BP 120/66 | HR 69 | Temp 98.6°F | Wt 193.0 lb

## 2022-02-12 DIAGNOSIS — I2699 Other pulmonary embolism without acute cor pulmonale: Secondary | ICD-10-CM

## 2022-02-12 NOTE — Progress Notes (Signed)
$'@Patient'o$  ID: Douglas Hoffman, male    DOB: 10-Apr-1946, 76 y.o.   MRN: [id number]  Chief Complaint  Patient presents with   Follow-up    Pt is here for follow up for PE. Pt states no issues noted on the eliquis. Pt does states it is a little pricey for him. No issues noted with BP with on the amlodipine thus far.     Referring provider: [name], MD  HPI:   76 y.o. man whom we are seeing in follow-up after massive PE.  Most recent hematology note reviewed.  He is doing well.  No issues with dyspnea.  Adherent to Eliquis 5 mg twice daily.  No hematochezia, melena, hematemesis.  No risky activities.  He states Eliquis is gotten expensive here later in the year.  Suspects the donut hole.  Said that the hematology office asked him to reach out to them if cost became more of an issue.  I encouraged him to do so.  Offered him any fraction assistance but he declined today.  Again, reviewed TTE after catheter directed lytics that showed resolution of RV dysfunction which is reassuring.  HPI at initial visit Overall doing well.  Admitted to the hospital with progressive shortness of breath and chest pain.  Found to have large clot burden on CTA chest.  Reported syncope at home.  Underwent catheter directed lytics 10/06/21.  TTE 10/06/2021 demonstrated RV dysfunction with elevated right-sided pressures.  Had repeat TTE 10/09/2021 that showed resolution of RV dysfunction, normal RV function, normal estimated right-sided pressures.  Some confusion about apixaban dosing.  Completed the 2 tablet twice daily for a week.  Only been taking 1 tablet of 5 mg daily.  Not twice daily as prescribed.  This was clarified he understands, will take twice a day.  No issues with bleeding.  No melena or hematochezia.  No coffee-ground emesis etc.  No easy bruising.  No weakness.  Dyspnea are slowly improving.  No chest pain.  All gone.  Questionaires / Pulmonary Flowsheets:   ACT:      No data to display            MMRC:     No data to display           Epworth:      No data to display           Tests:   FENO:  No results found for: "NITRICOXIDE"  PFT:     No data to display           WALK:      No data to display           Imaging: Personally reviewed and as per EMR No results found.  Lab Results: Personally reviewed CBC    Component Value Date/Time   WBC 10.3 01/28/2022 1005   WBC 7.0 10/11/2021 0341   RBC 4.66 01/28/2022 1005   HGB 14.4 01/28/2022 1005   HCT 42.6 01/28/2022 1005   PLT 187 01/28/2022 1005   MCV 91.4 01/28/2022 1005   MCH 30.9 01/28/2022 1005   MCHC 33.8 01/28/2022 1005   RDW 13.3 01/28/2022 1005   LYMPHSABS 1.7 01/28/2022 1005   MONOABS 0.9 01/28/2022 1005   EOSABS 0.1 01/28/2022 1005   BASOSABS 0.1 01/28/2022 1005    BMET    Component Value Date/Time   NA 140 01/28/2022 1005   K 3.7 01/28/2022 1005   CL 105 01/28/2022 1005   CO2 28 01/28/2022 1005  GLUCOSE 97 01/28/2022 1005   BUN 23 01/28/2022 1005   CREATININE 1.52 (H) 01/28/2022 1005   CALCIUM 9.8 01/28/2022 1005   GFRNONAA 47 (L) 01/28/2022 1005   GFRAA 51 (L) 12/05/2018 1643    BNP    Component Value Date/Time   BNP 71.7 10/06/2021 0638    ProBNP No results found for: "PROBNP"  Specialty Problems   None   Allergies  Allergen Reactions   Shellfish Allergy Swelling    Gout     Immunization History  Administered Date(s) Administered   Influenza, High Dose Seasonal PF 02/07/2022   Influenza,inj,quad, With Preservative 02/05/2018   Pneumococcal Polysaccharide-23 10/31/2011    Past Medical History:  Diagnosis Date   Acute venous embolism and thrombosis of unspecified deep vessels of lower extremity    Arthritis    knee    Cancer (HCC)    Clotting disorder (HCC)    DVT many years  ago    Glaucoma    left eye since age 28   Gout    Hip pain    History of radiation therapy    Prostate   Hypertension    Prostate cancer ([place])    s/p  radiation 36 fx   Renal disorder    reports prior to joint replacement surgery , was taking 5 to 6 aleve daily for pain releif, report shas stopped this and kidney function has omporved, kidney fx followed by his PCP       Tobacco History: Social History   Tobacco Use  Smoking Status Never  Smokeless Tobacco Never   Counseling given: Not Answered   Continue to not smoke  Outpatient Encounter Medications as of 02/12/2022  Medication Sig   acetaminophen (TYLENOL) 500 MG tablet Take 1,000 mg by mouth every 6 (six) hours as needed (pain).   amLODipine (NORVASC) 5 MG tablet Take 5 mg by mouth every morning.   apixaban (ELIQUIS) 5 MG TABS tablet Take 1 tablet (5 mg total) by mouth 2 (two) times daily.   atorvastatin (LIPITOR) 80 MG tablet Take 1 tablet (80 mg total) by mouth daily.   triamcinolone cream (KENALOG) 0.1 % Apply 1 application. topically 2 (two) times daily as needed for rash.   apixaban (ELIQUIS) 5 MG TABS tablet Take 2 tablets (10 mg total) by mouth 2 (two) times daily for 6 days.   No facility-administered encounter medications on file as of 02/12/2022.     Review of Systems  Review of Systems  N/a Physical Exam  BP 120/66 (BP Location: Left Arm, Patient Position: Sitting, Cuff Size: Normal)   Pulse 69   Temp 98.6 F (37 C) (Oral)   Wt 193 lb (87.5 kg)   SpO2 96%   BMI 26.18 kg/m   Wt Readings from Last 5 Encounters:  02/12/22 193 lb (87.5 kg)  01/28/22 190 lb 4.8 oz (86.3 kg)  10/29/21 190 lb 12.8 oz (86.5 kg)  10/11/21 195 lb 12.3 oz (88.8 kg)  06/06/19 204 lb (92.5 kg)    BMI Readings from Last 5 Encounters:  02/12/22 26.18 kg/m  01/28/22 25.81 kg/m  10/29/21 25.88 kg/m  10/11/21 26.55 kg/m  06/06/19 27.67 kg/m     Physical Exam General: Well-appearing, no acute distress Eyes: EOMI, icterus Neck: Supple, no JVP Pulmonary: Clear, no work of breathing Cardiovascular warm, no edema MSK: No synovitis, no joint effusion, joint  replacement scars on knees Abdomen: Nondistended, sounds present Neuro: Normal gait, no weakness Psych: Normal mood, full affect.  Assessment & Plan:   Massive PE: With syncope.  Status post catheter directed lytics 10/06/2021.  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  Recommend lifelong anticoagulation which he expresses understanding.  Can continue apixaban 5 mg twice a day.  Denies significant dyspnea.  No chest pain.  Hematology provider message regarding starting with cost.  RV dysfunction: Acute in the setting of massive PE.  Resolved on limited TTE 3 days after lytic therapy.  Normal RV function with normal estimated RVSP.  No further follow-up needed.   Return in about 1 year (around 02/13/2023).   Lanier Clam, MD 02/12/2022

## 2022-02-12 NOTE — Patient Instructions (Addendum)
Nice to see you again  No changes to the Eliquis  Send a message to the hematology PA Dede Query about the cost of Eliquis.  I will also send her message.  Return to clinic in 1 year or sooner as needed with Dr. Silas Flood

## 2022-02-12 NOTE — Progress Notes (Signed)
Received message from RN regarding assistance with Eliquis.  Patient does not have a cancer diagnosis and therefore does not qualify for [employer]. [employer] has an Tourist information centre manager that can be completed by patient and provider and returned directly to them for determination. Emailed application to [employer] and provider and advised via chat.

## 2022-04-30 ENCOUNTER — Other Ambulatory Visit: Payer: Self-pay | Admitting: Physician Assistant

## 2022-04-30 DIAGNOSIS — Z86711 Personal history of pulmonary embolism: Secondary | ICD-10-CM

## 2022-05-01 ENCOUNTER — Inpatient Hospital Stay: Payer: PPO | Attending: Physician Assistant

## 2022-05-01 ENCOUNTER — Inpatient Hospital Stay: Payer: PPO | Admitting: Physician Assistant

## 2022-05-05 ENCOUNTER — Telehealth: Payer: Self-pay | Admitting: Physician Assistant

## 2022-05-05 NOTE — Telephone Encounter (Signed)
Called patient per scheduling message. Left voicemail for patient to call back in regards to the changes that may need to be done to the upcoming appointments.

## 2022-05-06 ENCOUNTER — Telehealth: Payer: Self-pay | Admitting: Hematology and Oncology

## 2022-05-06 NOTE — Telephone Encounter (Signed)
Patient called to r/s appointment on 1/9. Patient r/s and notified.

## 2022-05-12 ENCOUNTER — Ambulatory Visit: Payer: PPO | Admitting: Physician Assistant

## 2022-05-12 ENCOUNTER — Other Ambulatory Visit: Payer: PPO

## 2022-05-13 ENCOUNTER — Other Ambulatory Visit: Payer: Self-pay

## 2022-05-13 ENCOUNTER — Inpatient Hospital Stay: Payer: PPO | Attending: Physician Assistant

## 2022-05-13 ENCOUNTER — Inpatient Hospital Stay (HOSPITAL_BASED_OUTPATIENT_CLINIC_OR_DEPARTMENT_OTHER): Payer: PPO | Admitting: Hematology and Oncology

## 2022-05-13 VITALS — BP 141/83 | HR 78 | Temp 97.9°F | Resp 16 | Ht 72.0 in | Wt 202.1 lb

## 2022-05-13 DIAGNOSIS — Z86711 Personal history of pulmonary embolism: Secondary | ICD-10-CM

## 2022-05-13 DIAGNOSIS — D72828 Other elevated white blood cell count: Secondary | ICD-10-CM | POA: Diagnosis not present

## 2022-05-13 DIAGNOSIS — Z7901 Long term (current) use of anticoagulants: Secondary | ICD-10-CM | POA: Diagnosis not present

## 2022-05-13 DIAGNOSIS — D72829 Elevated white blood cell count, unspecified: Secondary | ICD-10-CM | POA: Diagnosis not present

## 2022-05-13 DIAGNOSIS — Z86718 Personal history of other venous thrombosis and embolism: Secondary | ICD-10-CM | POA: Insufficient documentation

## 2022-05-13 LAB — CMP (CANCER CENTER ONLY)
ALT: 29 U/L (ref 0–44)
AST: 29 U/L (ref 15–41)
Albumin: 4.1 g/dL (ref 3.5–5.0)
Alkaline Phosphatase: 92 U/L (ref 38–126)
Anion gap: 5 (ref 5–15)
BUN: 19 mg/dL (ref 8–23)
CO2: 28 mmol/L (ref 22–32)
Calcium: 10.4 mg/dL — ABNORMAL HIGH (ref 8.9–10.3)
Chloride: 108 mmol/L (ref 98–111)
Creatinine: 1.61 mg/dL — ABNORMAL HIGH (ref 0.61–1.24)
GFR, Estimated: 44 mL/min — ABNORMAL LOW (ref >60)
Glucose, Bld: 96 mg/dL (ref 70–99)
Potassium: 5 mmol/L (ref 3.5–5.1)
Sodium: 141 mmol/L (ref 135–145)
Total Bilirubin: 0.5 mg/dL (ref 0.3–1.2)
Total Protein: 7.3 g/dL (ref 6.5–8.1)

## 2022-05-13 LAB — CBC WITH DIFFERENTIAL (CANCER CENTER ONLY)
Abs Immature Granulocytes: 0.09 10*3/uL — ABNORMAL HIGH (ref 0.00–0.07)
Basophils Absolute: 0.1 10*3/uL (ref 0.0–0.1)
Basophils Relative: 1 %
Eosinophils Absolute: 0.3 10*3/uL (ref 0.0–0.5)
Eosinophils Relative: 4 %
HCT: 41.6 % (ref 39.0–52.0)
Hemoglobin: 13.6 g/dL (ref 13.0–17.0)
Immature Granulocytes: 1 %
Lymphocytes Relative: 20 %
Lymphs Abs: 1.7 10*3/uL (ref 0.7–4.0)
MCH: 31.1 pg (ref 26.0–34.0)
MCHC: 32.7 g/dL (ref 30.0–36.0)
MCV: 95 fL (ref 80.0–100.0)
Monocytes Absolute: 1 10*3/uL (ref 0.1–1.0)
Monocytes Relative: 12 %
Neutro Abs: 5 10*3/uL (ref 1.7–7.7)
Neutrophils Relative %: 62 %
Platelet Count: 163 10*3/uL (ref 150–400)
RBC: 4.38 MIL/uL (ref 4.22–5.81)
RDW: 13.9 % (ref 11.5–15.5)
WBC Count: 8.1 10*3/uL (ref 4.0–10.5)
nRBC: 0 % (ref 0.0–0.2)

## 2022-05-13 NOTE — Progress Notes (Signed)
[facility] [phone]   [phone]  PROGRESS NOTE  Patient Care Team: [name], MD as PCP - General (Internal Medicine)  Hematological/Oncological History #Leukocytosis: -10/01/2021: WBC 8.52, Hgb 14.4, Plt 139 (L),  -10/16/2021: WBC 16.68 (H), Hgb 10.8( L), MCV 97.4 (H), Plt 265K, ANC 12.4 (H) -11/21/2021: WBC 14.28 (H), Hgb 12.1, Plt 165K, ANC 11.1 (H) -01/28/2022: establish care with Dr. Lorenso Courier. Leukocytosis resolved.    #DVT/PE: --12/22/2010: DVT in the distal femoral and popliteal veins. Treated with IV herapin then switched to lovenox therapy as bridge for coumadin. Uncertain length of treatment but was on coumadin until June 2013.  --10/06/2021: Submassive large clot main pulmonary artery PE status post IR directed catheter thrombolysis. Transitioned to Eliquis therapy --01/28/2022: establish care with Dr. Lorenso Courier.  Interval History:  Douglas Hoffman 77 y.o. male with medical history significant for DVT/PE presents for a follow up visit. The patient's last visit was on 01/28/2022 at which time he established care. In the interim since the last visit he has continued on eliquis and his leukocytosis resolved.   On exam today Douglas Hoffman reports he has been well in the interim since our last visit.  He notes he had a good holiday season.  He is taking Eliquis faithfully 5 mg twice daily.  He is not having any trouble with bleeding, bruising, or dark stools.  He notes that the cost is a bit high for him but it works out to approximately $100 for a 39-monthsupply.  He notes he is not having any chest pain, shortness of breath, or leg pain.  He notes his energy is good and overall he feels well.  His appetite is strong.  He denies any fevers, chills, sweats, nausea, vomiting or diarrhea.  A full 10 point ROS was otherwise negative.  MEDICAL HISTORY:  Past Medical History:  Diagnosis Date   Acute venous embolism and thrombosis of unspecified deep vessels  of lower extremity    Arthritis    knee    Cancer (HCC)    Clotting disorder (HCC)    DVT many years  ago    Glaucoma    left eye since age 77  Gout    Hip pain    History of radiation therapy    Prostate   Hypertension    Prostate cancer ([facility]    s/p radiation 36 fx   Renal disorder    reports prior to joint replacement surgery , was taking 5 to 6 aleve daily for pain releif, report shas stopped this and kidney function has omporved, kidney fx followed by his PCP       SURGICAL HISTORY: Past Surgical History:  Procedure Laterality Date   COLONOSCOPY  08/14/2010   normal    IR ANGIOGRAM PULMONARY BILATERAL SELECTIVE  10/06/2021   IR ANGIOGRAM SELECTIVE EACH ADDITIONAL VESSEL  10/06/2021   IR ANGIOGRAM SELECTIVE EACH ADDITIONAL VESSEL  10/06/2021   IR INFUSION THROMBOL ARTERIAL INITIAL (MS)  10/06/2021   IR INFUSION THROMBOL VENOUS INITIAL (MS)  10/06/2021   IR THROMB F/U EVAL ART/VEN FINAL DAY (MS)  10/07/2021   IR [place] VASC ACCESS RIGHT  10/06/2021   JOINT REPLACEMENT     RIGHT Hip Replacement   JOINT REPLACEMENT     Knee Replacement   SUPRAPUBIC PROSTATECTOMY     TOTAL KNEE REVISION Left 07/07/2018   Procedure: TOTAL KNEE REVISION LEFT KNEE ARTHROPLASTY WITH FEMORAL AND TIBIAL COMPONENTS;  Surgeon: SRod Can MD;  Location:  [facility];  Service: Orthopedics;  Laterality: Left;    SOCIAL HISTORY: Social History   Socioeconomic History   Marital status: Married    Spouse name: Not on file   Number of children: Not on file   Years of education: Not on file   Highest education level: Not on file  Occupational History   Not on file  Tobacco Use   Smoking status: Never   Smokeless tobacco: Never  Vaping Use   Vaping Use: Never used  Substance and Sexual Activity   Alcohol use: No   Drug use: No   Sexual activity: Not on file  Other Topics Concern   Not on file  Social History Narrative   Not on file   Social Determinants of Health   Financial Resource Strain: Not  on file  Food Insecurity: Not on file  Transportation Needs: Not on file  Physical Activity: Not on file  Stress: Not on file  Social Connections: Not on file  Intimate Partner Violence: Not on file    FAMILY HISTORY: Family History  Problem Relation Age of Onset   Colon cancer Neg Hx    Colon polyps Neg Hx    Esophageal cancer Neg Hx    Rectal cancer Neg Hx    Stomach cancer Neg Hx     ALLERGIES:  is allergic to shellfish allergy.  MEDICATIONS:  Current Outpatient Medications  Medication Sig Dispense Refill   acetaminophen (TYLENOL) 500 MG tablet Take 1,000 mg by mouth every 6 (six) hours as needed (pain).     amLODipine (NORVASC) 5 MG tablet Take 5 mg by mouth every morning.     apixaban (ELIQUIS) 5 MG TABS tablet Take 1 tablet (5 mg total) by mouth 2 (two) times daily. 60 tablet 11   atorvastatin (LIPITOR) 80 MG tablet Take 1 tablet (80 mg total) by mouth daily. 30 tablet 11   triamcinolone cream (KENALOG) 0.1 % Apply 1 application. topically 2 (two) times daily as needed for rash.     No current facility-administered medications for this visit.    REVIEW OF SYSTEMS:   Constitutional: ( - ) fevers, ( - )  chills , ( - ) night sweats Eyes: ( - ) blurriness of vision, ( - ) double vision, ( - ) watery eyes Ears, nose, mouth, throat, and face: ( - ) mucositis, ( - ) sore throat Respiratory: ( - ) cough, ( - ) dyspnea, ( - ) wheezes Cardiovascular: ( - ) palpitation, ( - ) chest discomfort, ( - ) lower extremity swelling Gastrointestinal:  ( - ) nausea, ( - ) heartburn, ( - ) change in bowel habits Skin: ( - ) abnormal skin rashes Lymphatics: ( - ) new lymphadenopathy, ( - ) easy bruising Neurological: ( - ) numbness, ( - ) tingling, ( - ) new weaknesses Behavioral/Psych: ( - ) mood change, ( - ) new changes  All other systems were reviewed with the patient and are negative.  PHYSICAL EXAMINATION:  Vitals:   05/13/22 0933  BP: (!) 141/83  Pulse: 78  Resp: 16  Temp:  97.9 F (36.6 C)  SpO2: 100%   Filed Weights   05/13/22 0933  Weight: 202 lb 1.6 oz (91.7 kg)    GENERAL: alert, no distress and comfortable SKIN: skin color, texture, turgor are normal, no rashes or significant lesions EYES: conjunctiva are pink and non-injected, sclera clear OROPHARYNX: no exudate, no erythema; lips, buccal mucosa, and tongue normal  NECK: supple,  non-tender LYMPH:  no palpable lymphadenopathy in the cervical, axillary or inguinal LUNGS: clear to auscultation and percussion with normal breathing effort HEART: regular rate & rhythm and no murmurs and no lower extremity edema ABDOMEN: soft, non-tender, non-distended, normal bowel sounds Musculoskeletal: no cyanosis of digits and no clubbing  PSYCH: alert & oriented x 3, fluent speech NEURO: no focal motor/sensory deficits  LABORATORY DATA:  I have reviewed the data as listed    Latest Ref Rng & Units 05/13/2022    9:17 AM 01/28/2022   10:05 AM 10/11/2021    3:41 AM  CBC  WBC 4.0 - 10.5 K/uL 8.1  10.3  7.0   Hemoglobin 13.0 - 17.0 g/dL 13.6  14.4  9.5   Hematocrit 39.0 - 52.0 % 41.6  42.6  27.2   Platelets 150 - 400 K/uL 163  187  158        Latest Ref Rng & Units 05/13/2022    9:17 AM 01/28/2022   10:05 AM 10/11/2021    3:41 AM  CMP  Glucose 70 - 99 mg/dL 96  97  94   BUN 8 - 23 mg/dL '19  23  15   '$ Creatinine 0.61 - 1.24 mg/dL 1.61  1.52  1.56   Sodium 135 - 145 mmol/L 141  140  140   Potassium 3.5 - 5.1 mmol/L 5.0  3.7  3.9   Chloride 98 - 111 mmol/L 108  105  111   CO2 22 - 32 mmol/L '28  28  22   '$ Calcium 8.9 - 10.3 mg/dL 10.4  9.8  9.1   Total Protein 6.5 - 8.1 g/dL 7.3  7.9  5.5   Total Bilirubin 0.3 - 1.2 mg/dL 0.5  0.7  1.7   Alkaline Phos 38 - 126 U/L 92  105  65   AST 15 - 41 U/L 29  27  36   ALT 0 - 44 U/L '29  30  29    '$ RADIOGRAPHIC STUDIES: No results found.  ASSESSMENT & PLAN Douglas Hoffman 77 y.o. male with medical history significant for DVT/PE presents for a follow up visit.    Discussed possible provoking factors can cause venous thromboembolisms including prolonged travel/immobility, surgery (particular abdominal or orthropedic), trauma,  and pregnancy/ estrogen containing birth control. After a detailed history and review of the records there is no clear provoking factor for this patient's recent massive pulmonary embolism.  Patients with unprovoked VTEs have up to 25% recurrence after 5 years and 36% at 10 years, with 4% of these clots being fatal (BMJ [phone]). Therefore the formal recommendation for unprovoked VTE's is lifelong anticoagulation, as the cause may not be transient or reversible. We recommend 6 months or full strength anticoagulation with a re-evaluation after that time.  The patient's will then have a choice of maintenance dose DOAC (preferred, recommended), '81mg'$  ASA PO daily (non-preferred), or no further anticoagulation (not recommended).    #Recurrent DVT/PE. --First episode in 2013 treated with warfarin, duration unknown --Second episode in June 2023, now on Eliqius. Felt to be unprovoked. --will order CMP and CBC at each visit to assure labs are adequate for DOAC therapy --Today show white blood cell count 8.1, hemoglobin 13.6, MCV 95, and platelets of 163 with creatinine of 1.61. --recommend the patient continue eliquis '5mg'$  BID.  Offered transition to maintenance dosing but patient would like to continue on full-strength. --patient denies any bleeding, bruising, or dark stools on this medication. It is well tolerated. No difficulties  accessing/affording the medication --RTC in 6 months' time with strict return precautions for overt signs of bleeding.    #Leukocytosis, resolved: -- Suspect inflammatory process secondary to hospitalization for submassive PE in June 2023. -- resolved, continue to monitor   No orders of the defined types were placed in this encounter.   All questions were answered. The patient knows to call the clinic with  any problems, questions or concerns.  A total of more than 30 minutes were spent on this encounter with face-to-face time and non-face-to-face time, including preparing to see the patient, ordering tests and/or medications, counseling the patient and coordination of care as outlined above.   Ledell Peoples, MD Department of Hematology/Oncology [facility] Phone: [phone] Pager: [phone] Email: [email or web address]  05/13/2022 10:27 AM

## 2022-07-09 DIAGNOSIS — D689 Coagulation defect, unspecified: Secondary | ICD-10-CM | POA: Diagnosis not present

## 2022-07-09 DIAGNOSIS — E663 Overweight: Secondary | ICD-10-CM | POA: Diagnosis not present

## 2022-07-09 DIAGNOSIS — H259 Unspecified age-related cataract: Secondary | ICD-10-CM | POA: Diagnosis not present

## 2022-07-09 DIAGNOSIS — I129 Hypertensive chronic kidney disease with stage 1 through stage 4 chronic kidney disease, or unspecified chronic kidney disease: Secondary | ICD-10-CM | POA: Diagnosis not present

## 2022-07-09 DIAGNOSIS — I10 Essential (primary) hypertension: Secondary | ICD-10-CM | POA: Diagnosis not present

## 2022-07-09 DIAGNOSIS — M109 Gout, unspecified: Secondary | ICD-10-CM | POA: Diagnosis not present

## 2022-07-09 DIAGNOSIS — M1990 Unspecified osteoarthritis, unspecified site: Secondary | ICD-10-CM | POA: Diagnosis not present

## 2022-07-09 DIAGNOSIS — Z86711 Personal history of pulmonary embolism: Secondary | ICD-10-CM | POA: Diagnosis not present

## 2022-07-09 DIAGNOSIS — E785 Hyperlipidemia, unspecified: Secondary | ICD-10-CM | POA: Diagnosis not present

## 2022-07-09 DIAGNOSIS — N1831 Chronic kidney disease, stage 3a: Secondary | ICD-10-CM | POA: Diagnosis not present

## 2022-07-09 DIAGNOSIS — H548 Legal blindness, as defined in USA: Secondary | ICD-10-CM | POA: Diagnosis not present

## 2022-07-09 DIAGNOSIS — G8929 Other chronic pain: Secondary | ICD-10-CM | POA: Diagnosis not present

## 2022-09-24 DIAGNOSIS — N184 Chronic kidney disease, stage 4 (severe): Secondary | ICD-10-CM | POA: Diagnosis not present

## 2022-09-24 DIAGNOSIS — I129 Hypertensive chronic kidney disease with stage 1 through stage 4 chronic kidney disease, or unspecified chronic kidney disease: Secondary | ICD-10-CM | POA: Diagnosis not present

## 2022-09-24 DIAGNOSIS — M25512 Pain in left shoulder: Secondary | ICD-10-CM | POA: Diagnosis not present

## 2022-09-24 DIAGNOSIS — G589 Mononeuropathy, unspecified: Secondary | ICD-10-CM | POA: Diagnosis not present

## 2022-10-12 DIAGNOSIS — Z8546 Personal history of malignant neoplasm of prostate: Secondary | ICD-10-CM | POA: Diagnosis not present

## 2022-10-12 DIAGNOSIS — R351 Nocturia: Secondary | ICD-10-CM | POA: Diagnosis not present

## 2022-10-12 DIAGNOSIS — R3915 Urgency of urination: Secondary | ICD-10-CM | POA: Diagnosis not present

## 2022-10-19 DIAGNOSIS — M109 Gout, unspecified: Secondary | ICD-10-CM | POA: Diagnosis not present

## 2022-10-19 DIAGNOSIS — D72829 Elevated white blood cell count, unspecified: Secondary | ICD-10-CM | POA: Diagnosis not present

## 2022-10-19 DIAGNOSIS — D649 Anemia, unspecified: Secondary | ICD-10-CM | POA: Diagnosis not present

## 2022-10-19 DIAGNOSIS — I10 Essential (primary) hypertension: Secondary | ICD-10-CM | POA: Diagnosis not present

## 2022-10-19 DIAGNOSIS — Z125 Encounter for screening for malignant neoplasm of prostate: Secondary | ICD-10-CM | POA: Diagnosis not present

## 2022-10-19 DIAGNOSIS — E291 Testicular hypofunction: Secondary | ICD-10-CM | POA: Diagnosis not present

## 2022-10-19 DIAGNOSIS — R7989 Other specified abnormal findings of blood chemistry: Secondary | ICD-10-CM | POA: Diagnosis not present

## 2022-10-26 DIAGNOSIS — D692 Other nonthrombocytopenic purpura: Secondary | ICD-10-CM | POA: Diagnosis not present

## 2022-10-26 DIAGNOSIS — M109 Gout, unspecified: Secondary | ICD-10-CM | POA: Diagnosis not present

## 2022-10-26 DIAGNOSIS — R351 Nocturia: Secondary | ICD-10-CM | POA: Diagnosis not present

## 2022-10-26 DIAGNOSIS — Z23 Encounter for immunization: Secondary | ICD-10-CM | POA: Diagnosis not present

## 2022-10-26 DIAGNOSIS — R82998 Other abnormal findings in urine: Secondary | ICD-10-CM | POA: Diagnosis not present

## 2022-10-26 DIAGNOSIS — Z0000 Encounter for general adult medical examination without abnormal findings: Secondary | ICD-10-CM | POA: Diagnosis not present

## 2022-10-26 DIAGNOSIS — N1832 Chronic kidney disease, stage 3b: Secondary | ICD-10-CM | POA: Diagnosis not present

## 2022-10-26 DIAGNOSIS — M25512 Pain in left shoulder: Secondary | ICD-10-CM | POA: Diagnosis not present

## 2022-10-26 DIAGNOSIS — E291 Testicular hypofunction: Secondary | ICD-10-CM | POA: Diagnosis not present

## 2022-10-26 DIAGNOSIS — D6869 Other thrombophilia: Secondary | ICD-10-CM | POA: Diagnosis not present

## 2022-10-26 DIAGNOSIS — Z1339 Encounter for screening examination for other mental health and behavioral disorders: Secondary | ICD-10-CM | POA: Diagnosis not present

## 2022-10-26 DIAGNOSIS — I129 Hypertensive chronic kidney disease with stage 1 through stage 4 chronic kidney disease, or unspecified chronic kidney disease: Secondary | ICD-10-CM | POA: Diagnosis not present

## 2022-10-26 DIAGNOSIS — Z1331 Encounter for screening for depression: Secondary | ICD-10-CM | POA: Diagnosis not present

## 2022-10-26 DIAGNOSIS — G589 Mononeuropathy, unspecified: Secondary | ICD-10-CM | POA: Diagnosis not present

## 2022-10-26 DIAGNOSIS — R21 Rash and other nonspecific skin eruption: Secondary | ICD-10-CM | POA: Diagnosis not present

## 2022-10-26 DIAGNOSIS — Z86711 Personal history of pulmonary embolism: Secondary | ICD-10-CM | POA: Diagnosis not present

## 2022-11-10 ENCOUNTER — Other Ambulatory Visit: Payer: Self-pay | Admitting: Hematology and Oncology

## 2022-11-10 ENCOUNTER — Telehealth: Payer: Self-pay | Admitting: Hematology and Oncology

## 2022-11-10 DIAGNOSIS — Z86711 Personal history of pulmonary embolism: Secondary | ICD-10-CM

## 2022-11-10 NOTE — Progress Notes (Signed)
[facility] [phone]   [phone]  PROGRESS NOTE  Patient Care Team: Alysia Penna, MD as PCP - General (Internal Medicine)  Hematological/Oncological History #Leukocytosis: -10/01/2021: WBC 8.52, Hgb 14.4, Plt 139 (L),  -10/16/2021: WBC 16.68 (H), Hgb 10.8( L), MCV 97.4 (H), Plt 265K, ANC 12.4 (H) -11/21/2021: WBC 14.28 (H), Hgb 12.1, Plt 165K, ANC 11.1 (H) -01/28/2022: establish care with Dr. Leonides Schanz. Leukocytosis resolved.    #DVT/PE: --12/22/2010: DVT in the distal femoral and popliteal veins. Treated with IV herapin then switched to lovenox therapy as bridge for coumadin. Uncertain length of treatment but was on coumadin until June 2013.  --10/06/2021: Submassive large clot main pulmonary artery PE status post IR directed catheter thrombolysis. Transitioned to Eliquis therapy --01/28/2022: establish care with Dr. Leonides Schanz.  Interval History:  Douglas Hoffman 77 y.o. male with medical history significant for DVT/PE presents for a follow up visit. The patient's last visit was on 05/13/2022 at which time he established care. In the interim since the last visit he has continued on eliquis and his leukocytosis resolved.   On exam today Douglas Hoffman reports *** He denies any fevers, chills, sweats, nausea, vomiting or diarrhea.  A full 10 point ROS was otherwise negative.  MEDICAL HISTORY:  Past Medical History:  Diagnosis Date   Acute venous embolism and thrombosis of unspecified deep vessels of lower extremity    Arthritis    knee    Cancer (HCC)    Clotting disorder (HCC)    DVT many years  ago    Glaucoma    left eye since age 9   Gout    Hip pain    History of radiation therapy    Prostate   Hypertension    Prostate cancer [facility])    s/p radiation 36 fx   Renal disorder    reports prior to joint replacement surgery , was taking 5 to 6 aleve daily for pain releif, report shas stopped this and kidney function has omporved, kidney fx followed by  his PCP       SURGICAL HISTORY: Past Surgical History:  Procedure Laterality Date   COLONOSCOPY  08/14/2010   normal    IR ANGIOGRAM PULMONARY BILATERAL SELECTIVE  10/06/2021   IR ANGIOGRAM SELECTIVE EACH ADDITIONAL VESSEL  10/06/2021   IR ANGIOGRAM SELECTIVE EACH ADDITIONAL VESSEL  10/06/2021   IR INFUSION THROMBOL ARTERIAL INITIAL (MS)  10/06/2021   IR INFUSION THROMBOL VENOUS INITIAL (MS)  10/06/2021   IR THROMB F/U EVAL ART/VEN FINAL DAY (MS)  10/07/2021   IR US GUIDE VASC ACCESS RIGHT  10/06/2021   JOINT REPLACEMENT     RIGHT Hip Replacement   JOINT REPLACEMENT     Knee Replacement   SUPRAPUBIC PROSTATECTOMY     TOTAL KNEE REVISION Left 07/07/2018   Procedure: TOTAL KNEE REVISION LEFT KNEE ARTHROPLASTY WITH FEMORAL AND TIBIAL COMPONENTS;  Surgeon: Samson Frederic, MD;  Location: [facility];  Service: Orthopedics;  Laterality: Left;    SOCIAL HISTORY: Social History   Socioeconomic History   Marital status: Married    Spouse name: Not on file   Number of children: Not on file   Years of education: Not on file   Highest education level: Not on file  Occupational History   Not on file  Tobacco Use   Smoking status: Never   Smokeless tobacco: Never  Vaping Use   Vaping Use: Never used  Substance and Sexual Activity   Alcohol use: No   Drug  use: No   Sexual activity: Not on file  Other Topics Concern   Not on file  Social History Narrative   Not on file   Social Determinants of Health   Financial Resource Strain: Not on file  Food Insecurity: Not on file  Transportation Needs: Not on file  Physical Activity: Not on file  Stress: Not on file  Social Connections: Not on file  Intimate Partner Violence: Not on file    FAMILY HISTORY: Family History  Problem Relation Age of Onset   Colon cancer Neg Hx    Colon polyps Neg Hx    Esophageal cancer Neg Hx    Rectal cancer Neg Hx    Stomach cancer Neg Hx     ALLERGIES:  is allergic to shellfish allergy.  MEDICATIONS:   Current Outpatient Medications  Medication Sig Dispense Refill   acetaminophen (TYLENOL) 500 MG tablet Take 1,000 mg by mouth every 6 (six) hours as needed (pain).     amLODipine (NORVASC) 5 MG tablet Take 5 mg by mouth every morning.     apixaban (ELIQUIS) 5 MG TABS tablet Take 1 tablet (5 mg total) by mouth 2 (two) times daily. 60 tablet 11   atorvastatin (LIPITOR) 80 MG tablet Take 1 tablet (80 mg total) by mouth daily. 30 tablet 11   triamcinolone cream (KENALOG) 0.1 % Apply 1 application. topically 2 (two) times daily as needed for rash.     No current facility-administered medications for this visit.    REVIEW OF SYSTEMS:   Constitutional: ( - ) fevers, ( - )  chills , ( - ) night sweats Eyes: ( - ) blurriness of vision, ( - ) double vision, ( - ) watery eyes Ears, nose, mouth, throat, and face: ( - ) mucositis, ( - ) sore throat Respiratory: ( - ) cough, ( - ) dyspnea, ( - ) wheezes Cardiovascular: ( - ) palpitation, ( - ) chest discomfort, ( - ) lower extremity swelling Gastrointestinal:  ( - ) nausea, ( - ) heartburn, ( - ) change in bowel habits Skin: ( - ) abnormal skin rashes Lymphatics: ( - ) new lymphadenopathy, ( - ) easy bruising Neurological: ( - ) numbness, ( - ) tingling, ( - ) new weaknesses Behavioral/Psych: ( - ) mood change, ( - ) new changes  All other systems were reviewed with the patient and are negative.  PHYSICAL EXAMINATION:  There were no vitals filed for this visit.  There were no vitals filed for this visit.   GENERAL: alert, no distress and comfortable SKIN: skin color, texture, turgor are normal, no rashes or significant lesions EYES: conjunctiva are pink and non-injected, sclera clear OROPHARYNX: no exudate, no erythema; lips, buccal mucosa, and tongue normal  NECK: supple, non-tender LYMPH:  no palpable lymphadenopathy in the cervical, axillary or inguinal LUNGS: clear to auscultation and percussion with normal breathing effort HEART:  regular rate & rhythm and no murmurs and no lower extremity edema ABDOMEN: soft, non-tender, non-distended, normal bowel sounds Musculoskeletal: no cyanosis of digits and no clubbing  PSYCH: alert & oriented x 3, fluent speech NEURO: no focal motor/sensory deficits  LABORATORY DATA:  I have reviewed the data as listed    Latest Ref Rng & Units 05/13/2022    9:17 AM 01/28/2022   10:05 AM 10/11/2021    3:41 AM  CBC  WBC 4.0 - 10.5 K/uL 8.1  10.3  7.0   Hemoglobin 13.0 - 17.0 g/dL 96.0  45.4  9.5   Hematocrit 39.0 - 52.0 % 41.6  42.6  27.2   Platelets 150 - 400 K/uL 163  187  158        Latest Ref Rng & Units 05/13/2022    9:17 AM 01/28/2022   10:05 AM 10/11/2021    3:41 AM  CMP  Glucose 70 - 99 mg/dL 96  97  94   BUN 8 - 23 mg/dL 19  23  15    Creatinine 0.61 - 1.24 mg/dL 1.61  0.96  0.45   Sodium 135 - 145 mmol/L 141  140  140   Potassium 3.5 - 5.1 mmol/L 5.0  3.7  3.9   Chloride 98 - 111 mmol/L 108  105  111   CO2 22 - 32 mmol/L 28  28  22    Calcium 8.9 - 10.3 mg/dL 40.9  9.8  9.1   Total Protein 6.5 - 8.1 g/dL 7.3  7.9  5.5   Total Bilirubin 0.3 - 1.2 mg/dL 0.5  0.7  1.7   Alkaline Phos 38 - 126 U/L 92  105  65   AST 15 - 41 U/L 29  27  36   ALT 0 - 44 U/L 29  30  29     RADIOGRAPHIC STUDIES: No results found.  ASSESSMENT & PLAN Douglas Hoffman 77 y.o. male with medical history significant for DVT/PE presents for a follow up visit.   Discussed possible provoking factors can cause venous thromboembolisms including prolonged travel/immobility, surgery (particular abdominal or orthropedic), trauma,  and pregnancy/ estrogen containing birth control. After a detailed history and review of the records there is no clear provoking factor for this patient's recent massive pulmonary embolism.  Patients with unprovoked VTEs have up to 25% recurrence after 5 years and 36% at 10 years, with 4% of these clots being fatal (BMJ [phone]). Therefore the formal recommendation for unprovoked  VTE's is lifelong anticoagulation, as the cause may not be transient or reversible. We recommend 6 months or full strength anticoagulation with a re-evaluation after that time.  The patient's will then have a choice of maintenance dose DOAC (preferred, recommended), 81mg  ASA PO daily (non-preferred), or no further anticoagulation (not recommended).    #Recurrent DVT/PE. --First episode in 2013 treated with warfarin, duration unknown --Second episode in June 2023, now on Eliqius. Felt to be unprovoked. --will order CMP and CBC at each visit to assure labs are adequate for DOAC therapy --Today show white blood cell count *** with creatinine of *** --recommend the patient continue eliquis 5mg  BID.  Offered transition to maintenance dosing but patient would like to continue on full-strength. --patient denies any bleeding, bruising, or dark stools on this medication. It is well tolerated. No difficulties accessing/affording the medication --RTC in 6 months' time with strict return precautions for overt signs of bleeding.    #Leukocytosis, resolved: -- Suspect inflammatory process secondary to hospitalization for submassive PE in June 2023. -- resolved, continue to monitor   No orders of the defined types were placed in this encounter.   All questions were answered. The patient knows to call the clinic with any problems, questions or concerns.  A total of more than 30 minutes were spent on this encounter with face-to-face time and non-face-to-face time, including preparing to see the patient, ordering tests and/or medications, counseling the patient and coordination of care as outlined above.   Ulysees Barns, MD Department of Hematology/Oncology [facility] Phone: [phone] Pager: [phone]  Email: [email or web address]  11/10/2022 8:53 AM

## 2022-11-10 NOTE — Telephone Encounter (Signed)
Due to some schedule conflicts patient is unavailable for original appointment date; patient is aware of new appointment dates/times

## 2022-11-11 ENCOUNTER — Inpatient Hospital Stay: Payer: PPO | Admitting: Hematology and Oncology

## 2022-11-11 ENCOUNTER — Inpatient Hospital Stay: Payer: PPO

## 2022-11-11 DIAGNOSIS — M7989 Other specified soft tissue disorders: Secondary | ICD-10-CM | POA: Diagnosis not present

## 2022-11-11 DIAGNOSIS — N1832 Chronic kidney disease, stage 3b: Secondary | ICD-10-CM | POA: Diagnosis not present

## 2022-11-11 DIAGNOSIS — I129 Hypertensive chronic kidney disease with stage 1 through stage 4 chronic kidney disease, or unspecified chronic kidney disease: Secondary | ICD-10-CM | POA: Diagnosis not present

## 2022-11-11 DIAGNOSIS — L089 Local infection of the skin and subcutaneous tissue, unspecified: Secondary | ICD-10-CM | POA: Diagnosis not present

## 2022-11-20 ENCOUNTER — Ambulatory Visit (HOSPITAL_COMMUNITY): Admission: RE | Admit: 2022-11-20 | Payer: PPO | Source: Ambulatory Visit

## 2022-11-20 ENCOUNTER — Ambulatory Visit (HOSPITAL_COMMUNITY): Payer: PPO

## 2022-11-24 NOTE — Progress Notes (Signed)
[facility] [phone]   [phone]  PROGRESS NOTE  Patient Care Team: Alysia Penna, MD as PCP - General (Internal Medicine)  Hematological/Oncological History #Leukocytosis: -10/01/2021: WBC 8.52, Hgb 14.4, Plt 139 (L),  -10/16/2021: WBC 16.68 (H), Hgb 10.8( L), MCV 97.4 (H), Plt 265K, ANC 12.4 (H) -11/21/2021: WBC 14.28 (H), Hgb 12.1, Plt 165K, ANC 11.1 (H) -01/28/2022: establish care with Dr. Leonides Schanz. Leukocytosis resolved.    #DVT/PE: --12/22/2010: DVT in the distal femoral and popliteal veins. Treated with IV herapin then switched to lovenox therapy as bridge for coumadin. Uncertain length of treatment but was on coumadin until June 2013.  --10/06/2021: Submassive large clot main pulmonary artery PE status post IR directed catheter thrombolysis. Transitioned to Eliquis therapy --01/28/2022: establish care with Dr. Leonides Schanz.  Interval History:  Benicio Manna 77 y.o. male with medical history significant for DVT/PE presents for a follow up visit. The patient's last visit was on 05/13/2022. In the interim since the last visit he has continued on eliquis and his leukocytosis resolved.   On exam today Mr. Kluever reports ***  He denies any fevers, chills, sweats, nausea, vomiting or diarrhea.  A full 10 point ROS was otherwise negative.  MEDICAL HISTORY:  Past Medical History:  Diagnosis Date   Acute venous embolism and thrombosis of unspecified deep vessels of lower extremity    Arthritis    knee    Cancer (HCC)    Clotting disorder (HCC)    DVT many years  ago    Glaucoma    left eye since age 47   Gout    Hip pain    History of radiation therapy    Prostate   Hypertension    Prostate cancer [facility])    s/p radiation 36 fx   Renal disorder    reports prior to joint replacement surgery , was taking 5 to 6 aleve daily for pain releif, report shas stopped this and kidney function has omporved, kidney fx followed by his PCP       SURGICAL  HISTORY: Past Surgical History:  Procedure Laterality Date   COLONOSCOPY  08/14/2010   normal    IR ANGIOGRAM PULMONARY BILATERAL SELECTIVE  10/06/2021   IR ANGIOGRAM SELECTIVE EACH ADDITIONAL VESSEL  10/06/2021   IR ANGIOGRAM SELECTIVE EACH ADDITIONAL VESSEL  10/06/2021   IR INFUSION THROMBOL ARTERIAL INITIAL (MS)  10/06/2021   IR INFUSION THROMBOL VENOUS INITIAL (MS)  10/06/2021   IR THROMB F/U EVAL ART/VEN FINAL DAY (MS)  10/07/2021   IR US GUIDE VASC ACCESS RIGHT  10/06/2021   JOINT REPLACEMENT     RIGHT Hip Replacement   JOINT REPLACEMENT     Knee Replacement   SUPRAPUBIC PROSTATECTOMY     TOTAL KNEE REVISION Left 07/07/2018   Procedure: TOTAL KNEE REVISION LEFT KNEE ARTHROPLASTY WITH FEMORAL AND TIBIAL COMPONENTS;  Surgeon: Samson Frederic, MD;  Location: [facility];  Service: Orthopedics;  Laterality: Left;    SOCIAL HISTORY: Social History   Socioeconomic History   Marital status: Married    Spouse name: Not on file   Number of children: Not on file   Years of education: Not on file   Highest education level: Not on file  Occupational History   Not on file  Tobacco Use   Smoking status: Never   Smokeless tobacco: Never  Vaping Use   Vaping status: Never Used  Substance and Sexual Activity   Alcohol use: No   Drug use: No   Sexual  activity: Not on file  Other Topics Concern   Not on file  Social History Narrative   Not on file   Social Determinants of Health   Financial Resource Strain: Not on file  Food Insecurity: Not on file  Transportation Needs: Not on file  Physical Activity: Not on file  Stress: Not on file  Social Connections: Not on file  Intimate Partner Violence: Not on file    FAMILY HISTORY: Family History  Problem Relation Age of Onset   Colon cancer Neg Hx    Colon polyps Neg Hx    Esophageal cancer Neg Hx    Rectal cancer Neg Hx    Stomach cancer Neg Hx     ALLERGIES:  is allergic to shellfish allergy.  MEDICATIONS:  Current Outpatient  Medications  Medication Sig Dispense Refill   acetaminophen (TYLENOL) 500 MG tablet Take 1,000 mg by mouth every 6 (six) hours as needed (pain).     amLODipine (NORVASC) 5 MG tablet Take 5 mg by mouth every morning.     apixaban (ELIQUIS) 5 MG TABS tablet Take 1 tablet (5 mg total) by mouth 2 (two) times daily. 60 tablet 11   atorvastatin (LIPITOR) 80 MG tablet Take 1 tablet (80 mg total) by mouth daily. 30 tablet 11   triamcinolone cream (KENALOG) 0.1 % Apply 1 application. topically 2 (two) times daily as needed for rash.     No current facility-administered medications for this visit.    REVIEW OF SYSTEMS:   Constitutional: ( - ) fevers, ( - )  chills , ( - ) night sweats Eyes: ( - ) blurriness of vision, ( - ) double vision, ( - ) watery eyes Ears, nose, mouth, throat, and face: ( - ) mucositis, ( - ) sore throat Respiratory: ( - ) cough, ( - ) dyspnea, ( - ) wheezes Cardiovascular: ( - ) palpitation, ( - ) chest discomfort, ( - ) lower extremity swelling Gastrointestinal:  ( - ) nausea, ( - ) heartburn, ( - ) change in bowel habits Skin: ( - ) abnormal skin rashes Lymphatics: ( - ) new lymphadenopathy, ( - ) easy bruising Neurological: ( - ) numbness, ( - ) tingling, ( - ) new weaknesses Behavioral/Psych: ( - ) mood change, ( - ) new changes  All other systems were reviewed with the patient and are negative.  PHYSICAL EXAMINATION:  There were no vitals filed for this visit.  There were no vitals filed for this visit.   GENERAL: well appearing elderly *** alert, no distress and comfortable SKIN: skin color, texture, turgor are normal, no rashes or significant lesions EYES: conjunctiva are pink and non-injected, sclera clear LUNGS: clear to auscultation and percussion with normal breathing effort HEART: regular rate & rhythm and no murmurs and no lower extremity edema Musculoskeletal: no cyanosis of digits and no clubbing  PSYCH: alert & oriented x 3, fluent speech NEURO: no  focal motor/sensory deficits  LABORATORY DATA:  I have reviewed the data as listed    Latest Ref Rng & Units 05/13/2022    9:17 AM 01/28/2022   10:05 AM 10/11/2021    3:41 AM  CBC  WBC 4.0 - 10.5 K/uL 8.1  10.3  7.0   Hemoglobin 13.0 - 17.0 g/dL 16.1  09.6  9.5   Hematocrit 39.0 - 52.0 % 41.6  42.6  27.2   Platelets 150 - 400 K/uL 163  187  158        Latest  Ref Rng & Units 05/13/2022    9:17 AM 01/28/2022   10:05 AM 10/11/2021    3:41 AM  CMP  Glucose 70 - 99 mg/dL 96  97  94   BUN 8 - 23 mg/dL 19  23  15    Creatinine 0.61 - 1.24 mg/dL 1.61  0.96  0.45   Sodium 135 - 145 mmol/L 141  140  140   Potassium 3.5 - 5.1 mmol/L 5.0  3.7  3.9   Chloride 98 - 111 mmol/L 108  105  111   CO2 22 - 32 mmol/L 28  28  22    Calcium 8.9 - 10.3 mg/dL 40.9  9.8  9.1   Total Protein 6.5 - 8.1 g/dL 7.3  7.9  5.5   Total Bilirubin 0.3 - 1.2 mg/dL 0.5  0.7  1.7   Alkaline Phos 38 - 126 U/L 92  105  65   AST 15 - 41 U/L 29  27  36   ALT 0 - 44 U/L 29  30  29     RADIOGRAPHIC STUDIES: No results found.  ASSESSMENT & PLAN Aswad Wandrey 77 y.o. male with medical history significant for DVT/PE presents for a follow up visit.   Discussed possible provoking factors can cause venous thromboembolisms including prolonged travel/immobility, surgery (particular abdominal or orthropedic), trauma,  and pregnancy/ estrogen containing birth control. After a detailed history and review of the records there is no clear provoking factor for this patient's recent massive pulmonary embolism.  Patients with unprovoked VTEs have up to 25% recurrence after 5 years and 36% at 10 years, with 4% of these clots being fatal ([phone]). Therefore the formal recommendation for unprovoked VTE's is lifelong anticoagulation, as the cause may not be transient or reversible. We recommend 6 months or full strength anticoagulation with a re-evaluation after that time.  The patient's will then have a choice of maintenance dose DOAC  (preferred, recommended), 81mg  ASA PO daily (non-preferred), or no further anticoagulation (not recommended).    #Recurrent DVT/PE. --First episode in 2013 treated with warfarin, duration unknown --Second episode in June 2023, now on Eliqius. Felt to be unprovoked. --will order CMP and CBC at each visit to assure labs are adequate for DOAC therapy --Today show white blood cell count *** --recommend the patient continue eliquis 5mg  BID.  Offered transition to maintenance dosing but patient would like to continue on full-strength. --patient denies any bleeding, bruising, or dark stools on this medication. It is well tolerated. No difficulties accessing/affording the medication --RTC in 6 months' time with strict return precautions for overt signs of bleeding.    #Leukocytosis, resolved: -- Suspect inflammatory process secondary to hospitalization for submassive PE in June 2023. -- resolved, continue to monitor   No orders of the defined types were placed in this encounter.   All questions were answered. The patient knows to call the clinic with any problems, questions or concerns.  A total of more than 30 minutes were spent on this encounter with face-to-face time and non-face-to-face time, including preparing to see the patient, ordering tests and/or medications, counseling the patient and coordination of care as outlined above.   Ulysees Barns, MD Department of Hematology/Oncology [facility] at [facility] Phone: [phone] Pager: [phone] Email: [email or web address]  11/24/2022 10:54 AM

## 2022-11-25 ENCOUNTER — Inpatient Hospital Stay: Payer: PPO | Admitting: Hematology and Oncology

## 2022-11-25 ENCOUNTER — Inpatient Hospital Stay: Payer: PPO | Attending: Hematology and Oncology

## 2022-11-25 ENCOUNTER — Other Ambulatory Visit: Payer: Self-pay

## 2022-11-25 VITALS — BP 151/88 | HR 69 | Temp 97.3°F | Resp 13 | Wt 199.3 lb

## 2022-11-25 DIAGNOSIS — Z79899 Other long term (current) drug therapy: Secondary | ICD-10-CM | POA: Insufficient documentation

## 2022-11-25 DIAGNOSIS — D72829 Elevated white blood cell count, unspecified: Secondary | ICD-10-CM | POA: Insufficient documentation

## 2022-11-25 DIAGNOSIS — Z86711 Personal history of pulmonary embolism: Secondary | ICD-10-CM

## 2022-11-25 DIAGNOSIS — D72828 Other elevated white blood cell count: Secondary | ICD-10-CM | POA: Diagnosis not present

## 2022-11-25 DIAGNOSIS — Z7901 Long term (current) use of anticoagulants: Secondary | ICD-10-CM | POA: Insufficient documentation

## 2022-11-25 DIAGNOSIS — Z86718 Personal history of other venous thrombosis and embolism: Secondary | ICD-10-CM | POA: Diagnosis not present

## 2022-11-25 LAB — CBC WITH DIFFERENTIAL (CANCER CENTER ONLY)
Abs Immature Granulocytes: 0.05 10*3/uL (ref 0.00–0.07)
Basophils Absolute: 0.1 10*3/uL (ref 0.0–0.1)
Basophils Relative: 1 %
Eosinophils Absolute: 0.7 10*3/uL — ABNORMAL HIGH (ref 0.0–0.5)
Eosinophils Relative: 10 %
HCT: 37.6 % — ABNORMAL LOW (ref 39.0–52.0)
Hemoglobin: 12.6 g/dL — ABNORMAL LOW (ref 13.0–17.0)
Immature Granulocytes: 1 %
Lymphocytes Relative: 18 %
Lymphs Abs: 1.3 10*3/uL (ref 0.7–4.0)
MCH: 31.3 pg (ref 26.0–34.0)
MCHC: 33.5 g/dL (ref 30.0–36.0)
MCV: 93.5 fL (ref 80.0–100.0)
Monocytes Absolute: 0.8 10*3/uL (ref 0.1–1.0)
Monocytes Relative: 11 %
Neutro Abs: 4.3 10*3/uL (ref 1.7–7.7)
Neutrophils Relative %: 59 %
Platelet Count: 163 10*3/uL (ref 150–400)
RBC: 4.02 MIL/uL — ABNORMAL LOW (ref 4.22–5.81)
RDW: 13.6 % (ref 11.5–15.5)
WBC Count: 7.2 10*3/uL (ref 4.0–10.5)
nRBC: 0 % (ref 0.0–0.2)

## 2022-11-25 LAB — CMP (CANCER CENTER ONLY)
ALT: 34 U/L (ref 0–44)
AST: 41 U/L (ref 15–41)
Albumin: 3.7 g/dL (ref 3.5–5.0)
Alkaline Phosphatase: 93 U/L (ref 38–126)
Anion gap: 6 (ref 5–15)
BUN: 22 mg/dL (ref 8–23)
CO2: 24 mmol/L (ref 22–32)
Calcium: 9.8 mg/dL (ref 8.9–10.3)
Chloride: 111 mmol/L (ref 98–111)
Creatinine: 1.42 mg/dL — ABNORMAL HIGH (ref 0.61–1.24)
GFR, Estimated: 51 mL/min — ABNORMAL LOW (ref >60)
Glucose, Bld: 103 mg/dL — ABNORMAL HIGH (ref 70–99)
Potassium: 3.8 mmol/L (ref 3.5–5.1)
Sodium: 141 mmol/L (ref 135–145)
Total Bilirubin: 0.4 mg/dL (ref 0.3–1.2)
Total Protein: 6.5 g/dL (ref 6.5–8.1)

## 2022-11-26 ENCOUNTER — Ambulatory Visit (HOSPITAL_COMMUNITY): Payer: PPO

## 2022-11-26 ENCOUNTER — Ambulatory Visit (HOSPITAL_COMMUNITY)
Admission: RE | Admit: 2022-11-26 | Discharge: 2022-11-26 | Disposition: A | Discharge status: Home / Self Care | Payer: PPO | Source: Ambulatory Visit | Attending: Vascular Surgery | Admitting: Vascular Surgery

## 2022-11-26 ENCOUNTER — Other Ambulatory Visit (HOSPITAL_COMMUNITY): Payer: Self-pay | Admitting: Internal Medicine

## 2022-11-26 ENCOUNTER — Encounter (HOSPITAL_COMMUNITY): Payer: Self-pay

## 2022-11-26 DIAGNOSIS — R2242 Localized swelling, mass and lump, left lower limb: Secondary | ICD-10-CM | POA: Diagnosis not present

## 2022-12-01 DIAGNOSIS — B356 Tinea cruris: Secondary | ICD-10-CM | POA: Diagnosis not present

## 2022-12-01 DIAGNOSIS — B354 Tinea corporis: Secondary | ICD-10-CM | POA: Diagnosis not present

## 2022-12-01 DIAGNOSIS — B350 Tinea barbae and tinea capitis: Secondary | ICD-10-CM | POA: Diagnosis not present

## 2022-12-01 DIAGNOSIS — B353 Tinea pedis: Secondary | ICD-10-CM | POA: Diagnosis not present

## 2022-12-22 DIAGNOSIS — B350 Tinea barbae and tinea capitis: Secondary | ICD-10-CM | POA: Diagnosis not present

## 2022-12-22 DIAGNOSIS — B353 Tinea pedis: Secondary | ICD-10-CM | POA: Diagnosis not present

## 2022-12-22 DIAGNOSIS — B354 Tinea corporis: Secondary | ICD-10-CM | POA: Diagnosis not present

## 2022-12-22 DIAGNOSIS — B356 Tinea cruris: Secondary | ICD-10-CM | POA: Diagnosis not present

## 2023-01-20 DIAGNOSIS — Z23 Encounter for immunization: Secondary | ICD-10-CM | POA: Diagnosis not present

## 2023-02-22 ENCOUNTER — Ambulatory Visit: Payer: PPO | Admitting: Pulmonary Disease

## 2023-02-26 DIAGNOSIS — T25321A Burn of third degree of right foot, initial encounter: Secondary | ICD-10-CM | POA: Diagnosis not present

## 2023-02-26 DIAGNOSIS — M79671 Pain in right foot: Secondary | ICD-10-CM | POA: Diagnosis not present

## 2023-02-26 DIAGNOSIS — I129 Hypertensive chronic kidney disease with stage 1 through stage 4 chronic kidney disease, or unspecified chronic kidney disease: Secondary | ICD-10-CM | POA: Diagnosis not present

## 2023-03-12 DIAGNOSIS — M79671 Pain in right foot: Secondary | ICD-10-CM | POA: Diagnosis not present

## 2023-03-12 DIAGNOSIS — I129 Hypertensive chronic kidney disease with stage 1 through stage 4 chronic kidney disease, or unspecified chronic kidney disease: Secondary | ICD-10-CM | POA: Diagnosis not present

## 2023-03-12 DIAGNOSIS — T25321D Burn of third degree of right foot, subsequent encounter: Secondary | ICD-10-CM | POA: Diagnosis not present

## 2023-03-16 DIAGNOSIS — T25321A Burn of third degree of right foot, initial encounter: Secondary | ICD-10-CM | POA: Diagnosis not present

## 2023-04-19 ENCOUNTER — Encounter: Payer: Self-pay | Admitting: Family Medicine

## 2023-04-19 ENCOUNTER — Ambulatory Visit (INDEPENDENT_AMBULATORY_CARE_PROVIDER_SITE_OTHER): Payer: PPO | Admitting: Family Medicine

## 2023-04-19 VITALS — BP 120/82 | HR 85 | Temp 98.4°F | Ht 72.0 in | Wt 199.4 lb

## 2023-04-19 DIAGNOSIS — N1831 Chronic kidney disease, stage 3a: Secondary | ICD-10-CM | POA: Diagnosis not present

## 2023-04-19 DIAGNOSIS — I2699 Other pulmonary embolism without acute cor pulmonale: Secondary | ICD-10-CM | POA: Diagnosis not present

## 2023-04-19 DIAGNOSIS — Z1159 Encounter for screening for other viral diseases: Secondary | ICD-10-CM

## 2023-04-19 DIAGNOSIS — E785 Hyperlipidemia, unspecified: Secondary | ICD-10-CM

## 2023-04-19 DIAGNOSIS — R7303 Prediabetes: Secondary | ICD-10-CM

## 2023-04-19 DIAGNOSIS — R0989 Other specified symptoms and signs involving the circulatory and respiratory systems: Secondary | ICD-10-CM | POA: Diagnosis not present

## 2023-04-19 DIAGNOSIS — I10 Essential (primary) hypertension: Secondary | ICD-10-CM

## 2023-04-19 DIAGNOSIS — I2510 Atherosclerotic heart disease of native coronary artery without angina pectoris: Secondary | ICD-10-CM | POA: Diagnosis not present

## 2023-04-19 DIAGNOSIS — T25221D Burn of second degree of right foot, subsequent encounter: Secondary | ICD-10-CM | POA: Diagnosis not present

## 2023-04-19 DIAGNOSIS — C61 Malignant neoplasm of prostate: Secondary | ICD-10-CM | POA: Diagnosis not present

## 2023-04-19 LAB — BASIC METABOLIC PANEL
BUN: 22 mg/dL (ref 6–23)
CO2: 25 meq/L (ref 19–32)
Calcium: 9.8 mg/dL (ref 8.4–10.5)
Chloride: 109 meq/L (ref 96–112)
Creatinine, Ser: 1.62 mg/dL — ABNORMAL HIGH (ref 0.40–1.50)
GFR: 40.62 mL/min — ABNORMAL LOW (ref >60.00)
Glucose, Bld: 114 mg/dL — ABNORMAL HIGH (ref 70–99)
Potassium: 4.4 meq/L (ref 3.5–5.1)
Sodium: 142 meq/L (ref 135–145)

## 2023-04-19 LAB — LIPID PANEL
Cholesterol: 178 mg/dL (ref 0–200)
HDL: 39.6 mg/dL (ref >39.00)
LDL Cholesterol: 121 mg/dL — ABNORMAL HIGH (ref 0–99)
NonHDL: 138.16
Total CHOL/HDL Ratio: 4
Triglycerides: 88 mg/dL (ref 0.0–149.0)
VLDL: 17.6 mg/dL (ref 0.0–40.0)

## 2023-04-19 LAB — MICROALBUMIN / CREATININE URINE RATIO
Creatinine,U: 103 mg/dL
Microalb Creat Ratio: 10.2 mg/g (ref 0.0–30.0)
Microalb, Ur: 10.5 mg/dL — ABNORMAL HIGH (ref 0.0–1.9)

## 2023-04-19 LAB — HEMOGLOBIN A1C: Hgb A1c MFr Bld: 6.3 % (ref 4.6–6.5)

## 2023-04-19 LAB — VITAMIN D 25 HYDROXY (VIT D DEFICIENCY, FRACTURES): VITD: 33.39 ng/mL (ref 30.00–100.00)

## 2023-04-19 MED ORDER — SILVER SULFADIAZINE 1 % EX CREA: 1.0000 | TOPICAL_CREAM | Freq: Every day | CUTANEOUS | 0 refills | Status: DC

## 2023-04-19 NOTE — Assessment & Plan Note (Signed)
Stable. Risk factors: HTN and chronic NSAID's use. Cr 1.4 and e GFR 45 in 11/2022. Continue avoiding NSAIDs. Also recommend adequate hydration and low-salt diet.

## 2023-04-19 NOTE — Assessment & Plan Note (Signed)
BP adequately controlled. Continue amlodipine 5 mg daily. Low-salt diet also recommended. Continue monitoring BP regularly. He is trying to establish with a new eye care provider.

## 2023-04-19 NOTE — Assessment & Plan Note (Signed)
Encourage consistency with a healthy lifestyle for diabetes prevention. Last hemoglobin A1c 6.1 in 10/2021. Appointment with nutrition will be arranged, which he requested.

## 2023-04-19 NOTE — Progress Notes (Incomplete)
HPI: Douglas Hoffman is a 77 y.o. male with a PMHx significant for HTN, DVT, NSTEMI, CKD, prostate cancer, and gout among some, who is here today to establish care.  Former PCP: Alysia Penna, MD Last preventive routine visit: [id number] Follows with hematologist (Dr. Leonides Schanz), urology (Dr. Annabell Howells), and pulmonology (Dr. Judeth Horn)  Exercise: Patient states he walks frequently at work.  Alcohol Use: none Smoking: never Vision: He used to see Dr. Nile Riggs, but has not seen anyone else for over a year.  Dental: He does not see a dentist regularly.   Chronic medical problems:   Prostate cancer:  He sees his urologist once per year. His next appointment is on 1/16. He is not currently on any treatment, but is being monitored.   Hypertension:  Medications: Currently on amlodipine 5 mg daily.  BP readings at home: He checks his BP regularly at home and says his readings are normal.  Side effects: none He doesn't follow with a cardiologist.  Negative for unusual or severe headache, visual changes, exertional chest pain, dyspnea, palpitations, focal weakness, or edema.  Lab Results  Component Value Date   CREATININE 1.42 (H) 11/25/2022   BUN 22 11/25/2022   NA 141 11/25/2022   K 3.8 11/25/2022   CL 111 11/25/2022   CO2 24 11/25/2022   CAD/:  He is currently on Eliquis 5 mg bid.  He has been on coumadin in the past.  He has had two blood clot events in the past. The first was a right DVT shortly after his prostate surgery. The second was a PE without any significant events around that time.  His last heart attack was in 2020***.   Hyperlipidemia: Currently on atorvastatin 80 mg daily.  Side effects from medication: none Lab Results  Component Value Date   CHOL 163 10/06/2021   HDL 34 (L) 10/06/2021   LDLCALC 125 (H) 10/06/2021   TRIG 21 10/06/2021   CHOLHDL 4.8 10/06/2021   CKD III:  He has not seen a nephrologist before.  He mentions he took Aleve for many years.    Prediabetes: Lab Results  Component Value Date   HGBA1C 6.1 (H) 10/06/2021   Concerns today:   Polyuria:  He complains he has been urinating frequently for awhile. He believes it is related to his prostate. Denies burning sensation or pain.  He is trying to drink less fluids before bed, but even when doing so, he has to get up about 3x per night.   Right foot:  He also mentions he spilled some coffee and burned his right foot about a month ago. He says it is gradually healing.   Review of Systems See other pertinent positives and negatives in HPI.  Current Outpatient Medications on File Prior to Visit  Medication Sig Dispense Refill   acetaminophen (TYLENOL) 500 MG tablet Take 1,000 mg by mouth every 6 (six) hours as needed (pain).     amLODipine (NORVASC) 5 MG tablet Take 5 mg by mouth every morning.     apixaban (ELIQUIS) 5 MG TABS tablet Take 1 tablet (5 mg total) by mouth 2 (two) times daily. 60 tablet 11   atorvastatin (LIPITOR) 80 MG tablet Take 1 tablet (80 mg total) by mouth daily. 30 tablet 11   triamcinolone cream (KENALOG) 0.1 % Apply 1 application. topically 2 (two) times daily as needed for rash.     No current facility-administered medications on file prior to visit.    Past Medical History:  Diagnosis Date  Acute venous embolism and thrombosis of unspecified deep vessels of lower extremity    Arthritis    knee    Cancer (HCC)    Clotting disorder (HCC)    DVT many years  ago    Glaucoma    left eye since age 21   Gout    Hip pain    History of radiation therapy    Prostate   Hypertension    Prostate cancer [facility])    s/p radiation 36 fx   Renal disorder    reports prior to joint replacement surgery , was taking 5 to 6 aleve daily for pain releif, report shas stopped this and kidney function has omporved, kidney fx followed by his PCP      Allergies  Allergen Reactions   Shellfish Allergy Swelling    Gout     Family History  Problem Relation Age  of Onset   Colon cancer Neg Hx    Colon polyps Neg Hx    Esophageal cancer Neg Hx    Rectal cancer Neg Hx    Stomach cancer Neg Hx     Social History   Socioeconomic History   Marital status: Married    Spouse name: Not on file   Number of children: Not on file   Years of education: Not on file   Highest education level: Not on file  Occupational History   Not on file  Tobacco Use   Smoking status: Never   Smokeless tobacco: Never  Vaping Use   Vaping status: Never Used  Substance and Sexual Activity   Alcohol use: No   Drug use: No   Sexual activity: Not on file  Other Topics Concern   Not on file  Social History Narrative   Not on file   Social Drivers of Health   Financial Resource Strain: Not on file  Food Insecurity: Not on file  Transportation Needs: Not on file  Physical Activity: Not on file  Stress: Not on file  Social Connections: Not on file    There were no vitals filed for this visit.  There is no height or weight on file to calculate BMI.  Physical Exam Nursing note reviewed.  Constitutional:      General: He is not in acute distress.    Appearance: He is well-developed.  HENT:     Head: Normocephalic and atraumatic.     Mouth/Throat:     Mouth: Mucous membranes are moist.     Pharynx: Oropharynx is clear. Uvula midline.  Eyes:     Conjunctiva/sclera: Conjunctivae normal.     Comments: ***  Cardiovascular:     Rate and Rhythm: Normal rate. Rhythm regularly irregular.     Pulses:          Dorsalis pedis pulses are 2+ on the right side.       Posterior tibial pulses are 2+ on the right side and 2+ on the left side.     Heart sounds: No murmur heard.    Comments: Decreased DP pulse on right foot. *** Pulmonary:     Effort: Pulmonary effort is normal. No respiratory distress.     Breath sounds: Normal breath sounds.  Abdominal:     Palpations: Abdomen is soft. There is no hepatomegaly or mass.     Tenderness: There is no abdominal  tenderness.  Musculoskeletal:     Right lower leg: No edema.     Left lower leg: No edema.  Feet:  Comments: *** Lymphadenopathy:     Cervical: No cervical adenopathy.  Skin:    General: Skin is warm.     Findings: No erythema or rash.          Comments: Postinflammatory pigment changes in the right foot.   Neurological:     Mental Status: He is alert and oriented to person, place, and time.     Cranial Nerves: No cranial nerve deficit.     Gait: Gait normal.  Psychiatric:     Comments: Well groomed, good eye contact.    ASSESSMENT AND PLAN:  Mr. Friar was seen today to establish care.   There are no diagnoses linked to this encounter.  No follow-ups on file.  I, Suanne Marker, acting as a scribe for Rushil Kimbrell [place], MD., have documented all relevant documentation on the behalf of Porfiria Heinrich [place], MD, as directed by  Chiante Peden [place], MD while in the presence of Desmond Szabo [place], MD.   I, Suanne Marker, have reviewed all documentation for this visit. The documentation on 04/19/23 for the exam, diagnosis, procedures, and orders are all accurate and complete.  Quirino Kakos G. [place], MD  [facility]. [place] office.

## 2023-04-19 NOTE — Patient Instructions (Addendum)
A few things to remember from today's visit:  Decreased pedal pulses - Plan: VAS [place] ABI WITH/WO TBI  Stage 3a chronic kidney disease (HCC) - Plan: Basic metabolic panel, Microalbumin / creatinine urine ratio, VITAMIN D 25 Hydroxy (Vit-D Deficiency, Fractures)  Coronary artery disease involving native coronary artery of native heart without angina pectoris - Plan: EKG 12-Lead, Lipid panel  PE (pulmonary thromboembolism) (HCC)  Prediabetes - Plan: Hemoglobin A1c  Hyperlipidemia, unspecified hyperlipidemia type - Plan: Lipid panel  Encounter for HCV screening test for low risk patient - Plan: Hepatitis C antibody  Partial thickness burn of right foot, subsequent encounter - Plan: silver sulfADIAZINE (SILVADENE) 1 % cream  No changes today. Right leg circulation test will be arranged. Keep wound clean with soap and water.  If you need refills for medications you take chronically, please call your pharmacy. Do not use My Chart to request refills or for acute issues that need immediate attention. If you send a my chart message, it may take a few days to be addressed, specially if I am not in the office.  Please be sure medication list is accurate. If a new problem present, please set up appointment sooner than planned today.

## 2023-04-19 NOTE — Assessment & Plan Note (Signed)
He is not following with cardiologist. LDL is not at goal, he was 125 in 10/2021. Currently on amlodipine 5 mg daily and atorvastatin 80 mg daily. He is on chronic anticoagulation, Eliquis 5 mg twice daily. EKG today sinus rhythm with PACs. Normal axis and intervals, no T or ST changes.

## 2023-04-19 NOTE — Assessment & Plan Note (Signed)
Continue atorvastatin 80 mg daily and low fat diet. Further recommendation will be given according to lipid panel result.

## 2023-04-19 NOTE — Assessment & Plan Note (Signed)
Recurrent thrombotic events, DVT and PE. Currently on Eliquis 5 mg twice daily. He follows with hematologist.

## 2023-04-20 ENCOUNTER — Ambulatory Visit (HOSPITAL_COMMUNITY)
Admission: RE | Admit: 2023-04-20 | Discharge: 2023-04-20 | Disposition: A | Discharge status: Home / Self Care | Payer: PPO | Source: Ambulatory Visit | Attending: Family Medicine | Admitting: Family Medicine

## 2023-04-20 DIAGNOSIS — R0989 Other specified symptoms and signs involving the circulatory and respiratory systems: Secondary | ICD-10-CM | POA: Insufficient documentation

## 2023-04-20 LAB — VAS US ABI WITH/WO TBI
Left ABI: 1.09
Right ABI: 1.11

## 2023-04-20 LAB — HEPATITIS C ANTIBODY: Hepatitis C Ab: NONREACTIVE

## 2023-04-21 ENCOUNTER — Other Ambulatory Visit: Payer: Self-pay

## 2023-04-21 DIAGNOSIS — R0989 Other specified symptoms and signs involving the circulatory and respiratory systems: Secondary | ICD-10-CM

## 2023-04-23 ENCOUNTER — Other Ambulatory Visit: Payer: Self-pay

## 2023-04-23 DIAGNOSIS — T25221D Burn of second degree of right foot, subsequent encounter: Secondary | ICD-10-CM

## 2023-04-23 MED ORDER — EZETIMIBE 10 MG PO TABS: 10.0000 mg | ORAL_TABLET | Freq: Every day | ORAL | 3 refills | Status: AC

## 2023-04-23 MED ORDER — SILVER SULFADIAZINE 1 % EX CREA: 1.0000 | TOPICAL_CREAM | Freq: Every day | CUTANEOUS | 0 refills | Status: AC

## 2023-05-17 DIAGNOSIS — Z8546 Personal history of malignant neoplasm of prostate: Secondary | ICD-10-CM | POA: Diagnosis not present

## 2023-05-17 DIAGNOSIS — N3941 Urge incontinence: Secondary | ICD-10-CM | POA: Diagnosis not present

## 2023-05-26 ENCOUNTER — Other Ambulatory Visit: Payer: Self-pay | Admitting: Hematology and Oncology

## 2023-05-26 ENCOUNTER — Inpatient Hospital Stay: Payer: PPO | Admitting: Hematology and Oncology

## 2023-05-26 ENCOUNTER — Inpatient Hospital Stay: Payer: PPO | Attending: Internal Medicine

## 2023-05-26 DIAGNOSIS — Z86711 Personal history of pulmonary embolism: Secondary | ICD-10-CM | POA: Diagnosis not present

## 2023-05-26 DIAGNOSIS — E785 Hyperlipidemia, unspecified: Secondary | ICD-10-CM | POA: Diagnosis not present

## 2023-05-26 DIAGNOSIS — E663 Overweight: Secondary | ICD-10-CM | POA: Diagnosis not present

## 2023-05-26 DIAGNOSIS — D689 Coagulation defect, unspecified: Secondary | ICD-10-CM | POA: Diagnosis not present

## 2023-05-26 DIAGNOSIS — I129 Hypertensive chronic kidney disease with stage 1 through stage 4 chronic kidney disease, or unspecified chronic kidney disease: Secondary | ICD-10-CM | POA: Diagnosis not present

## 2023-05-26 DIAGNOSIS — D72829 Elevated white blood cell count, unspecified: Secondary | ICD-10-CM | POA: Insufficient documentation

## 2023-05-26 DIAGNOSIS — I10 Essential (primary) hypertension: Secondary | ICD-10-CM | POA: Diagnosis not present

## 2023-05-26 DIAGNOSIS — M1990 Unspecified osteoarthritis, unspecified site: Secondary | ICD-10-CM | POA: Diagnosis not present

## 2023-05-26 DIAGNOSIS — N184 Chronic kidney disease, stage 4 (severe): Secondary | ICD-10-CM | POA: Diagnosis not present

## 2023-05-26 DIAGNOSIS — Z86718 Personal history of other venous thrombosis and embolism: Secondary | ICD-10-CM | POA: Insufficient documentation

## 2023-05-26 DIAGNOSIS — H548 Legal blindness, as defined in USA: Secondary | ICD-10-CM | POA: Diagnosis not present

## 2023-05-26 DIAGNOSIS — G8929 Other chronic pain: Secondary | ICD-10-CM | POA: Diagnosis not present

## 2023-05-26 DIAGNOSIS — D6869 Other thrombophilia: Secondary | ICD-10-CM | POA: Diagnosis not present

## 2023-05-26 DIAGNOSIS — Z7901 Long term (current) use of anticoagulants: Secondary | ICD-10-CM | POA: Insufficient documentation

## 2023-05-26 DIAGNOSIS — M109 Gout, unspecified: Secondary | ICD-10-CM | POA: Diagnosis not present

## 2023-05-26 NOTE — Progress Notes (Signed)
[facility] Telephone:(336) [phone]   Fax:([phone]  PROGRESS NOTE  Patient Care Team: [place], [name], MD as PCP - General (Family Medicine)  Hematological/Oncological History #DVT/PE: --12/22/2010: DVT in the distal femoral and popliteal veins. Treated with IV herapin then switched to lovenox therapy as bridge for coumadin. Uncertain length of treatment but was on coumadin until June 2013.  --10/06/2021: Submassive large clot main pulmonary artery PE status post IR directed catheter thrombolysis. Transitioned to Eliquis therapy --01/28/2022: establish care with Dr. Leonides Schanz.  #Leukocytosis: -10/01/2021: WBC 8.52, Hgb 14.4, Plt 139 (L),  -10/16/2021: WBC 16.68 (H), Hgb 10.8( L), MCV 97.4 (H), Plt 265K, ANC 12.4 (H) -11/21/2021: WBC 14.28 (H), Hgb 12.1, Plt 165K, ANC 11.1 (H) -01/28/2022: establish care with Dr. Leonides Schanz. Leukocytosis resolved.   Interval History:  Douglas Hoffman 78 y.o. male with medical history significant for DVT/PE presents for a follow up visit. The patient's last visit was on 11/25/2022. In the interim since the last visit he has continued on eliquis and his leukocytosis resolved.   On exam today Douglas Hoffman reports *** He denies any fevers, chills, sweats, nausea, vomiting or diarrhea.  A full 10 point ROS was otherwise negative.  MEDICAL HISTORY:  Past Medical History:  Diagnosis Date   Acute venous embolism and thrombosis of unspecified deep vessels of lower extremity    Arthritis    knee    Cancer (HCC)    Clotting disorder (HCC)    DVT many years  ago    Glaucoma    left eye since age 50   Gout    Hip pain    History of radiation therapy    Prostate   Hypertension    Prostate cancer [facility])    s/p radiation 36 fx   Renal disorder    reports prior to joint replacement surgery , was taking 5 to 6 aleve daily for pain releif, report shas stopped this and kidney function has omporved, kidney fx followed by his PCP       SURGICAL  HISTORY: Past Surgical History:  Procedure Laterality Date   COLONOSCOPY  08/14/2010   normal    IR ANGIOGRAM PULMONARY BILATERAL SELECTIVE  10/06/2021   IR ANGIOGRAM SELECTIVE EACH ADDITIONAL VESSEL  10/06/2021   IR ANGIOGRAM SELECTIVE EACH ADDITIONAL VESSEL  10/06/2021   IR INFUSION THROMBOL ARTERIAL INITIAL (MS)  10/06/2021   IR INFUSION THROMBOL VENOUS INITIAL (MS)  10/06/2021   IR THROMB F/U EVAL ART/VEN FINAL DAY (MS)  10/07/2021   IR US GUIDE VASC ACCESS RIGHT  10/06/2021   JOINT REPLACEMENT     RIGHT Hip Replacement   JOINT REPLACEMENT     Knee Replacement   SUPRAPUBIC PROSTATECTOMY     TOTAL KNEE REVISION Left 07/07/2018   Procedure: TOTAL KNEE REVISION LEFT KNEE ARTHROPLASTY WITH FEMORAL AND TIBIAL COMPONENTS;  Surgeon: Samson Frederic, MD;  Location: [facility];  Service: Orthopedics;  Laterality: Left;    SOCIAL HISTORY: Social History   Socioeconomic History   Marital status: Married    Spouse name: Not on file   Number of children: Not on file   Years of education: Not on file   Highest education level: Not on file  Occupational History   Not on file  Tobacco Use   Smoking status: Never   Smokeless tobacco: Never  Vaping Use   Vaping status: Never Used  Substance and Sexual Activity   Alcohol use: No   Drug use: No   Sexual activity:  Not on file  Other Topics Concern   Not on file  Social History Narrative   Not on file   Social Drivers of Health   Financial Resource Strain: Not on file  Food Insecurity: Not on file  Transportation Needs: Not on file  Physical Activity: Not on file  Stress: Not on file  Social Connections: Not on file  Intimate Partner Violence: Not on file    FAMILY HISTORY: Family History  Problem Relation Age of Onset   Colon cancer Neg Hx    Colon polyps Neg Hx    Esophageal cancer Neg Hx    Rectal cancer Neg Hx    Stomach cancer Neg Hx     ALLERGIES:  is allergic to shellfish allergy.  MEDICATIONS:  Current Outpatient Medications   Medication Sig Dispense Refill   acetaminophen (TYLENOL) 500 MG tablet Take 1,000 mg by mouth every 6 (six) hours as needed (pain).     amLODipine (NORVASC) 5 MG tablet Take 5 mg by mouth every morning.     apixaban (ELIQUIS) 5 MG TABS tablet Take 1 tablet (5 mg total) by mouth 2 (two) times daily. 60 tablet 11   atorvastatin (LIPITOR) 80 MG tablet Take 1 tablet (80 mg total) by mouth daily. 30 tablet 11   ezetimibe (ZETIA) 10 MG tablet Take 1 tablet (10 mg total) by mouth daily. 90 tablet 3   triamcinolone cream (KENALOG) 0.1 % Apply 1 application. topically 2 (two) times daily as needed for rash.     No current facility-administered medications for this visit.    REVIEW OF SYSTEMS:   Constitutional: ( - ) fevers, ( - )  chills , ( - ) night sweats Eyes: ( - ) blurriness of vision, ( - ) double vision, ( - ) watery eyes Ears, nose, mouth, throat, and face: ( - ) mucositis, ( - ) sore throat Respiratory: ( - ) cough, ( - ) dyspnea, ( - ) wheezes Cardiovascular: ( - ) palpitation, ( - ) chest discomfort, ( - ) lower extremity swelling Gastrointestinal:  ( - ) nausea, ( - ) heartburn, ( - ) change in bowel habits Skin: ( - ) abnormal skin rashes Lymphatics: ( - ) new lymphadenopathy, ( - ) easy bruising Neurological: ( - ) numbness, ( - ) tingling, ( - ) new weaknesses Behavioral/Psych: ( - ) mood change, ( - ) new changes  All other systems were reviewed with the patient and are negative.  PHYSICAL EXAMINATION:  There were no vitals filed for this visit.   There were no vitals filed for this visit.    GENERAL: well appearing elderly African-American male alert, no distress and comfortable SKIN: skin color, texture, turgor are normal, no rashes or significant lesions EYES: conjunctiva are pink and non-injected, sclera clear LUNGS: clear to auscultation and percussion with normal breathing effort HEART: regular rate & rhythm and no murmurs and no lower extremity  edema Musculoskeletal: no cyanosis of digits and no clubbing  PSYCH: alert & oriented x 3, fluent speech NEURO: no focal motor/sensory deficits  LABORATORY DATA:  I have reviewed the data as listed    Latest Ref Rng & Units 11/25/2022    7:34 AM 05/13/2022    9:17 AM 01/28/2022   10:05 AM  CBC  WBC 4.0 - 10.5 K/uL 7.2  8.1  10.3   Hemoglobin 13.0 - 17.0 g/dL 16.1  09.6  04.5   Hematocrit 39.0 - 52.0 % 37.6  41.6  42.6   Platelets 150 - 400 K/uL 163  163  187        Latest Ref Rng & Units 04/19/2023   10:15 AM 11/25/2022    7:34 AM 05/13/2022    9:17 AM  CMP  Glucose 70 - 99 mg/dL 578  469  96   BUN 6 - 23 mg/dL 22  22  19    Creatinine 0.40 - 1.50 mg/dL 6.29  5.28  4.13   Sodium 135 - 145 mEq/L 142  141  141   Potassium 3.5 - 5.1 mEq/L 4.4  3.8  5.0   Chloride 96 - 112 mEq/L 109  111  108   CO2 19 - 32 mEq/L 25  24  28    Calcium 8.4 - 10.5 mg/dL 9.8  9.8  24.4   Total Protein 6.5 - 8.1 g/dL  6.5  7.3   Total Bilirubin 0.3 - 1.2 mg/dL  0.4  0.5   Alkaline Phos 38 - 126 U/L  93  92   AST 15 - 41 U/L  41  29   ALT 0 - 44 U/L  34  29    RADIOGRAPHIC STUDIES: No results found.  ASSESSMENT & PLAN Douglas Hoffman 78 y.o. male with medical history significant for DVT/PE presents for a follow up visit.   Discussed possible provoking factors can cause venous thromboembolisms including prolonged travel/immobility, surgery (particular abdominal or orthropedic), trauma,  and pregnancy/ estrogen containing birth control. After a detailed history and review of the records there is no clear provoking factor for this patient's recent massive pulmonary embolism.  Patients with unprovoked VTEs have up to 25% recurrence after 5 years and 36% at 10 years, with 4% of these clots being fatal (BMJ [phone]). Therefore the formal recommendation for unprovoked VTE's is lifelong anticoagulation, as the cause may not be transient or reversible. We recommend 6 months or full strength anticoagulation  with a re-evaluation after that time.  The patient's will then have a choice of maintenance dose DOAC (preferred, recommended), 81mg  ASA PO daily (non-preferred), or no further anticoagulation (not recommended).    #Recurrent DVT/PE. --First episode in 2013 treated with warfarin, duration unknown --Second episode in June 2023, now on Eliqius. Felt to be unprovoked. --will order CMP and CBC at each visit to assure labs are adequate for DOAC therapy --Today show white blood cell count *** --recommend the patient continue eliquis 5mg  BID.  Offered transition to maintenance dosing but patient would like to continue on full-strength. --patient denies any bleeding, bruising, or dark stools on this medication. It is well tolerated. No difficulties accessing/affording the medication --RTC in 6 months' time with strict return precautions for overt signs of bleeding.    #Leukocytosis, resolved: -- Suspect inflammatory process secondary to hospitalization for submassive PE in June 2023. -- resolved, continue to monitor   No orders of the defined types were placed in this encounter.   All questions were answered. The patient knows to call the clinic with any problems, questions or concerns.  A total of more than 30 minutes were spent on this encounter with face-to-face time and non-face-to-face time, including preparing to see the patient, ordering tests and/or medications, counseling the patient and coordination of care as outlined above.   Ulysees Barns, MD Department of Hematology/Oncology [facility] at [facility] Phone: [phone] Pager: [phone] Email: [email or web address]  05/26/2023 7:28 AM

## 2023-05-27 ENCOUNTER — Telehealth: Payer: Self-pay | Admitting: Hematology and Oncology

## 2023-05-27 NOTE — Telephone Encounter (Signed)
Rescheduled appointments per 1/22 voicemail. Left the patient a voicemail with the new appointment date and time. Patient is active on MyChart and will receive an appointment reminder in the mail.

## 2023-06-03 ENCOUNTER — Inpatient Hospital Stay: Payer: PPO | Admitting: Hematology and Oncology

## 2023-06-03 ENCOUNTER — Inpatient Hospital Stay: Payer: PPO

## 2023-06-03 DIAGNOSIS — Z86718 Personal history of other venous thrombosis and embolism: Secondary | ICD-10-CM | POA: Diagnosis not present

## 2023-06-03 DIAGNOSIS — Z7901 Long term (current) use of anticoagulants: Secondary | ICD-10-CM | POA: Diagnosis not present

## 2023-06-03 DIAGNOSIS — Z86711 Personal history of pulmonary embolism: Secondary | ICD-10-CM

## 2023-06-03 DIAGNOSIS — D72829 Elevated white blood cell count, unspecified: Secondary | ICD-10-CM | POA: Diagnosis not present

## 2023-06-03 LAB — CMP (CANCER CENTER ONLY)
ALT: 23 U/L (ref 0–44)
AST: 26 U/L (ref 15–41)
Albumin: 4.1 g/dL (ref 3.5–5.0)
Alkaline Phosphatase: 90 U/L (ref 38–126)
Anion gap: 5 (ref 5–15)
BUN: 26 mg/dL — ABNORMAL HIGH (ref 8–23)
CO2: 26 mmol/L (ref 22–32)
Calcium: 9.8 mg/dL (ref 8.9–10.3)
Chloride: 109 mmol/L (ref 98–111)
Creatinine: 1.57 mg/dL — ABNORMAL HIGH (ref 0.61–1.24)
GFR, Estimated: 45 mL/min — ABNORMAL LOW (ref >60)
Glucose, Bld: 101 mg/dL — ABNORMAL HIGH (ref 70–99)
Potassium: 4.3 mmol/L (ref 3.5–5.1)
Sodium: 140 mmol/L (ref 135–145)
Total Bilirubin: 0.5 mg/dL (ref 0.0–1.2)
Total Protein: 7.4 g/dL (ref 6.5–8.1)

## 2023-06-03 LAB — CBC WITH DIFFERENTIAL (CANCER CENTER ONLY)
Abs Immature Granulocytes: 0.05 10*3/uL (ref 0.00–0.07)
Basophils Absolute: 0.1 10*3/uL (ref 0.0–0.1)
Basophils Relative: 1 %
Eosinophils Absolute: 0.4 10*3/uL (ref 0.0–0.5)
Eosinophils Relative: 5 %
HCT: 41.5 % (ref 39.0–52.0)
Hemoglobin: 13.5 g/dL (ref 13.0–17.0)
Immature Granulocytes: 1 %
Lymphocytes Relative: 20 %
Lymphs Abs: 1.6 10*3/uL (ref 0.7–4.0)
MCH: 30.5 pg (ref 26.0–34.0)
MCHC: 32.5 g/dL (ref 30.0–36.0)
MCV: 93.9 fL (ref 80.0–100.0)
Monocytes Absolute: 0.8 10*3/uL (ref 0.1–1.0)
Monocytes Relative: 11 %
Neutro Abs: 4.9 10*3/uL (ref 1.7–7.7)
Neutrophils Relative %: 62 %
Platelet Count: 193 10*3/uL (ref 150–400)
RBC: 4.42 MIL/uL (ref 4.22–5.81)
RDW: 13.2 % (ref 11.5–15.5)
WBC Count: 7.8 10*3/uL (ref 4.0–10.5)
nRBC: 0 % (ref 0.0–0.2)

## 2023-06-03 NOTE — Progress Notes (Signed)
[facility] [phone]   Fax:(336) [phone]  PROGRESS NOTE  Patient Care Team: [place], [name], MD as PCP - General (Family Medicine)  Hematological/Oncological History #DVT/PE: --12/22/2010: DVT in the distal femoral and popliteal veins. Treated with IV herapin then switched to lovenox therapy as bridge for coumadin. Uncertain length of treatment but was on coumadin until June 2013.  --10/06/2021: Submassive large clot main pulmonary artery PE status post IR directed catheter thrombolysis. Transitioned to Eliquis therapy --01/28/2022: establish care with Dr. Leonides Schanz.  #Leukocytosis: -10/01/2021: WBC 8.52, Hgb 14.4, Plt 139 (L),  -10/16/2021: WBC 16.68 (H), Hgb 10.8( L), MCV 97.4 (H), Plt 265K, ANC 12.4 (H) -11/21/2021: WBC 14.28 (H), Hgb 12.1, Plt 165K, ANC 11.1 (H) -01/28/2022: establish care with Dr. Leonides Schanz. Leukocytosis resolved.   Interval History:  Douglas Hoffman 78 y.o. male with medical history significant for DVT/PE presents for a follow up visit. The patient's last visit was on 11/25/2022. In the interim since the last visit he has continued on eliquis and his leukocytosis resolved.   On exam today [name] reports he has been well overall in the interim since her last visit.  He said no major changes in his health and denies any runny nose, sore throat, or cough.  He had no infectious symptoms.  His energy is good and his appetite is strong.  He continues taking his Eliquis twice daily as prescribed.  He is not having any trouble with bleeding, bruising, or dark stools.  He is not having any signs or symptoms concerning for recurrent VTE such as leg pain, leg swelling, chest pain, or shortness of breath.  He reports that he pays about a dollars for 4-month supply of his medication.  He has not started any other new medications.  Overall he feels well and has no questions concerns or complaints today.  He denies any fevers, chills, sweats, nausea, vomiting or  diarrhea.  A full 10 point ROS was otherwise negative.  MEDICAL HISTORY:  Past Medical History:  Diagnosis Date   Acute venous embolism and thrombosis of unspecified deep vessels of lower extremity    Arthritis    knee    Cancer (HCC)    Clotting disorder (HCC)    DVT many years  ago    Glaucoma    left eye since age 7   Gout    Hip pain    History of radiation therapy    Prostate   Hypertension    Prostate cancer [facility])    s/p radiation 36 fx   Renal disorder    reports prior to joint replacement surgery , was taking 5 to 6 aleve daily for pain releif, report shas stopped this and kidney function has omporved, kidney fx followed by his PCP       SURGICAL HISTORY: Past Surgical History:  Procedure Laterality Date   COLONOSCOPY  08/14/2010   normal    IR ANGIOGRAM PULMONARY BILATERAL SELECTIVE  10/06/2021   IR ANGIOGRAM SELECTIVE EACH ADDITIONAL VESSEL  10/06/2021   IR ANGIOGRAM SELECTIVE EACH ADDITIONAL VESSEL  10/06/2021   IR INFUSION THROMBOL ARTERIAL INITIAL (MS)  10/06/2021   IR INFUSION THROMBOL VENOUS INITIAL (MS)  10/06/2021   IR THROMB F/U EVAL ART/VEN FINAL DAY (MS)  10/07/2021   IR US GUIDE VASC ACCESS RIGHT  10/06/2021   JOINT REPLACEMENT     RIGHT Hip Replacement   JOINT REPLACEMENT     Knee Replacement   SUPRAPUBIC PROSTATECTOMY  TOTAL KNEE REVISION Left 07/07/2018   Procedure: TOTAL KNEE REVISION LEFT KNEE ARTHROPLASTY WITH FEMORAL AND TIBIAL COMPONENTS;  Surgeon: Samson Frederic, MD;  Location: [facility];  Service: Orthopedics;  Laterality: Left;    SOCIAL HISTORY: Social History   Socioeconomic History   Marital status: Married    Spouse name: Not on file   Number of children: Not on file   Years of education: Not on file   Highest education level: Not on file  Occupational History   Not on file  Tobacco Use   Smoking status: Never   Smokeless tobacco: Never  Vaping Use   Vaping status: Never Used  Substance and Sexual Activity   Alcohol use: No   Drug  use: No   Sexual activity: Not on file  Other Topics Concern   Not on file  Social History Narrative   Not on file   Social Drivers of Health   Financial Resource Strain: Not on file  Food Insecurity: Not on file  Transportation Needs: Not on file  Physical Activity: Not on file  Stress: Not on file  Social Connections: Not on file  Intimate Partner Violence: Not on file    FAMILY HISTORY: Family History  Problem Relation Age of Onset   Colon cancer Neg Hx    Colon polyps Neg Hx    Esophageal cancer Neg Hx    Rectal cancer Neg Hx    Stomach cancer Neg Hx     ALLERGIES:  is allergic to shellfish allergy.  MEDICATIONS:  Current Outpatient Medications  Medication Sig Dispense Refill   acetaminophen (TYLENOL) 500 MG tablet Take 1,000 mg by mouth every 6 (six) hours as needed (pain).     amLODipine (NORVASC) 5 MG tablet Take 5 mg by mouth every morning.     apixaban (ELIQUIS) 5 MG TABS tablet Take 1 tablet (5 mg total) by mouth 2 (two) times daily. 60 tablet 11   atorvastatin (LIPITOR) 80 MG tablet Take 1 tablet (80 mg total) by mouth daily. 30 tablet 11   ezetimibe (ZETIA) 10 MG tablet Take 1 tablet (10 mg total) by mouth daily. 90 tablet 3   triamcinolone cream (KENALOG) 0.1 % Apply 1 application. topically 2 (two) times daily as needed for rash.     No current facility-administered medications for this visit.    REVIEW OF SYSTEMS:   Constitutional: ( - ) fevers, ( - )  chills , ( - ) night sweats Eyes: ( - ) blurriness of vision, ( - ) double vision, ( - ) watery eyes Ears, nose, mouth, throat, and face: ( - ) mucositis, ( - ) sore throat Respiratory: ( - ) cough, ( - ) dyspnea, ( - ) wheezes Cardiovascular: ( - ) palpitation, ( - ) chest discomfort, ( - ) lower extremity swelling Gastrointestinal:  ( - ) nausea, ( - ) heartburn, ( - ) change in bowel habits Skin: ( - ) abnormal skin rashes Lymphatics: ( - ) new lymphadenopathy, ( - ) easy bruising Neurological: ( -  ) numbness, ( - ) tingling, ( - ) new weaknesses Behavioral/Psych: ( - ) mood change, ( - ) new changes  All other systems were reviewed with the patient and are negative.  PHYSICAL EXAMINATION:  There were no vitals filed for this visit.   There were no vitals filed for this visit.    GENERAL: well appearing elderly African-American male alert, no distress and comfortable SKIN: skin color, texture, turgor are  normal, no rashes or significant lesions EYES: conjunctiva are pink and non-injected, sclera clear LUNGS: clear to auscultation and percussion with normal breathing effort HEART: regular rate & rhythm and no murmurs and no lower extremity edema Musculoskeletal: no cyanosis of digits and no clubbing  PSYCH: alert & oriented x 3, fluent speech NEURO: no focal motor/sensory deficits  LABORATORY DATA:  I have reviewed the data as listed    Latest Ref Rng & Units 06/03/2023   10:12 AM 11/25/2022    7:34 AM 05/13/2022    9:17 AM  CBC  WBC 4.0 - 10.5 K/uL 7.8  7.2  8.1   Hemoglobin 13.0 - 17.0 g/dL 40.9  81.1  91.4   Hematocrit 39.0 - 52.0 % 41.5  37.6  41.6   Platelets 150 - 400 K/uL 193  163  163        Latest Ref Rng & Units 06/03/2023   10:12 AM 04/19/2023   10:15 AM 11/25/2022    7:34 AM  CMP  Glucose 70 - 99 mg/dL 782  956  213   BUN 8 - 23 mg/dL 26  22  22    Creatinine 0.61 - 1.24 mg/dL 0.86  5.78  4.69   Sodium 135 - 145 mmol/L 140  142  141   Potassium 3.5 - 5.1 mmol/L 4.3  4.4  3.8   Chloride 98 - 111 mmol/L 109  109  111   CO2 22 - 32 mmol/L 26  25  24    Calcium 8.9 - 10.3 mg/dL 9.8  9.8  9.8   Total Protein 6.5 - 8.1 g/dL 7.4   6.5   Total Bilirubin 0.0 - 1.2 mg/dL 0.5   0.4   Alkaline Phos 38 - 126 U/L 90   93   AST 15 - 41 U/L 26   41   ALT 0 - 44 U/L 23   34    RADIOGRAPHIC STUDIES: No results found.  ASSESSMENT & PLAN Douglas Hoffman 79 y.o. male with medical history significant for DVT/PE presents for a follow up visit.   Discussed possible  provoking factors can cause venous thromboembolisms including prolonged travel/immobility, surgery (particular abdominal or orthropedic), trauma,  and pregnancy/ estrogen containing birth control. After a detailed history and review of the records there is no clear provoking factor for this patient's recent massive pulmonary embolism.  Patients with unprovoked VTEs have up to 25% recurrence after 5 years and 36% at 10 years, with 4% of these clots being fatal (BMJ [phone]). Therefore the formal recommendation for unprovoked VTE's is lifelong anticoagulation, as the cause may not be transient or reversible. We recommend 6 months or full strength anticoagulation with a re-evaluation after that time.  The patient's will then have a choice of maintenance dose DOAC (preferred, recommended), 81mg  ASA PO daily (non-preferred), or no further anticoagulation (not recommended).    #Recurrent DVT/PE. --First episode in 2013 treated with warfarin, duration unknown --Second episode in June 2023, now on Eliqius. Felt to be unprovoked. --will order CMP and CBC at each visit to assure labs are adequate for DOAC therapy --Today show white blood cell count 7.8, hemoglobin 13.5, MCV 93.9, platelets 193 --recommend the patient continue eliquis 5mg  BID.  Offered transition to maintenance dosing but patient would like to continue on full-strength. --patient denies any bleeding, bruising, or dark stools on this medication. It is well tolerated. No difficulties accessing/affording the medication --RTC in 6 months' time with strict return precautions for overt signs of bleeding.    #  Leukocytosis, resolved: -- Suspect inflammatory process secondary to hospitalization for submassive PE in June 2023. -- resolved, continue to monitor   No orders of the defined types were placed in this encounter.   All questions were answered. The patient knows to call the clinic with any problems, questions or concerns.  A total of  more than 25 minutes were spent on this encounter with face-to-face time and non-face-to-face time, including preparing to see the patient, ordering tests and/or medications, counseling the patient and coordination of care as outlined above.   Ulysees Barns, MD Department of Hematology/Oncology [facility] at [facility] Phone: [phone] Pager: [phone] Email: [email or web address]  06/03/2023 1:58 PM

## 2023-06-28 ENCOUNTER — Telehealth: Payer: Self-pay

## 2023-06-28 NOTE — Telephone Encounter (Signed)
 I left pt a voicemail to call the office back. Need to know which cream and what he was using it for and if he needs it refilled.

## 2023-06-28 NOTE — Telephone Encounter (Signed)
 Copied from CRM [id number]. Topic: General - Other >> Jun 25, 2023 10:12 AM Elizebeth Brooking wrote: Reason for CRM: Patient called in wanting to speak with a nurse about a cream that he was to be prescribed to him is requesting a callback regarding this issue

## 2023-06-28 NOTE — Telephone Encounter (Signed)
 Pt has a visit scheduled for 2/28 for rash.

## 2023-07-01 ENCOUNTER — Ambulatory Visit (INDEPENDENT_AMBULATORY_CARE_PROVIDER_SITE_OTHER): Payer: PPO | Admitting: Pulmonary Disease

## 2023-07-01 ENCOUNTER — Encounter: Payer: Self-pay | Admitting: Pulmonary Disease

## 2023-07-01 VITALS — BP 138/64 | HR 73 | Temp 97.8°F | Ht 73.0 in | Wt 199.6 lb

## 2023-07-01 DIAGNOSIS — I2699 Other pulmonary embolism without acute cor pulmonale: Secondary | ICD-10-CM

## 2023-07-01 NOTE — Progress Notes (Signed)
 @Patient  ID: Douglas Hoffman, male    DOB: [birth year], 78 y.o.   MRN: [id number]  Chief Complaint  Patient presents with   Follow-up    Doing well.  No sx noted.    Referring provider: [place], [name], MD  HPI:   78 y.o. man whom we are seeing in follow-up after massive PE.  Most recent hematology note reviewed x2.  He is doing well.  No issues with dyspnea.  He states Eliquis is gotten expensive here later in the year.    HPI at initial visit Overall doing well.  Admitted to the hospital with progressive shortness of breath and chest pain.  Found to have large clot burden on CTA chest.  Reported syncope at home.  Underwent catheter directed lytics 10/06/21.  TTE 10/06/2021 demonstrated RV dysfunction with elevated right-sided pressures.  Had repeat TTE 10/09/2021 that showed resolution of RV dysfunction, normal RV function, normal estimated right-sided pressures.  Some confusion about apixaban dosing.  Completed the 2 tablet twice daily for a week.  Only been taking 1 tablet of 5 mg daily.  Not twice daily as prescribed.  This was clarified he understands, will take twice a day.  No issues with bleeding.  No melena or hematochezia.  No coffee-ground emesis etc.  No easy bruising.  No weakness.  Dyspnea are slowly improving.  No chest pain.  All gone.  Questionaires / Pulmonary Flowsheets:   ACT:      No data to display          MMRC:     No data to display          Epworth:      No data to display          Tests:   FENO:  No results found for: "NITRICOXIDE"  PFT:     No data to display          WALK:      No data to display          Imaging: Personally reviewed and as per EMR No results found.  Lab Results: Personally reviewed CBC    Component Value Date/Time   WBC 7.8 06/03/2023 1012   WBC 7.0 10/11/2021 0341   RBC 4.42 06/03/2023 1012   HGB 13.5 06/03/2023 1012   HCT 41.5 06/03/2023 1012   PLT 193 06/03/2023 1012   MCV 93.9 06/03/2023  1012   MCH 30.5 06/03/2023 1012   MCHC 32.5 06/03/2023 1012   RDW 13.2 06/03/2023 1012   LYMPHSABS 1.6 06/03/2023 1012   MONOABS 0.8 06/03/2023 1012   EOSABS 0.4 06/03/2023 1012   BASOSABS 0.1 06/03/2023 1012    BMET    Component Value Date/Time   NA 140 06/03/2023 1012   K 4.3 06/03/2023 1012   CL 109 06/03/2023 1012   CO2 26 06/03/2023 1012   GLUCOSE 101 (H) 06/03/2023 1012   BUN 26 (H) 06/03/2023 1012   CREATININE 1.57 (H) 06/03/2023 1012   CALCIUM 9.8 06/03/2023 1012   GFRNONAA 45 (L) 06/03/2023 1012   GFRAA 51 (L) 12/05/2018 1643    BNP    Component Value Date/Time   BNP 71.7 10/06/2021 0638    ProBNP No results found for: "PROBNP"  Specialty Problems   None   Allergies  Allergen Reactions   Shellfish Allergy Swelling    Gout     Immunization History  Administered Date(s) Administered   Influenza, High Dose Seasonal PF 02/07/2022   Influenza, Quadrivalent,  Recombinant, Inj, Pf 01/20/2023   Influenza,inj,quad, With Preservative 02/05/2018   PFIZER(Purple Top)SARS-COV-2 Vaccination 06/10/2019, 07/01/2019, 01/30/2020, 08/23/2020, 02/20/2021   PNEUMOCOCCAL CONJUGATE-20 10/16/2020   Pfizer(Comirnaty)Fall Seasonal Vaccine 12 years and older 02/04/2022, 01/08/2023   Pneumococcal Polysaccharide-23 10/31/2011   Tdap 03/16/2023   Zoster Recombinant(Shingrix) 10/16/2020, 12/16/2020    Past Medical History:  Diagnosis Date   Acute venous embolism and thrombosis of unspecified deep vessels of lower extremity    Arthritis    knee    Cancer (HCC)    Clotting disorder (HCC)    DVT many years  ago    Glaucoma    left eye since age 81   Gout    Hip pain    History of radiation therapy    Prostate   Hypertension    Prostate cancer (HCC)    s/p radiation 36 fx   Renal disorder    reports prior to joint replacement surgery , was taking 5 to 6 aleve daily for pain releif, report shas stopped this and kidney function has omporved, kidney fx followed by his PCP        Tobacco History: Social History   Tobacco Use  Smoking Status Never  Smokeless Tobacco Never   Counseling given: Not Answered   Continue to not smoke  Outpatient Encounter Medications as of 07/01/2023  Medication Sig   acetaminophen (TYLENOL) 500 MG tablet Take 1,000 mg by mouth every 6 (six) hours as needed (pain).   amLODipine (NORVASC) 5 MG tablet Take 5 mg by mouth every morning.   apixaban (ELIQUIS) 5 MG TABS tablet Take 1 tablet (5 mg total) by mouth 2 (two) times daily.   atorvastatin (LIPITOR) 80 MG tablet Take 1 tablet (80 mg total) by mouth daily.   ezetimibe (ZETIA) 10 MG tablet Take 1 tablet (10 mg total) by mouth daily.   triamcinolone cream (KENALOG) 0.1 % Apply 1 application. topically 2 (two) times daily as needed for rash.   No facility-administered encounter medications on file as of 07/01/2023.     Review of Systems  Review of Systems  N/a Physical Exam  BP 138/64 (BP Location: Left Arm, Patient Position: Sitting, Cuff Size: Normal)   Pulse 73   Temp 97.8 F (36.6 C) (Oral)   Ht 6\' 1"  (1.854 m)   Wt 199 lb 9.6 oz (90.5 kg)   SpO2 100%   BMI 26.33 kg/m   Wt Readings from Last 5 Encounters:  07/01/23 199 lb 9.6 oz (90.5 kg)  04/19/23 199 lb 6 oz (90.4 kg)  11/25/22 199 lb 4.8 oz (90.4 kg)  05/13/22 202 lb 1.6 oz (91.7 kg)  02/12/22 193 lb (87.5 kg)    BMI Readings from Last 5 Encounters:  07/01/23 26.33 kg/m  04/19/23 27.04 kg/m  11/25/22 27.03 kg/m  05/13/22 27.41 kg/m  02/12/22 26.18 kg/m     Physical Exam General: Well-appearing, no acute distress Eyes: EOMI, icterus Neck: Supple, no JVP Pulmonary: normal work of breathing Cardiovascular warm, no edema MSK: No synovitis, no joint effusion, joint replacement scars on knees Abdomen: Nondistended Neuro: Normal gait, no weakness Psych: Normal mood, full affect.   Assessment & Plan:   Massive PE: With syncope.  Status post catheter directed lytics 10/06/2021.  Second VTE  in life.  Recommend lifelong anticoagulation which he expresses understanding.  Can continue apixaban 5 mg twice a day.  Denies significant dyspnea.  No chest pain.  Receiving Eliquis via hematology office and PCP.  No need for pulmonary follow-up.  Patient agreed and voiced understanding.  RV dysfunction: Acute in the setting of massive PE.  Resolved on limited TTE 3 days after lytic therapy.  Normal RV function with normal estimated RVSP.  No further follow-up needed.   Return if symptoms worsen or fail to improve.   Karren Burly, MD 07/01/2023

## 2023-07-02 ENCOUNTER — Encounter: Payer: Self-pay | Admitting: Family Medicine

## 2023-07-02 ENCOUNTER — Ambulatory Visit (INDEPENDENT_AMBULATORY_CARE_PROVIDER_SITE_OTHER): Payer: PPO | Admitting: Family Medicine

## 2023-07-02 VITALS — BP 120/80 | HR 73 | Resp 16 | Ht 73.0 in | Wt 199.0 lb

## 2023-07-02 DIAGNOSIS — L2989 Other pruritus: Secondary | ICD-10-CM | POA: Diagnosis not present

## 2023-07-02 MED ORDER — TRIAMCINOLONE ACETONIDE 0.1 % EX CREA: 1.0000 | TOPICAL_CREAM | Freq: Two times a day (BID) | CUTANEOUS | 0 refills | Status: DC | PRN

## 2023-07-02 NOTE — Patient Instructions (Addendum)
 A few things to remember from today's visit:  Chronic pruritic rash in adult - Plan: triamcinolone cream (KENALOG) 0.1 % No changes today. Small amount at the time.  If you need refills for medications you take chronically, please call your pharmacy. Do not use My Chart to request refills or for acute issues that need immediate attention. If you send a my chart message, it may take a few days to be addressed, specially if I am not in the office.  Please be sure medication list is accurate. If a new problem present, please set up appointment sooner than planned today.

## 2023-07-02 NOTE — Progress Notes (Signed)
 ACUTE VISIT Chief Complaint  Patient presents with   Medication Refill    Would like a refill on his triamcinolone cream    HPI: [name] is a 77 y.o. male with a PMHx significant for HTN, DVT, NSTEMI, CKD, prostate cancer, and gout, among some, who is here today complaining of a rash  Patient complains of a pruritic rash on both of his legs for awhile. He says it flares up occasionally.  He has not identified exacerbating or alleviating factors.  He would like a refill of his triamcinolone cream, which was prescribed in the past for this problem and has helped.  He has seen dermatology in the past, he does not think this problem was discussed. It was managed by his former PCP and not sure about diagnosis.  Last visit with dermatologist in 12/2022, Dr Roderic Scarce.  Negative for lower extremity pain, edema, wounds, or cyanosis.  Since his last visit he has seen his pulmonologist and oncologist.  Review of Systems  Constitutional:  Negative for chills and fever.  HENT:  Negative for mouth sores and sore throat.   Respiratory:  Negative for cough and shortness of breath.   Gastrointestinal:  Negative for abdominal pain and nausea.  Musculoskeletal:  Negative for gait problem and myalgias.  Skin:  Positive for rash. Negative for wound.  See other pertinent positives and negatives in HPI.  Current Outpatient Medications on File Prior to Visit  Medication Sig Dispense Refill   acetaminophen (TYLENOL) 500 MG tablet Take 1,000 mg by mouth every 6 (six) hours as needed (pain).     amLODipine (NORVASC) 5 MG tablet Take 5 mg by mouth every morning.     apixaban (ELIQUIS) 5 MG TABS tablet Take 1 tablet (5 mg total) by mouth 2 (two) times daily. 60 tablet 11   atorvastatin (LIPITOR) 80 MG tablet Take 1 tablet (80 mg total) by mouth daily. 30 tablet 11   ezetimibe (ZETIA) 10 MG tablet Take 1 tablet (10 mg total) by mouth daily. 90 tablet 3   No current facility-administered medications on  file prior to visit.   Past Medical History:  Diagnosis Date   Acute venous embolism and thrombosis of unspecified deep vessels of lower extremity    Arthritis    knee    Cancer (HCC)    Clotting disorder (HCC)    DVT many years  ago    Glaucoma    left eye since age 13   Gout    Hip pain    History of radiation therapy    Prostate   Hypertension    Prostate cancer [facility])    s/p radiation 36 fx   Renal disorder    reports prior to joint replacement surgery , was taking 5 to 6 aleve daily for pain releif, report shas stopped this and kidney function has omporved, kidney fx followed by his PCP      Allergies  Allergen Reactions   Shellfish Allergy Swelling    Gout    Social History   Socioeconomic History   Marital status: Married    Spouse name: Not on file   Number of children: Not on file   Years of education: Not on file   Highest education level: Not on file  Occupational History   Not on file  Tobacco Use   Smoking status: Never   Smokeless tobacco: Never  Vaping Use   Vaping status: Never Used  Substance and Sexual Activity   Alcohol use:  No   Drug use: No   Sexual activity: Not on file  Other Topics Concern   Not on file  Social History Narrative   Not on file   Social Drivers of Health   Financial Resource Strain: Not on file  Food Insecurity: Not on file  Transportation Needs: Not on file  Physical Activity: Not on file  Stress: Not on file  Social Connections: Not on file   Vitals:   07/02/23 1010  BP: 120/80  Pulse: 73  Resp: 16  SpO2: 98%   Body mass index is 26.25 kg/m.  Physical Exam Vitals and nursing note reviewed.  Constitutional:      General: He is not in acute distress.    Appearance: He is well-developed.  HENT:     Head: Normocephalic and atraumatic.  Eyes:     Conjunctiva/sclera: Conjunctivae normal.  Cardiovascular:     Rate and Rhythm: Normal rate and regular rhythm.     Heart sounds: No murmur heard. Pulmonary:      Effort: Pulmonary effort is normal. No respiratory distress.     Breath sounds: Normal breath sounds.  Skin:    General: Skin is warm.     Findings: Rash present. No erythema.     Comments: Confluent papular flat lesions on pretibial areas, signs of skin lichenification. No erythema or induration.  Neurological:     Mental Status: He is alert and oriented to person, place, and time.     Gait: Gait normal.  Psychiatric:        Mood and Affect: Mood and affect normal.   ASSESSMENT AND PLAN:  Douglas Hoffman was seen today for a rash.   Chronic pruritic rash in adult -     Triamcinolone Acetonide; Apply 1 Application topically 2 (two) times daily as needed.  Dispense: 453.6 g; Refill: 0  It  seems to be stable. We discussed possible etiologies. ?  Venostasis dermatitis versus eczema. He has seen dermatology but not for this problem. Triamcinolone cream has helped, so continue, small amount at the time, twice daily as needed.  We discussed some side effects of chronic topical steroid. If problem gets worse, recommend arranging appointment with dermatologist. Monitor for changes  Return if symptoms worsen or fail to improve, for keep next appointment.  I, Douglas Hoffman, acting as a scribe for Douglas Girdler [place], MD., have documented all relevant documentation on the behalf of Douglas Cossin [place], MD, as directed by  Douglas Conrey [place], MD while in the presence of Douglas Mcveigh [place], MD.   I, Douglas Avitia [place], MD, have reviewed all documentation for this visit. The documentation on 07/02/23 for the exam, diagnosis, procedures, and orders are all accurate and complete.  Douglas Reppond G. [place], MD  [facility]. [place] office.

## 2023-07-05 DIAGNOSIS — R351 Nocturia: Secondary | ICD-10-CM | POA: Diagnosis not present

## 2023-07-05 DIAGNOSIS — N401 Enlarged prostate with lower urinary tract symptoms: Secondary | ICD-10-CM | POA: Diagnosis not present

## 2023-07-05 DIAGNOSIS — N3941 Urge incontinence: Secondary | ICD-10-CM | POA: Diagnosis not present

## 2023-07-05 DIAGNOSIS — Z8546 Personal history of malignant neoplasm of prostate: Secondary | ICD-10-CM | POA: Diagnosis not present

## 2023-07-05 LAB — PSA: PSA: <0.015

## 2023-07-22 ENCOUNTER — Ambulatory Visit: Payer: PPO | Admitting: Family Medicine

## 2023-08-11 ENCOUNTER — Ambulatory Visit

## 2023-08-11 ENCOUNTER — Telehealth: Payer: Self-pay

## 2023-08-11 NOTE — Telephone Encounter (Signed)
 Unsuccessful attempts to reach patient on preferred number listed in notes for scheduled AWV. Left message on voicemail okay to reschedule.

## 2023-10-26 ENCOUNTER — Ambulatory Visit

## 2023-11-08 NOTE — Progress Notes (Signed)
 HPI: [name] is a 78 y.o.male with a PMHx significant for HTN, DVT, NSTEMI, CKD III, prostate cancer, and gout, among some, here today for his routine physical examination.  Last CPE: 01/20/2023 w/ Dr. Glendia Hoffman, established care in this office on 04/19/2023, and was last seen 07/02/2023.  Exercise: walking via work Diet: mostly home-cooking w/ daily vegetables Sleep: 4-5 hours nightly, schedule difficulties Alcohol  Use: none  Smoking: never Vision: UTD - notes his regular eye doctor has retired, so he is currently looking for a new one. Dental: UTD  Chronic medical problems:   Hypertension:  Medications: Amlodipine  5 mg daily BP readings at home: N/A  Lab Results  Component Value Date   CREATININE 1.57 (H) 06/03/2023   BUN 26 (H) 06/03/2023   NA 140 06/03/2023   K 4.3 06/03/2023   CL 109 06/03/2023   CO2 26 06/03/2023   Hyperlipidemia: Currently on Atorvastatin  80 mg daily and Zetia  10 mg daily.   CAD; NSTEMI in 2020. Denies CP, palpitations, DOE. Lab Results  Component Value Date   CHOL 178 04/19/2023   HDL 39.60 04/19/2023   LDLCALC 121 (H) 04/19/2023   TRIG 88.0 04/19/2023   CHOLHDL 4 04/19/2023   Prediabetes:  Negative for symptoms of hypoglycemia, polyuria, polydipsia, numbness extremities, foot ulcers/trauma. Lab Results  Component Value Date   HGBA1C 6.3 04/19/2023   CKD III: Negative for gross hematuria, foam in urine, or decreased urine output. Lab Results  Component Value Date   NA 140 06/03/2023   CL 109 06/03/2023   K 4.3 06/03/2023   CO2 26 06/03/2023   BUN 26 (H) 06/03/2023   CREATININE 1.57 (H) 06/03/2023   GFRNONAA 45 (L) 06/03/2023   CALCIUM  9.8 06/03/2023   PHOS 2.1 (L) 10/07/2021   ALBUMIN  4.1 06/03/2023   GLUCOSE 101 (H) 06/03/2023   Prostate Cancer: S/p Prostatectomy, and is followed by Urology annually. Urology - Douglas Bernardino Eagles, NP, last seen 07/05/2023  Hx of DVT & PE: Taking Eliquis  5 mg twice daily.   Followed by Oncologist, Dr. Norleen Hoffman and Pulmonologist, Dr. Donnice Hoffman.  DVT in distal femoral and popliteal veins noted 12/22/2010.  PE noted 10/06/2021 was submassive large clot in main pulmonary artery, s/p IR directed catheter thrombolysis.  04/20/2023 VAS US  ABI WITH/ WO TBI summary:  1.) Right: Resting right ankle- brachial index WNL. The right toe- brachial index WNL.  2.) Left: The left toe- brachial index WNL. Resting left ankle- brachial index WNL, however despite ankle brachial indices being within normal limits ( 0. 95- 1. 29), arterial Doppler waveforms at the ankle suggest some component of arterial occlusive disease. Denies claudication like symptoms.  Prediabetes: Negative for polydipsia, polyuria,and polyphagia. Lab Results  Component Value Date   HGBA1C 6.3 04/19/2023   Immunization History  Administered Date(s) Administered   Influenza, High Dose Seasonal PF 02/07/2022   Influenza, Quadrivalent, Recombinant, Inj, Pf 01/20/2023   Influenza,inj,quad, With Preservative 02/05/2018   PFIZER(Purple Top)SARS-COV-2 Vaccination 06/10/2019, 07/01/2019, 01/30/2020, 08/23/2020, 02/20/2021   PNEUMOCOCCAL CONJUGATE-20 10/16/2020   Pfizer(Comirnaty)Fall Seasonal Vaccine 12 years and older 02/04/2022, 01/08/2023   Pneumococcal Polysaccharide-23 10/31/2011   Tdap 03/16/2023   Zoster Recombinant(Shingrix) 10/16/2020, 12/16/2020    Health Maintenance  Topic Date Due   Medicare Annual Wellness (AWV)  10/26/2023   COVID-19 Vaccine (8 - Pfizer risk 2024-25 season) 11/26/2023 (Originally 07/08/2023)   INFLUENZA VACCINE  12/03/2023   DTaP/Tdap/Td (2 - Td or Tdap) 03/15/2033   Pneumococcal Vaccine: 50+ Years  Completed   Hepatitis C Screening  Completed   Zoster Vaccines- Shingrix  Completed   Hepatitis B Vaccines  Aged Out   HPV VACCINES  Aged Out   Meningococcal B Vaccine  Aged Out   Colonoscopy  Discontinued   Review of Systems  Constitutional:  Negative for activity  change, appetite change, fatigue and fever.  HENT:  Negative for nosebleeds, sore throat and trouble swallowing.   Eyes:  Negative for redness and visual disturbance.  Respiratory:  Negative for cough, shortness of breath and wheezing.   Cardiovascular:  Negative for chest pain, palpitations and leg swelling.  Gastrointestinal:  Negative for abdominal pain, blood in stool, nausea and vomiting.  Endocrine: Negative for cold intolerance, heat intolerance, polydipsia, polyphagia and polyuria.  Genitourinary:  Negative for decreased urine volume, dysuria, genital sores, hematuria and testicular pain.  Musculoskeletal:  Positive for arthralgias. Negative for joint swelling.  Skin:  Negative for color change and rash.  Neurological:  Negative for syncope, weakness and headaches.  Hematological:  Negative for adenopathy. Does not bruise/bleed easily.  Psychiatric/Behavioral:  Negative for confusion. The patient is not nervous/anxious.    Current Outpatient Medications on File Prior to Visit  Medication Sig Dispense Refill   acetaminophen  (TYLENOL ) 500 MG tablet Take 1,000 mg by mouth every 6 (six) hours as needed (pain).     amLODipine  (NORVASC ) 5 MG tablet Take 5 mg by mouth every morning.     apixaban  (ELIQUIS ) 5 MG TABS tablet Take 1 tablet (5 mg total) by mouth 2 (two) times daily. 60 tablet 11   atorvastatin  (LIPITOR ) 80 MG tablet Take 1 tablet (80 mg total) by mouth daily. 30 tablet 11   ezetimibe  (ZETIA ) 10 MG tablet Take 1 tablet (10 mg total) by mouth daily. 90 tablet 3   triamcinolone  cream (KENALOG ) 0.1 % Apply 1 Application topically 2 (two) times daily as needed. 453.6 g 0   No current facility-administered medications on file prior to visit.   Past Medical History:  Diagnosis Date   Acute venous embolism and thrombosis of unspecified deep vessels of lower extremity    Arthritis    knee    Cancer (HCC)    Cataract    Clotting disorder (HCC)    DVT many years  ago    Glaucoma     left eye since age 53   Gout    Hip pain    History of radiation therapy    Prostate   Hypertension    Prostate cancer [facility])    s/p radiation 36 fx   Renal disorder    reports prior to joint replacement surgery , was taking 5 to 6 aleve daily for pain releif, report shas stopped this and Hoffman function has omporved, Hoffman fx followed by his PCP       Past Surgical History:  Procedure Laterality Date   COLONOSCOPY  08/14/2010   normal    IR ANGIOGRAM PULMONARY BILATERAL SELECTIVE  10/06/2021   IR ANGIOGRAM SELECTIVE EACH ADDITIONAL VESSEL  10/06/2021   IR ANGIOGRAM SELECTIVE EACH ADDITIONAL VESSEL  10/06/2021   IR INFUSION THROMBOL ARTERIAL INITIAL (MS)  10/06/2021   IR INFUSION THROMBOL VENOUS INITIAL (MS)  10/06/2021   IR THROMB F/U EVAL ART/VEN FINAL DAY (MS)  10/07/2021   IR US  GUIDE VASC ACCESS RIGHT  10/06/2021   JOINT REPLACEMENT     RIGHT Hip Replacement   JOINT REPLACEMENT     Knee Replacement   SUPRAPUBIC PROSTATECTOMY  TOTAL KNEE REVISION Left 07/07/2018   Procedure: TOTAL KNEE REVISION LEFT KNEE ARTHROPLASTY WITH FEMORAL AND TIBIAL COMPONENTS;  Surgeon: Fidel Rogue, MD;  Location: [facility];  Service: Orthopedics;  Laterality: Left;    Allergies  Allergen Reactions   Shellfish Allergy Swelling    Gout     Family History  Problem Relation Age of Onset   Colon cancer Neg Hx    Colon polyps Neg Hx    Esophageal cancer Neg Hx    Rectal cancer Neg Hx    Stomach cancer Neg Hx     Social History   Socioeconomic History   Marital status: Married    Spouse name: Not on file   Number of children: Not on file   Years of education: Not on file   Highest education level: 12th grade  Occupational History   Not on file  Tobacco Use   Smoking status: Never   Smokeless tobacco: Never  Vaping Use   Vaping status: Never Used  Substance and Sexual Activity   Alcohol  use: No   Drug use: No   Sexual activity: Not on file  Other Topics Concern   Not on file  Social  History Narrative   Not on file   Social Drivers of Health   Financial Resource Strain: Low Risk  (08/10/2023)   Overall Financial Resource Strain (CARDIA)    Difficulty of Paying Living Expenses: Not hard at all  Food Insecurity: No Food Insecurity (08/10/2023)   Hunger Vital Sign    Worried About Running Out of Food in the Last Year: Never true    Ran Out of Food in the Last Year: Never true  Transportation Needs: No Transportation Needs (08/10/2023)   PRAPARE - Administrator, Civil Service (Medical): No    Lack of Transportation (Non-Medical): No  Physical Activity: Insufficiently Active (08/10/2023)   Exercise Vital Sign    Days of Exercise per Week: 3 days    Minutes of Exercise per Session: 30 min  Stress: No Stress Concern Present (08/10/2023)   [employer] of Occupational Health - Occupational Stress Questionnaire    Feeling of Stress : Not at all  Social Connections: Unknown (08/10/2023)   Social Connection and Isolation Panel    Frequency of Communication with Friends and Family: More than three times a week    Frequency of Social Gatherings with Friends and Family: Once a week    Attends Religious Services: More than 4 times per year    Active Member of [employer] or Organizations: Not on file    Attends Banker Meetings: Not on file    Marital Status: Married    Vitals:   11/10/23 0748  BP: 136/80  Pulse: 70  Resp: 16  Temp: 98.2 F (36.8 C)  SpO2: 97%   Body mass index is 25.99 kg/m.  Wt Readings from Last 3 Encounters:  11/10/23 197 lb (89.4 kg)  07/02/23 199 lb (90.3 kg)  07/01/23 199 lb 9.6 oz (90.5 kg)   Physical Exam Vitals and nursing note reviewed.  Constitutional:      General: He is not in acute distress.    Appearance: He is well-developed.  HENT:     Head: Normocephalic and atraumatic.     Right Ear: External ear normal. Tympanic membrane is not erythematous.     Left Ear: Tympanic membrane, ear canal and external ear  normal.     Ears:     Comments: Cerumen excess right  ear canal, TM seen partially. Eyes:     General: Lids are normal. Gaze aligned appropriately.     Conjunctiva/sclera: Conjunctivae normal.     Pupils:     Right eye: Pupil is reactive.     Left eye: Pupil is not reactive.     Comments: Possible cataract RIGHT EYE noted  Neck:     Thyroid : No thyroid  mass or thyromegaly.  Cardiovascular:     Rate and Rhythm: Normal rate and regular rhythm.     Pulses:          Dorsalis pedis pulses are 2+ on the left side.       Posterior tibial pulses are 2+ on the right side.     Heart sounds: No murmur heard.    Comments: Right DP palpable, difficult to find. Pulmonary:     Effort: Pulmonary effort is normal. No respiratory distress.     Breath sounds: Normal breath sounds.  Abdominal:     Palpations: Abdomen is soft. There is no hepatomegaly or mass.     Tenderness: There is no abdominal tenderness.  Genitourinary:    Comments: No concerns. Musculoskeletal:        General: No tenderness.     Cervical back: Normal range of motion.     Right lower leg: Edema (trace) present.     Left lower leg: Edema (trace) present.     Comments: No signs of synovitis.  Lymphadenopathy:     Cervical: No cervical adenopathy.  Skin:    General: Skin is warm.     Findings: No erythema.  Neurological:     General: No focal deficit present.     Mental Status: He is alert and oriented to person, place, and time.     Cranial Nerves: No cranial nerve deficit.     Deep Tendon Reflexes:     Reflex Scores:      Bicep reflexes are 0 on the right side and 2+ on the left side.      Patellar reflexes are 2+ on the right side and 2+ on the left side.    Comments: Otherwise stable gait, mild limping, not assisted.  Psychiatric:        Mood and Affect: Mood and affect normal.   ASSESSMENT AND PLAN: [name] was seen here today for his routine physical examination. Orders Placed This Encounter   Procedures   Microalbumin / creatinine urine ratio   Basic metabolic panel with GFR   VITAMIN D  25 Hydroxy (Vit-D Deficiency, Fractures)   Lipid panel   Hemoglobin A1c   Lab Results  Component Value Date   HGBA1C 6.6 (H) 11/10/2023   Lab Results  Component Value Date   MICROALBUR 4.4 (H) 11/10/2023   Lab Results  Component Value Date   NA 139 11/10/2023   CL 106 11/10/2023   K 4.2 11/10/2023   CO2 25 11/10/2023   BUN 26 (H) 11/10/2023   CREATININE 1.74 (H) 11/10/2023   GFR 37.14 (L) 11/10/2023   CALCIUM  10.3 11/10/2023   PHOS 2.1 (L) 10/07/2021   ALBUMIN  4.1 06/03/2023   GLUCOSE 105 (H) 11/10/2023   Lab Results  Component Value Date   CHOL 113 11/10/2023   HDL 40.30 11/10/2023   LDLCALC 57 11/10/2023   TRIG 81.0 11/10/2023   CHOLHDL 3 11/10/2023   Lab Results  Component Value Date   VD25OH 39.26 11/10/2023   Routine general medical examination at a health care facility Assessment & Plan: We  discussed the importance of regular physical activity and healthy diet for prevention of chronic illness and/or complications. Preventive guidelines reviewed. Vaccination up to date. Next CPE in a year.   Primary hypertension Assessment & Plan: Overall BP adequately controlled. Recommend monitoring BP at home. Continue Amlodipine  5 mg daily and low-salt diet. Follow-up in 6 months.  Orders: -     Basic metabolic panel with GFR; Future  Stage 3a chronic Hoffman disease (HCC) Assessment & Plan: Problem has been stable. Continue adequate hydration, low-salt diet, and avoidance of NSAIDs.  Orders: -     Microalbumin / creatinine urine ratio; Future -     Basic metabolic panel with GFR; Future -     VITAMIN D  25 Hydroxy (Vit-D Deficiency, Fractures); Future  Prediabetes Assessment & Plan: Encourage consistency with a healthy lifestyle for diabetes prevention. Further recommendation will be given according to hemoglobin A1c result.  Orders: -     Hemoglobin  A1c; Future  Hyperlipidemia, unspecified hyperlipidemia type Assessment & Plan: Last LDL 121 in 04/2023, far from goal. Currently he is on atorvastatin  80 mg daily and Zetia  10 mg daily. Further recommendation will be given according to lipid panel result.  Orders: -     Lipid panel; Future  Malignant neoplasm of prostate [facility]) Assessment & Plan: Status post prostate surgery. Follows with urologist annually.   Return in 6 months (on 05/12/2024) for chronic problems.  I,Emily Lagle,acting as a Neurosurgeon for Radek Carnero [place], MD.,have documented all relevant documentation on the behalf of Heather Streeper [place], MD,as directed by  Grettel Rames [place], MD while in the presence of Taron Conrey [place], MD.  I, Suren Payne [place], MD, have reviewed all documentation for this visit. The documentation on 11/10/23 for the exam, diagnosis, procedures, and orders are all accurate and complete. Ramani Riva G. [place], MD  [facility]. [place] office.

## 2023-11-10 ENCOUNTER — Ambulatory Visit: Payer: Self-pay | Admitting: Family Medicine

## 2023-11-10 ENCOUNTER — Encounter: Payer: Self-pay | Admitting: Family Medicine

## 2023-11-10 ENCOUNTER — Ambulatory Visit (INDEPENDENT_AMBULATORY_CARE_PROVIDER_SITE_OTHER): Admitting: Family Medicine

## 2023-11-10 VITALS — BP 136/80 | HR 70 | Temp 98.2°F | Resp 16 | Ht 73.0 in | Wt 197.0 lb

## 2023-11-10 DIAGNOSIS — E785 Hyperlipidemia, unspecified: Secondary | ICD-10-CM | POA: Diagnosis not present

## 2023-11-10 DIAGNOSIS — R7303 Prediabetes: Secondary | ICD-10-CM | POA: Diagnosis not present

## 2023-11-10 DIAGNOSIS — C61 Malignant neoplasm of prostate: Secondary | ICD-10-CM | POA: Diagnosis not present

## 2023-11-10 DIAGNOSIS — Z0000 Encounter for general adult medical examination without abnormal findings: Secondary | ICD-10-CM | POA: Insufficient documentation

## 2023-11-10 DIAGNOSIS — E1122 Type 2 diabetes mellitus with diabetic chronic kidney disease: Secondary | ICD-10-CM | POA: Insufficient documentation

## 2023-11-10 DIAGNOSIS — I10 Essential (primary) hypertension: Secondary | ICD-10-CM

## 2023-11-10 DIAGNOSIS — N1831 Chronic kidney disease, stage 3a: Secondary | ICD-10-CM | POA: Diagnosis not present

## 2023-11-10 LAB — LIPID PANEL
Cholesterol: 113 mg/dL (ref 0–200)
HDL: 40.3 mg/dL (ref >39.00)
LDL Cholesterol: 57 mg/dL (ref 0–99)
NonHDL: 72.98
Total CHOL/HDL Ratio: 3
Triglycerides: 81 mg/dL (ref 0.0–149.0)
VLDL: 16.2 mg/dL (ref 0.0–40.0)

## 2023-11-10 LAB — HEMOGLOBIN A1C: Hgb A1c MFr Bld: 6.6 % — ABNORMAL HIGH (ref 4.6–6.5)

## 2023-11-10 LAB — BASIC METABOLIC PANEL WITH GFR
BUN: 26 mg/dL — ABNORMAL HIGH (ref 6–23)
CO2: 25 meq/L (ref 19–32)
Calcium: 10.3 mg/dL (ref 8.4–10.5)
Chloride: 106 meq/L (ref 96–112)
Creatinine, Ser: 1.74 mg/dL — ABNORMAL HIGH (ref 0.40–1.50)
GFR: 37.14 mL/min — ABNORMAL LOW (ref >60.00)
Glucose, Bld: 105 mg/dL — ABNORMAL HIGH (ref 70–99)
Potassium: 4.2 meq/L (ref 3.5–5.1)
Sodium: 139 meq/L (ref 135–145)

## 2023-11-10 LAB — VITAMIN D 25 HYDROXY (VIT D DEFICIENCY, FRACTURES): VITD: 39.26 ng/mL (ref 30.00–100.00)

## 2023-11-10 LAB — MICROALBUMIN / CREATININE URINE RATIO
Creatinine,U: 82.7 mg/dL
Microalb Creat Ratio: 53.2 mg/g — ABNORMAL HIGH (ref 0.0–30.0)
Microalb, Ur: 4.4 mg/dL — ABNORMAL HIGH (ref 0.0–1.9)

## 2023-11-10 NOTE — Patient Instructions (Addendum)
 A few things to remember from today's visit:  Routine general medical examination at a health care facility  Primary hypertension - Plan: Basic metabolic panel with GFR  Stage 3a chronic kidney disease (HCC) - Plan: Microalbumin / creatinine urine ratio, Basic metabolic panel with GFR, VITAMIN D  25 Hydroxy (Vit-D Deficiency, Fractures)  Prediabetes - Plan: Hemoglobin A1c  Hyperlipidemia, unspecified hyperlipidemia type - Plan: Lipid panel  No changes today. Arrange appt with new eye care provider.  If you need refills for medications you take chronically, please call your pharmacy. Do not use My Chart to request refills or for acute issues that need immediate attention. If you send a my chart message, it may take a few days to be addressed, specially if I am not in the office.  Please be sure medication list is accurate. If a new problem present, please set up appointment sooner than planned today.

## 2023-11-10 NOTE — Assessment & Plan Note (Signed)
Encourage consistency with a healthy lifestyle for diabetes prevention. Further recommendation will be given according to hemoglobin A1c result. 

## 2023-11-10 NOTE — Assessment & Plan Note (Signed)
 Status post prostate surgery. Follows with urologist annually.

## 2023-11-10 NOTE — Assessment & Plan Note (Signed)
 Last LDL 121 in 04/2023, far from goal. Currently he is on atorvastatin  80 mg daily and Zetia  10 mg daily. Further recommendation will be given according to lipid panel result.

## 2023-11-10 NOTE — Assessment & Plan Note (Signed)
We discussed the importance of regular physical activity and healthy diet for prevention of chronic illness and/or complications. Preventive guidelines reviewed. Vaccination up to date. Next CPE in a year. 

## 2023-11-10 NOTE — Assessment & Plan Note (Signed)
Problem has been stable. Continue adequate hydration, low-salt diet, and avoidance of NSAIDs.

## 2023-11-10 NOTE — Assessment & Plan Note (Signed)
 Overall BP adequately controlled. Recommend monitoring BP at home. Continue Amlodipine  5 mg daily and low-salt diet. Follow-up in 6 months.

## 2023-11-12 ENCOUNTER — Other Ambulatory Visit: Payer: Self-pay

## 2023-11-12 MED ORDER — DAPAGLIFLOZIN PROPANEDIOL 10 MG PO TABS: 10.0000 mg | ORAL_TABLET | Freq: Every day | ORAL | 3 refills | Status: DC

## 2023-11-15 ENCOUNTER — Telehealth: Payer: Self-pay

## 2023-11-15 NOTE — Telephone Encounter (Signed)
 I called and spoke with pt. He is aware that Rx is for Farxiga , not an antibiotic. He will follow up with the pharmacy.

## 2023-11-15 NOTE — Telephone Encounter (Signed)
 Copied from CRM [id number]. Topic: Clinical - Medication Question >> Nov 15, 2023  9:40 AM [place] M wrote: Reason for CRM: Patient calling to check on a medication that was supposed to be called in at his last visit for an antibiotic for his urine. Please reach out to patient

## 2023-12-01 ENCOUNTER — Inpatient Hospital Stay (HOSPITAL_BASED_OUTPATIENT_CLINIC_OR_DEPARTMENT_OTHER): Payer: PPO | Admitting: Hematology and Oncology

## 2023-12-01 ENCOUNTER — Inpatient Hospital Stay: Payer: PPO | Attending: Hematology and Oncology

## 2023-12-01 ENCOUNTER — Other Ambulatory Visit: Payer: Self-pay | Admitting: Hematology and Oncology

## 2023-12-01 VITALS — BP 132/83 | HR 65 | Temp 97.8°F | Resp 13 | Wt 192.4 lb

## 2023-12-01 DIAGNOSIS — Z86711 Personal history of pulmonary embolism: Secondary | ICD-10-CM

## 2023-12-01 DIAGNOSIS — D72828 Other elevated white blood cell count: Secondary | ICD-10-CM | POA: Diagnosis not present

## 2023-12-01 DIAGNOSIS — Z7901 Long term (current) use of anticoagulants: Secondary | ICD-10-CM | POA: Diagnosis not present

## 2023-12-01 DIAGNOSIS — D72829 Elevated white blood cell count, unspecified: Secondary | ICD-10-CM | POA: Insufficient documentation

## 2023-12-01 DIAGNOSIS — Z86718 Personal history of other venous thrombosis and embolism: Secondary | ICD-10-CM | POA: Insufficient documentation

## 2023-12-01 DIAGNOSIS — N189 Chronic kidney disease, unspecified: Secondary | ICD-10-CM | POA: Insufficient documentation

## 2023-12-01 LAB — CBC WITH DIFFERENTIAL (CANCER CENTER ONLY)
Abs Immature Granulocytes: 0.03 K/uL (ref 0.00–0.07)
Basophils Absolute: 0.1 K/uL (ref 0.0–0.1)
Basophils Relative: 1 %
Eosinophils Absolute: 0.5 K/uL (ref 0.0–0.5)
Eosinophils Relative: 7 %
HCT: 36.8 % — ABNORMAL LOW (ref 39.0–52.0)
Hemoglobin: 12.2 g/dL — ABNORMAL LOW (ref 13.0–17.0)
Immature Granulocytes: 0 %
Lymphocytes Relative: 19 %
Lymphs Abs: 1.4 K/uL (ref 0.7–4.0)
MCH: 30.3 pg (ref 26.0–34.0)
MCHC: 33.2 g/dL (ref 30.0–36.0)
MCV: 91.5 fL (ref 80.0–100.0)
Monocytes Absolute: 0.8 K/uL (ref 0.1–1.0)
Monocytes Relative: 11 %
Neutro Abs: 4.6 K/uL (ref 1.7–7.7)
Neutrophils Relative %: 62 %
Platelet Count: 153 K/uL (ref 150–400)
RBC: 4.02 MIL/uL — ABNORMAL LOW (ref 4.22–5.81)
RDW: 13.2 % (ref 11.5–15.5)
WBC Count: 7.4 K/uL (ref 4.0–10.5)
nRBC: 0 % (ref 0.0–0.2)

## 2023-12-01 LAB — CMP (CANCER CENTER ONLY)
ALT: 31 U/L (ref 0–44)
AST: 32 U/L (ref 15–41)
Albumin: 3.9 g/dL (ref 3.5–5.0)
Alkaline Phosphatase: 76 U/L (ref 38–126)
Anion gap: 5 (ref 5–15)
BUN: 26 mg/dL — ABNORMAL HIGH (ref 8–23)
CO2: 25 mmol/L (ref 22–32)
Calcium: 9.6 mg/dL (ref 8.9–10.3)
Chloride: 109 mmol/L (ref 98–111)
Creatinine: 1.77 mg/dL — ABNORMAL HIGH (ref 0.61–1.24)
GFR, Estimated: 39 mL/min — ABNORMAL LOW (ref >60)
Glucose, Bld: 91 mg/dL (ref 70–99)
Potassium: 3.9 mmol/L (ref 3.5–5.1)
Sodium: 139 mmol/L (ref 135–145)
Total Bilirubin: 0.7 mg/dL (ref 0.0–1.2)
Total Protein: 7.1 g/dL (ref 6.5–8.1)

## 2023-12-01 NOTE — Progress Notes (Signed)
 [facility] [phone]   [phone]  PROGRESS NOTE  Patient Care Team: [place], [name], MD as PCP - General (Family Medicine)  Hematological/Oncological History #DVT/PE: --12/22/2010: DVT in the distal femoral and popliteal veins. Treated with IV herapin then switched to lovenox  therapy as bridge for coumadin . Uncertain length of treatment but was on coumadin  until June 2013.  --10/06/2021: Submassive large clot main pulmonary artery PE status post IR directed catheter thrombolysis. Transitioned to Eliquis  therapy --01/28/2022: establish care with Dr. Federico.  #Leukocytosis: -10/01/2021: WBC 8.52, Hgb 14.4, Plt 139 (L),  -10/16/2021: WBC 16.68 (H), Hgb 10.8( L), MCV 97.4 (H), Plt 265K, ANC 12.4 (H) -11/21/2021: WBC 14.28 (H), Hgb 12.1, Plt 165K, ANC 11.1 (H) -01/28/2022: establish care with Dr. Federico. Leukocytosis resolved.   Interval History:  Douglas Hoffman 78 y.o. male with medical history significant for DVT/PE presents for a follow up visit. The patient's last visit was on 05/26/2023. In the interim since the last visit he has continued on eliquis  and has had no changes in his health.  On exam today Douglas Hoffman reports he has been well overall in interim since her last visit.  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  He reports that he has been well in the last 6 months with no hospitalizations or ER visits.  He reports he is tolerating his Eliquis  well with no major bleeding, bruising, or dark stools.  He reports that he heals quickly if he gets a neck or cut.  He does not have any signs or symptoms concerning for recurrent VTE such as chest pain, shortness of breath, leg swelling, or leg pain.  He reports he tolerates his Eliquis  without difficulty and the cost of the medication is about $100 per month he notes he is lost about 7 pounds of weight since February and he reports he is trying to eat healthier and eat  less fried foods.  Otherwise he denies any fevers, chills, sweats, nausea, vomiting or diarrhea.  Full 10 point ROS is otherwise negative.  MEDICAL HISTORY:  Past Medical History:  Diagnosis Date   Acute venous embolism and thrombosis of unspecified deep vessels of lower extremity    Arthritis    knee    Cancer (HCC)    Cataract    Clotting disorder (HCC)    DVT many years  ago    Glaucoma    left eye since age 58   Gout    Hip pain    History of radiation therapy    Prostate   Hypertension    Prostate cancer [facility])    s/p radiation 36 fx   Renal disorder    reports prior to joint replacement surgery , was taking 5 to 6 aleve daily for pain releif, report shas stopped this and kidney function has omporved, kidney fx followed by his PCP       SURGICAL HISTORY: Past Surgical History:  Procedure Laterality Date   COLONOSCOPY  08/14/2010   normal    IR ANGIOGRAM PULMONARY BILATERAL SELECTIVE  10/06/2021   IR ANGIOGRAM SELECTIVE EACH ADDITIONAL VESSEL  10/06/2021   IR ANGIOGRAM SELECTIVE EACH ADDITIONAL VESSEL  10/06/2021   IR INFUSION THROMBOL ARTERIAL INITIAL (MS)  10/06/2021   IR INFUSION THROMBOL VENOUS INITIAL (MS)  10/06/2021   IR THROMB F/U EVAL ART/VEN FINAL DAY (MS)  10/07/2021   IR US  GUIDE VASC ACCESS RIGHT  10/06/2021   JOINT  REPLACEMENT     RIGHT Hip Replacement   JOINT REPLACEMENT     Knee Replacement   SUPRAPUBIC PROSTATECTOMY     TOTAL KNEE REVISION Left 07/07/2018   Procedure: TOTAL KNEE REVISION LEFT KNEE ARTHROPLASTY WITH FEMORAL AND TIBIAL COMPONENTS;  Surgeon: Fidel Rogue, MD;  Location: [facility];  Service: Orthopedics;  Laterality: Left;    SOCIAL HISTORY: Social History   Socioeconomic History   Marital status: Married    Spouse name: Not on file   Number of children: Not on file   Years of education: Not on file   Highest education level: 12th grade  Occupational History   Not on file  Tobacco Use   Smoking status: Never   Smokeless tobacco: Never   Vaping Use   Vaping status: Never Used  Substance and Sexual Activity   Alcohol  use: No   Drug use: No   Sexual activity: Not on file  Other Topics Concern   Not on file  Social History Narrative   Not on file   Social Drivers of Health   Financial Resource Strain: Low Risk  (08/10/2023)   Overall Financial Resource Strain (CARDIA)    Difficulty of Paying Living Expenses: Not hard at all  Food Insecurity: No Food Insecurity (08/10/2023)   Hunger Vital Sign    Worried About Running Out of Food in the Last Year: Never true    Ran Out of Food in the Last Year: Never true  Transportation Needs: No Transportation Needs (08/10/2023)   PRAPARE - Administrator, Civil Service (Medical): No    Lack of Transportation (Non-Medical): No  Physical Activity: Insufficiently Active (08/10/2023)   Exercise Vital Sign    Days of Exercise per Week: 3 days    Minutes of Exercise per Session: 30 min  Stress: No Stress Concern Present (08/10/2023)   [employer] of Occupational Health - Occupational Stress Questionnaire    Feeling of Stress : Not at all  Social Connections: Unknown (08/10/2023)   Social Connection and Isolation Panel    Frequency of Communication with Friends and Family: More than three times a week    Frequency of Social Gatherings with Friends and Family: Once a week    Attends Religious Services: More than 4 times per year    Active Member of [employer] or Organizations: Not on file    Attends Banker Meetings: Not on file    Marital Status: Married  Catering manager Violence: Not on file    FAMILY HISTORY: Family History  Problem Relation Age of Onset   Colon cancer Neg Hx    Colon polyps Neg Hx    Esophageal cancer Neg Hx    Rectal cancer Neg Hx    Stomach cancer Neg Hx     ALLERGIES:  is allergic to shellfish allergy.  MEDICATIONS:  Current Outpatient Medications  Medication Sig Dispense Refill   acetaminophen  (TYLENOL ) 500 MG tablet Take 1,000  mg by mouth every 6 (six) hours as needed (pain).     amLODipine  (NORVASC ) 5 MG tablet Take 5 mg by mouth every morning.     apixaban  (ELIQUIS ) 5 MG TABS tablet Take 1 tablet (5 mg total) by mouth 2 (two) times daily. 60 tablet 11   atorvastatin  (LIPITOR ) 80 MG tablet Take 1 tablet (80 mg total) by mouth daily. 30 tablet 11   dapagliflozin  propanediol (FARXIGA ) 10 MG TABS tablet Take 1 tablet (10 mg total) by mouth daily before breakfast. 30 tablet  3   ezetimibe  (ZETIA ) 10 MG tablet Take 1 tablet (10 mg total) by mouth daily. 90 tablet 3   triamcinolone  cream (KENALOG ) 0.1 % Apply 1 Application topically 2 (two) times daily as needed. 453.6 g 0   No current facility-administered medications for this visit.    REVIEW OF SYSTEMS:   Constitutional: ( - ) fevers, ( - )  chills , ( - ) night sweats Eyes: ( - ) blurriness of vision, ( - ) double vision, ( - ) watery eyes Ears, nose, mouth, throat, and face: ( - ) mucositis, ( - ) sore throat Respiratory: ( - ) cough, ( - ) dyspnea, ( - ) wheezes Cardiovascular: ( - ) palpitation, ( - ) chest discomfort, ( - ) lower extremity swelling Gastrointestinal:  ( - ) nausea, ( - ) heartburn, ( - ) change in bowel habits Skin: ( - ) abnormal skin rashes Lymphatics: ( - ) new lymphadenopathy, ( - ) easy bruising Neurological: ( - ) numbness, ( - ) tingling, ( - ) new weaknesses Behavioral/Psych: ( - ) mood change, ( - ) new changes  All other systems were reviewed with the patient and are negative.  PHYSICAL EXAMINATION:  Vitals:   12/01/23 0819  BP: 132/83  Pulse: 65  Resp: 13  Temp: 97.8 F (36.6 C)  SpO2: 100%     Filed Weights   12/01/23 0819  Weight: 192 lb 6.4 oz (87.3 kg)      GENERAL: well appearing elderly African-American male alert, no distress and comfortable SKIN: skin color, texture, turgor are normal, no rashes or significant lesions EYES: conjunctiva are pink and non-injected, sclera clear LUNGS: clear to auscultation  and percussion with normal breathing effort HEART: regular rate & rhythm and no murmurs and no lower extremity edema Musculoskeletal: no cyanosis of digits and no clubbing  PSYCH: alert & oriented x 3, fluent speech NEURO: no focal motor/sensory deficits  LABORATORY DATA:  I have reviewed the data as listed    Latest Ref Rng & Units 12/01/2023    7:57 AM 06/03/2023   10:12 AM 11/25/2022    7:34 AM  CBC  WBC 4.0 - 10.5 K/uL 7.4  7.8  7.2   Hemoglobin 13.0 - 17.0 g/dL 87.7  86.4  87.3   Hematocrit 39.0 - 52.0 % 36.8  41.5  37.6   Platelets 150 - 400 K/uL 153  193  163        Latest Ref Rng & Units 12/01/2023    7:57 AM 11/10/2023    8:32 AM 06/03/2023   10:12 AM  CMP  Glucose 70 - 99 mg/dL 91  894  898   BUN 8 - 23 mg/dL 26  26  26    Creatinine 0.61 - 1.24 mg/dL 8.22  8.25  8.42   Sodium 135 - 145 mmol/L 139  139  140   Potassium 3.5 - 5.1 mmol/L 3.9  4.2  4.3   Chloride 98 - 111 mmol/L 109  106  109   CO2 22 - 32 mmol/L 25  25  26    Calcium  8.9 - 10.3 mg/dL 9.6  89.6  9.8   Total Protein 6.5 - 8.1 g/dL 7.1   7.4   Total Bilirubin 0.0 - 1.2 mg/dL 0.7   0.5   Alkaline Phos 38 - 126 U/L 76   90   AST 15 - 41 U/L 32   26   ALT 0 - 44 U/L 31  23    RADIOGRAPHIC STUDIES: No results found.  ASSESSMENT & PLAN Douglas Hoffman 79 y.o. male with medical history significant for DVT/PE presents for a follow up visit.   Discussed possible provoking factors can cause venous thromboembolisms including prolonged travel/immobility, surgery (particular abdominal or orthropedic), trauma,  and pregnancy/ estrogen containing birth control. After a detailed history and review of the records there is no clear provoking factor for this patient's recent massive pulmonary embolism.  Patients with unprovoked VTEs have up to 25% recurrence after 5 years and 36% at 10 years, with 4% of these clots being fatal ([id number]). Therefore the formal recommendation for unprovoked VTE's is lifelong  anticoagulation, as the cause may not be transient or reversible. We recommend 6 months or full strength anticoagulation with a re-evaluation after that time.  The patient's will then have a choice of maintenance dose DOAC (preferred, recommended), 81mg  ASA PO daily (non-preferred), or no further anticoagulation (not recommended).    #Recurrent DVT/PE. --First episode in 2013 treated with warfarin, duration unknown --Second episode in June 2023, now on Eliqius. Felt to be unprovoked. --will order CMP and CBC at each visit to assure labs are adequate for DOAC therapy --Today show white blood cell count 7.4, hemoglobin 12.2, MCV 91.5, platelets 153 --recommend the patient continue eliquis  5mg  BID.  Offered transition to maintenance dosing but patient would like to continue on full-strength. --patient denies any bleeding, bruising, or dark stools on this medication. It is well tolerated. No difficulties accessing/affording the medication --RTC in 6 months' time with strict return precautions for overt signs of bleeding.    #Chronic Kidney Disease -- Patient follows with Dr. Watt in urology but does not have a nephrologist.  Creatinine on 11/10/2023 was 1.74. -- Labs today show creatinine 1.77    # Leukocytosis, resolved: -- Suspect inflammatory process secondary to hospitalization for submassive PE in June 2023. -- resolved, continue to monitor   No orders of the defined types were placed in this encounter.   All questions were answered. The patient knows to call the clinic with any problems, questions or concerns.  A total of more than 25 minutes were spent on this encounter with face-to-face time and non-face-to-face time, including preparing to see the patient, ordering tests and/or medications, counseling the patient and coordination of care as outlined above.   Norleen IVAR Kidney, MD Department of Hematology/Oncology [facility] Phone:  [phone] Pager: [phone] Email: [email or web address]  12/07/2023 10:20 AM

## 2023-12-14 ENCOUNTER — Ambulatory Visit: Admitting: Skilled Nursing Facility1

## 2023-12-15 ENCOUNTER — Telehealth: Payer: Self-pay | Admitting: Family Medicine

## 2023-12-15 NOTE — Telephone Encounter (Unsigned)
 Copied from CRM [id number]. Topic: Clinical - Medication Question >> Dec 15, 2023  9:36 AM Douglas Hoffman wrote: Reason for CRM: last medication that was given he doesnt remember the name but he says he thinks its giving him a issue with using the restroom.  callback # [phone]

## 2023-12-16 ENCOUNTER — Ambulatory Visit: Payer: Self-pay | Admitting: Family Medicine

## 2023-12-16 NOTE — Telephone Encounter (Signed)
 FYI Only or Action Required?: FYI only for provider.  Patient was last seen in primary care on 11/10/2023 by [place], [name], MD.  Called Nurse Triage reporting Constipation.  Symptoms began several days ago.  Symptoms are: unchanged.  Triage Disposition: See Physician Within 24 Hours  Patient/caregiver understands and will follow disposition?: Yes             Copied from CRM #[id number]. Topic: Clinical - Red Word Triage >> Dec 16, 2023  9:19 AM Suzen RAMAN wrote: Red Word that prompted transfer to Nurse Triage: inability to pass stool since Sunday 12/12/23 Reason for Disposition  Last bowel movement (BM) > 4 days ago  Answer Assessment - Initial Assessment Questions STOOL PATTERN OR FREQUENCY: How often do you have a bowel movement (BM)?  (Normal range: 3 times a day to every 3 days)  When was your last BM?        Normally every day STRAINING: Do you have to strain to have a BM?      No  ONSET: When did the constipation begin?     12/12/2023; pt states he is passing gas  CHRONIC CONSTIPATION: Is this a new problem for you?  If No, ask: How long have you had this problem? (days, weeks, months)      Yes, off and on  CHANGES IN DIET OR HYDRATION: Have there been any recent changes in your diet? How much fluids are you drinking on a daily basis?  How much have you had to drink today?     Staying hydrated; pt states he is not eating as much and trying to eat healthier  MEDICINES: Have you been taking any new medicines? Are you taking any narcotic pain medicines? (e.g., Dilaudid , morphine , Percocet, Vicodin)     Yes new medication started 2 weeks ago  LAXATIVES: Have you been using any stool softeners, laxatives, or enemas?  If Yes, ask What are you using, how often, and when was the last time?       No  Protocols used: Constipation-A-AH

## 2023-12-17 ENCOUNTER — Encounter: Payer: Self-pay | Admitting: Family Medicine

## 2023-12-17 ENCOUNTER — Ambulatory Visit (INDEPENDENT_AMBULATORY_CARE_PROVIDER_SITE_OTHER): Admitting: Family Medicine

## 2023-12-17 VITALS — BP 122/80 | HR 75 | Temp 97.5°F | Ht 73.0 in | Wt 190.0 lb

## 2023-12-17 DIAGNOSIS — K5900 Constipation, unspecified: Secondary | ICD-10-CM

## 2023-12-17 MED ORDER — SENNOSIDES-DOCUSATE SODIUM 8.6-50 MG PO TABS: 1.0000 | ORAL_TABLET | Freq: Every day | ORAL | 0 refills | Status: AC

## 2023-12-17 NOTE — Progress Notes (Signed)
 Established Patient Office Visit   Subjective  Patient ID: Douglas Hoffman, male    DOB: 03-06-1946  Age: 78 y.o. MRN: [id number]  Chief Complaint  Patient presents with   Medical Management of Chronic Issues    Constipation, started Sunday, gas, no stomach pain     Pt is a 78 yo male followed by Dr. [place] and seen for acute concern.  Pt with constipation x 4 days.  Last BM on Sunday, small pebbles.  Endorses flatus.  Denies chronic constipation, straining, n/v, abd pain, or recent changes in meds. Not on narcotics. Tried prune juice and miralax  x 1.  Working on improving diet.  No longer having fried foods.  May only eat once per day.,  Just does not have an appetite in the evening.  Drinking 5-6 large glasses of water  daily.    Patient Active Problem List   Diagnosis Date Noted   Routine general medical examination at a health care facility 11/10/2023   Type 2 diabetes mellitus with stage 3b chronic kidney disease, without long-term current use of insulin  (HCC) 11/10/2023   Hyperlipidemia 04/19/2023   Hematoma 10/08/2021   Demand ischemia (HCC)    CAD (coronary artery disease), native coronary artery    AKI (acute kidney injury) (HCC)    Prediabetes    PE (pulmonary thromboembolism) (HCC)    Failed total knee, left (HCC) 07/07/2018   Failed total knee, left, initial encounter (HCC) 07/07/2018   Hypertrophy of prostate with urinary obstruction and other lower urinary tract symptoms (LUTS) 03/23/2012   Chronic Cystitis 03/23/2012   Decreased libido 03/23/2012   Impotence of organic origin 03/23/2012   Malignant neoplasm of prostate (HCC) 03/23/2012   Elevated prostate specific antigen (PSA) 03/23/2012   Gout attack 10/30/2011   CKD (chronic kidney disease), stage III (HCC) 10/30/2011   HTN (hypertension) 10/30/2011   DVT (deep venous thrombosis) (HCC) 10/30/2011   Past Medical History:  Diagnosis Date   Acute venous embolism and thrombosis of unspecified deep vessels of lower  extremity    Arthritis    knee    Cancer (HCC)    Cataract    Clotting disorder (HCC)    DVT many years  ago    Glaucoma    left eye since age 84   Gout    Hip pain    History of radiation therapy    Prostate   Hypertension    Prostate cancer [facility])    s/p radiation 36 fx   Renal disorder    reports prior to joint replacement surgery , was taking 5 to 6 aleve daily for pain releif, report shas stopped this and kidney function has omporved, kidney fx followed by his PCP      Past Surgical History:  Procedure Laterality Date   COLONOSCOPY  08/14/2010   normal    IR ANGIOGRAM PULMONARY BILATERAL SELECTIVE  10/06/2021   IR ANGIOGRAM SELECTIVE EACH ADDITIONAL VESSEL  10/06/2021   IR ANGIOGRAM SELECTIVE EACH ADDITIONAL VESSEL  10/06/2021   IR INFUSION THROMBOL ARTERIAL INITIAL (MS)  10/06/2021   IR INFUSION THROMBOL VENOUS INITIAL (MS)  10/06/2021   IR THROMB F/U EVAL ART/VEN FINAL DAY (MS)  10/07/2021   IR US  GUIDE VASC ACCESS RIGHT  10/06/2021   JOINT REPLACEMENT     RIGHT Hip Replacement   JOINT REPLACEMENT     Knee Replacement   SUPRAPUBIC PROSTATECTOMY     TOTAL KNEE REVISION Left 07/07/2018   Procedure: TOTAL KNEE REVISION LEFT KNEE  ARTHROPLASTY WITH FEMORAL AND TIBIAL COMPONENTS;  Surgeon: Fidel Rogue, MD;  Location: [facility];  Service: Orthopedics;  Laterality: Left;   Social History   Tobacco Use   Smoking status: Never   Smokeless tobacco: Never  Vaping Use   Vaping status: Never Used  Substance Use Topics   Alcohol  use: No   Drug use: No   Family History  Problem Relation Age of Onset   Colon cancer Neg Hx    Colon polyps Neg Hx    Esophageal cancer Neg Hx    Rectal cancer Neg Hx    Stomach cancer Neg Hx    Allergies  Allergen Reactions   Shellfish Allergy Swelling    Gout     ROS Negative unless stated above    Objective:     BP 122/80 (BP Location: Left Arm, Patient Position: Sitting, Cuff Size: Normal)   Pulse 75   Temp (!) 97.5 F (36.4 C) (Oral)    Ht 6' 1 (1.854 m)   Wt 190 lb (86.2 kg)   SpO2 98%   BMI 25.07 kg/m  BP Readings from Last 3 Encounters:  12/17/23 122/80  12/01/23 132/83  11/10/23 136/80   Wt Readings from Last 3 Encounters:  12/17/23 190 lb (86.2 kg)  12/01/23 192 lb 6.4 oz (87.3 kg)  11/10/23 197 lb (89.4 kg)      Physical Exam Constitutional:      Appearance: Normal appearance.  HENT:     Head: Normocephalic and atraumatic.     Nose: Nose normal.     Mouth/Throat:     Mouth: Mucous membranes are moist.  Cardiovascular:     Rate and Rhythm: Normal rate.  Pulmonary:     Effort: Pulmonary effort is normal.  Abdominal:     General: Bowel sounds are normal. There is no distension.     Palpations: Abdomen is soft.     Tenderness: There is no abdominal tenderness. There is no right CVA tenderness, left CVA tenderness or guarding.  Skin:    General: Skin is warm and dry.  Neurological:     Mental Status: He is alert and oriented to person, place, and time.     No results found for any visits on 12/17/23.    Assessment & Plan:   Constipation, unspecified constipation type -     Sennosides-Docusate Sodium ; Take 1 tablet by mouth daily.  Dispense: 30 tablet; Refill: 0  Constipation likely from diet.  Discussed trying to eat more consistently throughout the day.  Increase intake of vegetables.  Colace-senna.  Ok to use Miralax  daily if needed.  Given handout.    Return if symptoms worsen or fail to improve.   Clotilda JONELLE Single, MD

## 2023-12-27 NOTE — Progress Notes (Signed)
 Diabetes Self-Management Education  Visit Type: First/Initial  Appt. Start Time: 0800 Appt. End Time: 0840  01/07/2024  [name], identified by name and date of birth, is a 78 y.o. male with a diagnosis of Diabetes: Type 2.   ASSESSMENT  Patient is here today alone. Patient would like to learn more about diabetes. Pt c/o cost of his current diabetes medications. Pt reports he lives with his wife and she does the cooking and shopping. 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  Pt reports making the following changes recently including decreasing intake of fried foods, decreasing soda intake to once weekly and adding less sugar in his morning coffee. Pt reports daily intake of juice. Pt reports he follows a NAS meal pattern. Pt reports he does not have a regular meal schedule and would like to improve. Pt reports eating out once weekly.  All Pt's questions were answered during this encounter.   History includes:   Past Medical History:  Diagnosis Date   Acute venous embolism and thrombosis of unspecified deep vessels of lower extremity    Arthritis    knee    Cancer (HCC)    Cataract    Clotting disorder (HCC)    DVT many years  ago    Glaucoma    left eye since age 43   Gout    Hip pain    History of radiation therapy    Prostate   Hypertension    Prostate cancer [facility])    s/p radiation 36 fx   Renal disorder    reports prior to joint replacement surgery , was taking 5 to 6 aleve daily for pain releif, report shas stopped this and kidney function has omporved, kidney fx followed by his PCP       Medications include:   Current Outpatient Medications:    acetaminophen  (TYLENOL ) 500 MG tablet, Take 1,000 mg by mouth every 6 (six) hours as needed (pain)., Disp: , Rfl:    amLODipine  (NORVASC ) 5 MG tablet, Take 5 mg by mouth every morning., Disp: , Rfl:    apixaban  (ELIQUIS ) 5 MG TABS tablet, Take 1 tablet (5 mg total) by mouth 2  (two) times daily., Disp: 60 tablet, Rfl: 11   atorvastatin  (LIPITOR ) 80 MG tablet, Take 1 tablet (80 mg total) by mouth daily., Disp: 30 tablet, Rfl: 11   dapagliflozin  propanediol (FARXIGA ) 10 MG TABS tablet, Take 1 tablet (10 mg total) by mouth daily before breakfast., Disp: 30 tablet, Rfl: 3   ezetimibe  (ZETIA ) 10 MG tablet, Take 1 tablet (10 mg total) by mouth daily., Disp: 90 tablet, Rfl: 3   senna-docusate (SENOKOT-S) 8.6-50 MG tablet, Take 1 tablet by mouth daily., Disp: 30 tablet, Rfl: 0   triamcinolone  cream (KENALOG ) 0.1 %, Apply 1 Application topically 2 (two) times daily as needed., Disp: 453.6 g, Rfl: 0   Labs noted:   Lab Results  Component Value Date   HGBA1C 6.6 (H) 11/10/2023   Lab Results  Component Value Date   CHOL 113 11/10/2023   HDL 40.30 11/10/2023   LDLCALC 57 11/10/2023   TRIG 81.0 11/10/2023   CHOLHDL 3 11/10/2023   BP Readings from Last 3 Encounters:  12/17/23 122/80  12/01/23 132/83  11/10/23 136/80   Lab Results  Component Value Date   NA 139 12/01/2023   CL 109 12/01/2023   K 3.9 12/01/2023   CO2 25 12/01/2023   BUN 26 (  H) 12/01/2023   CREATININE 1.77 (H) 12/01/2023   GFRNONAA 39 (L) 12/01/2023   CALCIUM  9.6 12/01/2023   PHOS 2.1 (L) 10/07/2021   ALBUMIN  3.9 12/01/2023   GLUCOSE 91 12/01/2023   BP Readings from Last 3 Encounters:  12/17/23 122/80  12/01/23 132/83  11/10/23 136/80   Wt Readings from Last 3 Encounters:  12/17/23 190 lb (86.2 kg)  12/01/23 192 lb 6.4 oz (87.3 kg)  11/10/23 197 lb (89.4 kg)   There were no vitals taken for this visit. There is no height or weight on file to calculate BMI.   Diabetes Self-Management Education - 01/07/24 0806       Visit Information   Visit Type First/Initial      Initial Visit   Diabetes Type Type 2    Date Diagnosed 2025    Are you currently following a meal plan? No    Are you taking your medications as prescribed? Yes      Health Coping   How would you rate your overall  health? Good      Psychosocial Assessment   Patient Belief/Attitude about Diabetes Motivated to manage diabetes    What is the hardest part about your diabetes right now, causing you the most concern, or is the most worrisome to you about your diabetes?   Taking/obtaining medications;Making healty food and beverage choices;Getting support / problem solving    Self-care barriers None    Self-management support Doctor's office    Other persons present Patient    Patient Concerns Nutrition/Meal planning    Special Needs None    Learning Readiness Change in progress    How often do you need to have someone help you when you read instructions, pamphlets, or other written materials from your doctor or pharmacy? 1 - Never    What is the last grade level you completed in school? some college      Pre-Education Assessment   Patient understands the diabetes disease and treatment process. Needs Instruction    Patient understands incorporating nutritional management into lifestyle. Needs Instruction    Patient undertands incorporating physical activity into lifestyle. Needs Instruction    Patient understands using medications safely. Needs Instruction    Patient understands monitoring blood glucose, interpreting and using results Needs Instruction    Patient understands prevention, detection, and treatment of acute complications. Needs Instruction    Patient understands prevention, detection, and treatment of chronic complications. Needs Instruction    Patient understands how to develop strategies to address psychosocial issues. Needs Instruction    Patient understands how to develop strategies to promote health/change behavior. Needs Instruction      Complications   Last HgB A1C per patient/outside source 6.6 %    How often do you check your blood sugar? 0 times/day (not testing)    Have you had a dilated eye exam in the past 12 months? No    Have you had a dental exam in the past 12 months? No       Dietary Intake   Breakfast coffee with 1 Tbsp and milk, mcdonalds oat meal or banana or egg and cheese on english muffin    Lunch skips or hambuger, potato salad, baked beans, water     Snack (afternoon) nuts    Dinner [place] [employer] or skips or 2 slices of wheat bread, tomato, mayo    Beverage(s) water , coffee with 1 Tbsp and milk      Activity / Exercise   Activity / Exercise  Type Light (walking / raking leaves)    How many days per week do you exercise? 5      Patient Education   Previous Diabetes Education No    Disease Pathophysiology Definition of diabetes, type 1 and 2, and the diagnosis of diabetes    Healthy Eating Role of diet in the treatment of diabetes and the relationship between the three main macronutrients and blood glucose level    Being Active Role of exercise on diabetes management, blood pressure control and cardiac health.    Medications Reviewed patients medication for diabetes, action, purpose, timing of dose and side effects.    Monitoring Interpreting lab values - A1C, lipid, urine microalbumina.    Acute complications Discussed and identified patients' prevention, symptoms, and treatment of hyperglycemia.    Chronic complications Relationship between chronic complications and blood glucose control;Retinopathy and reason for yearly dilated eye exams;Dental care    Diabetes Stress and Support Helped patient identify a support system for diabetes management;Identified and addressed patients feelings and concerns about diabetes    Lifestyle and Health Coping Lifestyle issues that need to be addressed for better diabetes care      Individualized Goals (developed by patient)   Nutrition Follow meal plan discussed    Physical Activity Exercise 5-7 days per week;30 minutes per day    Medications take my medication as prescribed    Monitoring  Not Applicable    Problem Solving Eating Pattern    Reducing Risk do foot checks daily    Health Coping Ask for help with  psychological, social, or emotional issues      Post-Education Assessment   Patient understands the diabetes disease and treatment process. Needs Review    Patient understands incorporating nutritional management into lifestyle. Needs Review    Patient undertands incorporating physical activity into lifestyle. Needs Review    Patient understands using medications safely. Comphrehends key points    Patient understands monitoring blood glucose, interpreting and using results Needs Instruction    Patient understands prevention, detection, and treatment of acute complications. Needs Review    Patient understands prevention, detection, and treatment of chronic complications. Needs Review    Patient understands how to develop strategies to address psychosocial issues. Needs Review    Patient understands how to develop strategies to promote health/change behavior. Needs Review      Outcomes   Expected Outcomes Demonstrated interest in learning. Expect positive outcomes    Future DMSE 3-4 months    Program Status Not Completed          Individualized Plan for Diabetes Self-Management Training:   Learning Objective:  Patient will have a greater understanding of diabetes self-management. Patient education plan is to attend individual and/or group sessions per assessed needs and concerns.   Plan:   Patient Instructions  Aim for an improved meal schedule and avoid skipping meals  Expected Outcomes:  Demonstrated interest in learning. Expect positive outcomes  Education material provided: ADA - How to Thrive: A Guide for Your Journey with Diabetes, My Plate, and Snack sheet  If problems or questions, patient to contact team via:  Phone  Future DSME appointment: 3-4 months  No questionnaires on file.

## 2024-01-05 DIAGNOSIS — Z8546 Personal history of malignant neoplasm of prostate: Secondary | ICD-10-CM | POA: Diagnosis not present

## 2024-01-05 DIAGNOSIS — N3040 Irradiation cystitis without hematuria: Secondary | ICD-10-CM | POA: Diagnosis not present

## 2024-01-05 DIAGNOSIS — N3941 Urge incontinence: Secondary | ICD-10-CM | POA: Diagnosis not present

## 2024-01-07 ENCOUNTER — Encounter: Attending: Family Medicine | Admitting: Dietician

## 2024-01-07 DIAGNOSIS — E1122 Type 2 diabetes mellitus with diabetic chronic kidney disease: Secondary | ICD-10-CM | POA: Diagnosis not present

## 2024-01-07 DIAGNOSIS — N1831 Chronic kidney disease, stage 3a: Secondary | ICD-10-CM | POA: Diagnosis not present

## 2024-01-07 NOTE — Patient Instructions (Addendum)
 Aim for an improved meal schedule and avoid skipping meals using the plate planner

## 2024-01-26 ENCOUNTER — Encounter: Payer: Self-pay | Admitting: Family Medicine

## 2024-01-26 ENCOUNTER — Ambulatory Visit (INDEPENDENT_AMBULATORY_CARE_PROVIDER_SITE_OTHER): Admitting: Family Medicine

## 2024-01-26 VITALS — BP 110/74 | HR 64 | Temp 97.7°F | Resp 16 | Ht 73.0 in | Wt 186.6 lb

## 2024-01-26 DIAGNOSIS — N1832 Chronic kidney disease, stage 3b: Secondary | ICD-10-CM | POA: Diagnosis not present

## 2024-01-26 DIAGNOSIS — Z0000 Encounter for general adult medical examination without abnormal findings: Secondary | ICD-10-CM

## 2024-01-26 DIAGNOSIS — E1122 Type 2 diabetes mellitus with diabetic chronic kidney disease: Secondary | ICD-10-CM

## 2024-01-26 NOTE — Progress Notes (Signed)
 Chief Complaint  Patient presents with   Medicare Wellness   Follow-up   Discussed the use of AI scribe software for clinical note transcription with the patient, who gave verbal consent to proceed.  History of Present Illness Douglas Hoffman is a 78 year old male with newly diagnosed type 2 diabetes, CKD III, HTN ,DVT on chronic anticoagulation, CAD,  prostate cancer s/p prostatectomy, and gout, who presents for medicare wellness and follow-up visit.  He was last seen on 11/10/23.  He is independent in his activities of daily living, He exercises regularly by walking and believes he eats healthfully. No falls and does not require assistance from others.  He is currently looking for an eye care provider and has not seen an eye care provider recently.  He sees oncologist, Dr Federico, urologist  (Ubaldo Eagles NP), pulmonologist ( Dr.Hunsucker, [place]) regularly.  He has not yet completed a living will or power of attorney.  Functional Status Survey: Is the patient deaf or have difficulty hearing?: No Does the patient have difficulty seeing, even when wearing glasses/contacts?: No Does the patient have difficulty concentrating, remembering, or making decisions?: No Does the patient have difficulty walking or climbing stairs?: No Does the patient have difficulty dressing or bathing?: No Does the patient have difficulty doing errands alone such as visiting a doctor's office or shopping?: No     01/26/2024    8:57 AM 01/07/2024    8:05 AM 08/10/2023   10:01 PM 04/19/2023    9:28 AM  Fall Risk   Falls in the past year? 0  0 0  Number falls in past yr: 0 0 0 0  Injury with Fall? 0 0 0 0  Risk for fall due to : No Fall Risks   Other (Comment)  Follow up Falls evaluation completed   Falls evaluation completed      01/26/2024    8:56 AM  Depression screen PHQ 2/9  Decreased Interest 0  Down, Depressed, Hopeless 0  PHQ - 2 Score 0  Altered sleeping 0  Tired, decreased energy 0   Change in appetite 0  Feeling bad or failure about yourself  0  Trouble concentrating 0  Moving slowly or fidgety/restless 0  Suicidal thoughts 0  PHQ-9 Score 0  Difficult doing work/chores Not difficult at all    Mini-Cog - 01/26/24 0917     Normal clock drawing test? yes    How many words correct? 3         Vision Screening   Right eye Left eye Both eyes  Without correction 20/200 20/200 20/50  With correction      Hypertension and CKD III:  He is on Amlodipine  5 mg daily. Negative for unusual or severe headache, visual changes, exertional chest pain, dyspnea,  focal weakness, or edema.  Lab Results  Component Value Date   CREATININE 1.77 (H) 12/01/2023   BUN 26 (H) 12/01/2023   NA 139 12/01/2023   K 3.9 12/01/2023   CL 109 12/01/2023   CO2 25 12/01/2023   Diabetes Mellitus II: Dx'ed in 11/2023 with HgA1C 6,6. + Micro proteinuria. He started Farxiga  10 mg daily. He attended a session with a nutritionist two weeks ago and has made dietary changes. Negative for symptoms of hypoglycemia, polyuria, polydipsia, numbness extremities, foot ulcers/trauma  Lab Results  Component Value Date   HGBA1C 6.6 (H) 11/10/2023   Lab Results  Component Value Date   MICROALBUR 4.4 (H) 11/10/2023   Hyperlipidemia:He is  on Atorvastatin  80 mg daily and Zetia  10 mg daily. Lab Results  Component Value Date   CHOL 113 11/10/2023   HDL 40.30 11/10/2023   LDLCALC 57 11/10/2023   TRIG 81.0 11/10/2023   CHOLHDL 3 11/10/2023   Review of Systems  Constitutional:  Negative for activity change, appetite change, chills and fever.  Respiratory:  Negative for cough and wheezing.   Gastrointestinal:  Negative for abdominal pain, nausea and vomiting.  Genitourinary:  Negative for decreased urine volume, dysuria and hematuria.  Skin:  Negative for rash.  Neurological:  Negative for syncope and facial asymmetry.  Psychiatric/Behavioral:  Negative for confusion. The patient is not  nervous/anxious.   See other pertinent positives and negatives in HPI.  Current Outpatient Medications on File Prior to Visit  Medication Sig Dispense Refill   acetaminophen  (TYLENOL ) 500 MG tablet Take 1,000 mg by mouth every 6 (six) hours as needed (pain).     amLODipine  (NORVASC ) 5 MG tablet Take 5 mg by mouth every morning.     apixaban  (ELIQUIS ) 5 MG TABS tablet Take 1 tablet (5 mg total) by mouth 2 (two) times daily. 60 tablet 11   atorvastatin  (LIPITOR ) 80 MG tablet Take 1 tablet (80 mg total) by mouth daily. 30 tablet 11   dapagliflozin  propanediol (FARXIGA ) 10 MG TABS tablet Take 1 tablet (10 mg total) by mouth daily before breakfast. 30 tablet 3   ezetimibe  (ZETIA ) 10 MG tablet Take 1 tablet (10 mg total) by mouth daily. 90 tablet 3   senna-docusate (SENOKOT-S) 8.6-50 MG tablet Take 1 tablet by mouth daily. 30 tablet 0   triamcinolone  cream (KENALOG ) 0.1 % Apply 1 Application topically 2 (two) times daily as needed. 453.6 g 0   No current facility-administered medications on file prior to visit.   Past Medical History:  Diagnosis Date   Acute venous embolism and thrombosis of unspecified deep vessels of lower extremity    Arthritis    knee    Cancer (HCC)    Cataract    Clotting disorder    DVT many years  ago    Glaucoma    left eye since age 35   Gout    Hip pain    History of radiation therapy    Prostate   Hypertension    Prostate cancer [facility])    s/p radiation 36 fx   Renal disorder    reports prior to joint replacement surgery , was taking 5 to 6 aleve daily for pain releif, report shas stopped this and kidney function has omporved, kidney fx followed by his PCP       Allergies  Allergen Reactions   Shellfish Allergy Swelling    Gout     Social History   Socioeconomic History   Marital status: Married    Spouse name: Not on file   Number of children: Not on file   Years of education: Not on file   Highest education level: 12th grade  Occupational  History   Not on file  Tobacco Use   Smoking status: Never   Smokeless tobacco: Never  Vaping Use   Vaping status: Never Used  Substance and Sexual Activity   Alcohol  use: No   Drug use: No   Sexual activity: Not on file  Other Topics Concern   Not on file  Social History Narrative   Not on file   Social Drivers of Health   Financial Resource Strain: Low Risk  (01/26/2024)   Overall Financial  Resource Strain (CARDIA)    Difficulty of Paying Living Expenses: Not hard at all  Food Insecurity: No Food Insecurity (01/26/2024)   Hunger Vital Sign    Worried About Running Out of Food in the Last Year: Never true    Ran Out of Food in the Last Year: Never true  Transportation Needs: No Transportation Needs (01/26/2024)   PRAPARE - Administrator, Civil Service (Medical): No    Lack of Transportation (Non-Medical): No  Physical Activity: Insufficiently Active (01/26/2024)   Exercise Vital Sign    Days of Exercise per Week: 3 days    Minutes of Exercise per Session: 30 min  Stress: No Stress Concern Present (01/26/2024)   [employer] of Occupational Health - Occupational Stress Questionnaire    Feeling of Stress: Not at all  Social Connections: Unknown (01/26/2024)   Social Connection and Isolation Panel    Frequency of Communication with Friends and Family: More than three times a week    Frequency of Social Gatherings with Friends and Family: Once a week    Attends Religious Services: More than 4 times per year    Active Member of [employer] or Organizations: Not on file    Attends Banker Meetings: Not on file    Marital Status: Married   Today's Vitals   01/26/24 0900  BP: 110/74  Pulse: 64  Resp: 16  Temp: 97.7 F (36.5 C)  TempSrc: Oral  SpO2: 97%  Weight: 186 lb 9.6 oz (84.6 kg)  Height: 6' 1 (1.854 m)   Body mass index is 24.62 kg/m.  Physical Exam Vitals and nursing note reviewed.  Constitutional:      General: He is not in acute  distress.    Appearance: He is well-developed.  HENT:     Head: Normocephalic and atraumatic.     Mouth/Throat:     Mouth: Mucous membranes are moist.  Eyes:     Pupils:     Left eye: Pupil is not reactive.  Cardiovascular:     Rate and Rhythm: Normal rate and regular rhythm.     Pulses:          Dorsalis pedis pulses are 2+ on the right side and 2+ on the left side.     Heart sounds: No murmur heard. Pulmonary:     Effort: Pulmonary effort is normal. No respiratory distress.     Breath sounds: Normal breath sounds.  Abdominal:     Palpations: Abdomen is soft. There is no hepatomegaly or mass.     Tenderness: There is no abdominal tenderness.  Skin:    General: Skin is warm.     Findings: No erythema or rash.  Neurological:     Mental Status: He is alert and oriented to person, place, and time.     Cranial Nerves: No cranial nerve deficit.     Gait: Gait normal.  Psychiatric:        Mood and Affect: Mood and affect normal.    ASSESSMENT AND PLAN:  Douglas Hoffman was seen today for medicare wellness and follow-up.  Diagnoses and all orders for this visit:  Medicare annual wellness visit, subsequent We discussed the importance of staying active, physically and mentally, as well as the benefits of a healthy/balance diet. Low impact exercise that involve stretching and strengthing are ideal. Vaccines up to date. We discussed preventive screening for the next 5-10 years, summery of recommendations given in AVS. Fall prevention. Advance directives and  end of life discussed, recommended, package given.  Type 2 diabetes mellitus with stage 3b chronic kidney disease, without long-term current use of insulin  (HCC) Dx'ed 11/2023 with HgA1C 6.6. We discussed Dx and possible complications. He already met with diabetes educator and has made dietary changes. Continue Farxiga  10 mg daily. Pending eye exam. Continue appropriate foot/skin care. Keep f/u appt.   Return for keep  next appointment.  Douglas Piazza [place], MD [facility]. [place] office.

## 2024-01-26 NOTE — Patient Instructions (Addendum)
  [name] , Thank you for taking time to come for your Medicare Wellness Visit. I appreciate your ongoing commitment to your health goals. Please review the following plan we discussed and let me know if I can assist you in the future.   These are the goals we discussed:  Goals       Acknowledge receipt of Advanced Directive package        This is a list of the screening recommended for you and due dates:  Health Maintenance  Topic Date Due   Eye exam for diabetics  Never done   COVID-19 Vaccine (9 - Pfizer risk 2024-25 season) 01/03/2024   Hemoglobin A1C  05/12/2024   Yearly kidney health urinalysis for diabetes  11/09/2024   Yearly kidney function blood test for diabetes  11/30/2024   Complete foot exam   01/25/2025   Medicare Annual Wellness Visit  01/25/2025   DTaP/Tdap/Td vaccine (3 - Td or Tdap) 03/15/2033   Pneumococcal Vaccine for age over 91  Completed   Flu Shot  Completed   Hepatitis C Screening  Completed   Zoster (Shingles) Vaccine  Completed   HPV Vaccine  Aged Out   Meningitis B Vaccine  Aged Out   Colon Cancer Screening  Discontinued   A few things to remember from today's visit:  Medicare annual wellness visit, subsequent  Type 2 diabetes mellitus with stage 3b chronic kidney disease, without long-term current use of insulin  (HCC), Chronic You need app with eye care provider.  If you need refills for medications you take chronically, please call your pharmacy. Do not use My Chart to request refills or for acute issues that need immediate attention. If you send a my chart message, it may take a few days to be addressed, specially if I am not in the office.  Please be sure medication list is accurate. If a new problem present, please set up appointment sooner than planned today.

## 2024-01-26 NOTE — Assessment & Plan Note (Signed)
 Dx'ed 11/2023 with HgA1C 6.6. We discussed Dx and possible complications. He already met with diabetes educator and has made dietary changes. Continue Farxiga  10 mg daily. Pending eye exam. Continue appropriate foot/skin care. Keep f/u appt.

## 2024-01-31 ENCOUNTER — Other Ambulatory Visit (INDEPENDENT_AMBULATORY_CARE_PROVIDER_SITE_OTHER)

## 2024-01-31 ENCOUNTER — Telehealth: Payer: Self-pay

## 2024-01-31 ENCOUNTER — Other Ambulatory Visit

## 2024-01-31 DIAGNOSIS — E1122 Type 2 diabetes mellitus with diabetic chronic kidney disease: Secondary | ICD-10-CM

## 2024-01-31 DIAGNOSIS — N1832 Chronic kidney disease, stage 3b: Secondary | ICD-10-CM

## 2024-01-31 NOTE — Progress Notes (Signed)
   01/31/2024  Patient ID: Douglas Hoffman, male   DOB: 11-12-1945, 78 y.o.   MRN: [id number]  Pharmacy Quality Measure Review  This patient is appearing on a report for being at risk of failing the adherence measure for cholesterol (statin) and diabetes medications this calendar year.   Medication: Farxiga  Last fill date: 12/20/23 for 30 day supply  Medication: Atorvastatin  Last fill date: 09/14/23 for 90 day supply  Discussed barriers to adherence, which included cost of Farxiga  and not feeling like atorvastatin  was needed daily. Discussed importance of adherence to meds and taking daily as prescribed. Patient should qualify for AZ&Me PAP for Farxiga . Patient will come by office to sign paperwork this week. Still has atorvastatin  on hand, reports he will take daily and refill when he gets lower.  [name], PharmD Clinical Pharmacist [phone]

## 2024-01-31 NOTE — Progress Notes (Signed)
   01/31/2024  Patient ID: Douglas Hoffman, male   DOB: 1945-06-28, 78 y.o.   MRN: [id number]  Attempted to contact patient for scheduled appointment for medication management. Left HIPAA compliant message for patient to return my call at their convenience.   [name], PharmD Clinical Pharmacist [phone]

## 2024-02-02 NOTE — Progress Notes (Signed)
   02/02/2024  Patient ID: Douglas Hoffman, male   DOB: July 17, 1945, 77 y.o.   MRN: [id number]  Patient returned his portion of the AZ&Me PAP application for Farxiga .  Submitted that, along with provider portion, via fax. Pending response.  [name], PharmD Clinical Pharmacist [phone]

## 2024-02-07 ENCOUNTER — Telehealth: Payer: Self-pay

## 2024-02-07 NOTE — Progress Notes (Signed)
   02/07/2024  Patient ID: Douglas Hoffman, male   DOB: January 30, 1946, 78 y.o.   MRN: [id number]  Received notification from AZ&Me that patient's application for Farxiga  10mg  PAP has been approved through 05/03/2025.  Notified patient to be on the lookout for first delivery in the next 2-3 weeks. Patient aware to reach out if he has not received the medication in that time frame.  [name], PharmD Clinical Pharmacist [phone]

## 2024-02-28 ENCOUNTER — Other Ambulatory Visit: Payer: Self-pay | Admitting: Family Medicine

## 2024-02-28 MED ORDER — AMLODIPINE BESYLATE 5 MG PO TABS: 5.0000 mg | ORAL_TABLET | Freq: Every morning | ORAL | 0 refills | Status: AC

## 2024-02-28 NOTE — Telephone Encounter (Signed)
 Copied from CRM [id number]. Topic: Clinical - Medication Refill >> Feb 28, 2024  9:52 AM Dedra B wrote: Medication: amLODipine  (NORVASC ) 5 MG tablet (90 day supply)  Has the patient contacted their pharmacy? No, no refills   This is the patient's preferred pharmacy:  [facility] [address] [place], [place] - [address] [place] [address] Phone: [phone] Fax: [phone]  Is this the correct pharmacy for this prescription? Yes   Has the prescription been filled recently? No  Is the patient out of the medication? No, week left  Has the patient been seen for an appointment in the last year OR does the patient have an upcoming appointment? Yes  Can we respond through MyChart? Yes  Agent: Please be advised that Rx refills may take up to 3 business days. We ask that you follow-up with your pharmacy.

## 2024-03-06 ENCOUNTER — Other Ambulatory Visit: Payer: Self-pay | Admitting: Family Medicine

## 2024-03-06 DIAGNOSIS — L2989 Other pruritus: Secondary | ICD-10-CM

## 2024-03-06 NOTE — Telephone Encounter (Unsigned)
 Copied from CRM [id number]. Topic: Clinical - Medication Refill >> Mar 06, 2024 12:23 PM Thersia C wrote: Medication: triamcinolone  cream (KENALOG ) 0.1 %  Has the patient contacted their pharmacy? Yes (Agent: If no, request that the patient contact the pharmacy for the refill. If patient does not wish to contact the pharmacy document the reason why and proceed with request.) (Agent: If yes, when and what did the pharmacy advise?)  This is the patient's preferred pharmacy:  [facility] [address] [place], [place] - [address] [place] [address] Phone: [phone] Fax: [phone]  Is this the correct pharmacy for this prescription? Yes If no, delete pharmacy and type the correct one.   Has the prescription been filled recently? No  Is the patient out of the medication? Yes  Has the patient been seen for an appointment in the last year OR does the patient have an upcoming appointment? Yes  Can we respond through MyChart? Yes  Agent: Please be advised that Rx refills may take up to 3 business days. We ask that you follow-up with your pharmacy.

## 2024-03-07 MED ORDER — TRIAMCINOLONE ACETONIDE 0.1 % EX CREA: 1.0000 | TOPICAL_CREAM | Freq: Two times a day (BID) | CUTANEOUS | 0 refills | Status: AC | PRN

## 2024-03-13 NOTE — Progress Notes (Signed)
 "   Chief Complaint  Patient presents with   Medical Management of Chronic Issues   Discussed the use of AI scribe software for clinical note transcription with the patient, who gave verbal consent to proceed. History of Present Illness Douglas Hoffman is a 78 year old male with past medical history significant for DM 2, CKD 3, hypertension, history of DVT/PE on chronic anticoagulation, CAD, prostate cancer status post prostatectomy, and gout here today for chronic medical management. He was last seen on 01/31/2024 for welcome to Medicare visit.  He had blood work done in July at the oncologist's office, which included CMP and CBC.  According to patient, he is supposed to continue following annually.  He mentions that gout exacerbations are exacerbated by shellfish consumption.  He wonders if he ever will overcome this problem.  He avoids red meat and primarily consumes chicken and turkey. He has no known allergies.  Hypertension and CKD III: Currently on amlodipine  5 mg daily. Negative for unusual or severe headache, visual changes, exertional chest pain, dyspnea,  focal weakness, or edema.  Lab Results  Component Value Date   NA 139 12/01/2023   CL 109 12/01/2023   K 3.9 12/01/2023   CO2 25 12/01/2023   BUN 26 (H) 12/01/2023   CREATININE 1.77 (H) 12/01/2023   GFRNONAA 39 (L) 12/01/2023   CALCIUM  9.6 12/01/2023   PHOS 2.1 (L) 10/07/2021   ALBUMIN  3.9 12/01/2023   GLUCOSE 91 12/01/2023   Diabetes Mellitus II: Diagnosed in 11/2023 with a hemoglobin A1c of 6.6. + Microalbuminuria. Currently he is on Farxiga  10 mg daily, which she has tolerated well. He is not monitoring BS. He has made dietary changes, including reducing fried foods, bread, and sodas, and has increased water  intake, resulting in weight loss since September. Negative for symptoms of hypoglycemia, polyuria, polydipsia, numbness extremities, foot ulcers/trauma  Lab Results  Component Value Date   HGBA1C 6.6 (H)  11/10/2023   Lab Results  Component Value Date   MICROALBUR 4.4 (H) 11/10/2023   Lab Results  Component Value Date   CHOL 113 11/10/2023   HDL 40.30 11/10/2023   LDLCALC 57 11/10/2023   TRIG 81.0 11/10/2023   CHOLHDL 3 11/10/2023   Review of Systems  Constitutional:  Negative for activity change, appetite change, chills and fever.  HENT:  Negative for sore throat.   Respiratory:  Negative for cough and wheezing.   Gastrointestinal:  Negative for abdominal pain, nausea and vomiting.  Genitourinary:  Negative for decreased urine volume, dysuria and hematuria.  Skin:  Negative for rash.  Neurological:  Negative for syncope and facial asymmetry.  Psychiatric/Behavioral:  Negative for confusion and hallucinations.   See other pertinent positives and negatives in HPI.  Current Outpatient Medications on File Prior to Visit  Medication Sig Dispense Refill   acetaminophen  (TYLENOL ) 500 MG tablet Take 1,000 mg by mouth every 6 (six) hours as needed (pain).     amLODipine  (NORVASC ) 5 MG tablet Take 1 tablet (5 mg total) by mouth every morning. 90 tablet 0   apixaban  (ELIQUIS ) 5 MG TABS tablet Take 1 tablet (5 mg total) by mouth 2 (two) times daily. 60 tablet 11   atorvastatin  (LIPITOR ) 80 MG tablet Take 1 tablet (80 mg total) by mouth daily. 30 tablet 11   dapagliflozin  propanediol (FARXIGA ) 10 MG TABS tablet Take 1 tablet (10 mg total) by mouth daily before breakfast. 30 tablet 3   ezetimibe  (ZETIA ) 10 MG tablet Take 1 tablet (10 mg  total) by mouth daily. 90 tablet 3   senna-docusate (SENOKOT-S) 8.6-50 MG tablet Take 1 tablet by mouth daily. 30 tablet 0   triamcinolone  cream (KENALOG ) 0.1 % Apply 1 Application topically 2 (two) times daily as needed. 453.6 g 0   No current facility-administered medications on file prior to visit.   Past Medical History:  Diagnosis Date   Acute venous embolism and thrombosis of unspecified deep vessels of lower extremity    Arthritis    knee    Cancer  (HCC)    Cataract    Clotting disorder    DVT many years  ago    Glaucoma    left eye since age 75   Gout    Hip pain    History of radiation therapy    Prostate   Hypertension    Prostate cancer [facility])    s/p radiation 36 fx   Renal disorder    reports prior to joint replacement surgery , was taking 5 to 6 aleve daily for pain releif, report shas stopped this and kidney function has omporved, kidney fx followed by his PCP       Allergies  Allergen Reactions   Shellfish Allergy Swelling    Gout     Social History   Socioeconomic History   Marital status: Married    Spouse name: Not on file   Number of children: Not on file   Years of education: Not on file   Highest education level: 12th grade  Occupational History   Not on file  Tobacco Use   Smoking status: Never   Smokeless tobacco: Never  Vaping Use   Vaping status: Never Used  Substance and Sexual Activity   Alcohol  use: No   Drug use: No   Sexual activity: Not on file  Other Topics Concern   Not on file  Social History Narrative   Not on file   Social Drivers of Health   Financial Resource Strain: Low Risk  (01/26/2024)   Overall Financial Resource Strain (CARDIA)    Difficulty of Paying Living Expenses: Not hard at all  Food Insecurity: No Food Insecurity (01/26/2024)   Hunger Vital Sign    Worried About Running Out of Food in the Last Year: Never true    Ran Out of Food in the Last Year: Never true  Transportation Needs: No Transportation Needs (01/26/2024)   PRAPARE - Administrator, Civil Service (Medical): No    Lack of Transportation (Non-Medical): No  Physical Activity: Insufficiently Active (01/26/2024)   Exercise Vital Sign    Days of Exercise per Week: 3 days    Minutes of Exercise per Session: 30 min  Stress: No Stress Concern Present (01/26/2024)   [employer] of Occupational Health - Occupational Stress Questionnaire    Feeling of Stress: Not at all  Social Connections:  Unknown (01/26/2024)   Social Connection and Isolation Panel    Frequency of Communication with Friends and Family: More than three times a week    Frequency of Social Gatherings with Friends and Family: Once a week    Attends Religious Services: More than 4 times per year    Active Member of [employer] or Organizations: Not on file    Attends Banker Meetings: Not on file    Marital Status: Married    Today's Vitals   03/14/24 0656  BP: 110/78  Pulse: 75  Resp: 16  Temp: 98 F (36.7 C)  TempSrc: Oral  SpO2: 99%  Weight: 184 lb 12.8 oz (83.8 kg)  Height: 6' 1 (1.854 m)   Body mass index is 24.38 kg/m.  Physical Exam Vitals and nursing note reviewed.  Constitutional:      General: He is not in acute distress.    Appearance: He is well-developed.  HENT:     Head: Normocephalic and atraumatic.     Mouth/Throat:     Mouth: Mucous membranes are moist.     Dentition: Has dentures.  Eyes:     Pupils:     Left eye: Pupil is not reactive.  Cardiovascular:     Rate and Rhythm: Normal rate. Rhythm irregular. Occasional Extrasystoles are present.    Heart sounds: No murmur heard.    Comments: PT pulses palpable. Pulmonary:     Effort: Pulmonary effort is normal. No respiratory distress.     Breath sounds: Normal breath sounds.  Abdominal:     Palpations: Abdomen is soft. There is no hepatomegaly or mass.     Tenderness: There is no abdominal tenderness.  Musculoskeletal:     Right lower leg: No edema.     Left lower leg: No edema.  Skin:    General: Skin is warm.     Findings: No erythema or rash.  Neurological:     General: No focal deficit present.     Mental Status: He is alert and oriented to person, place, and time.     Gait: Gait normal.  Psychiatric:        Mood and Affect: Mood and affect normal.   ASSESSMENT AND PLAN:  Douglas Hoffman was seen today for medical management of chronic issues.  Diagnoses and all orders for this visit: Orders Placed  This Encounter  Procedures   Hemoglobin A1c   Microalbumin / creatinine urine ratio   Basic metabolic panel with GFR   Lab Results  Component Value Date   MICROALBUR 5.5 (H) 03/14/2024   MICROALBUR 4.4 (H) 11/10/2023   Lab Results  Component Value Date   NA 139 03/14/2024   CL 106 03/14/2024   K 4.7 03/14/2024   CO2 26 03/14/2024   BUN 20 03/14/2024   CREATININE 1.69 (H) 03/14/2024   GFR 38.37 (L) 03/14/2024   CALCIUM  9.8 03/14/2024   PHOS 2.1 (L) 10/07/2021   ALBUMIN  3.9 12/01/2023   GLUCOSE 110 (H) 03/14/2024   Lab Results  Component Value Date   HGBA1C 6.4 03/14/2024   Type 2 diabetes mellitus with stage 3b chronic kidney disease, without long-term current use of insulin  (HCC) Assessment & Plan: Last hemoglobin A1c 6.6 in 11/2023. Currently on Farxiga  10 mg daily, no changes today. Regular exercise as tolerated and healthy diet with avoidance of added sugar encouraged. Annual eye exam and foot care recommended. F/U in 5-6 months.  Orders: -     Hemoglobin A1c; Future -     Microalbumin / creatinine urine ratio; Future -     Basic metabolic panel with GFR; Future  Stage 3a chronic kidney disease (HCC) Assessment & Plan: Problem has been stable. Last creatinine 1.7 and eGFR 39. Continue adequate hydration, low-salt diet, and avoidance of NSAIDs as well as good BP and glucose control. Currently on Farxiga  10 mg daily.  Orders: -     Microalbumin / creatinine urine ratio; Future -     Basic metabolic panel with GFR; Future  Primary hypertension Assessment & Plan: BP adequately controlled. Continue amlodipine  5 mg daily and low-salt diet. On auscultation today  occasional extrasystoles, asymptomatic.  Prior EKGs reviewed. He is due for eye exam. Follow-up in 6 months.  Orders: -     Basic metabolic panel with GFR; Future  Gout involving toe, unspecified cause, unspecified chronicity, unspecified laterality Assessment & Plan: Currently  asymptomatic. Recommend continue avoiding foods that exacerbate problem. Continue low purine diet.   Return in about 6 months (around 09/11/2024) for chronic problems.  Jeane Cashatt, MD [facility]. [place] office.   "

## 2024-03-14 ENCOUNTER — Ambulatory Visit: Payer: Self-pay | Admitting: Family Medicine

## 2024-03-14 ENCOUNTER — Encounter: Payer: Self-pay | Admitting: Family Medicine

## 2024-03-14 ENCOUNTER — Ambulatory Visit: Admitting: Family Medicine

## 2024-03-14 VITALS — BP 110/78 | HR 75 | Temp 98.0°F | Resp 16 | Ht 73.0 in | Wt 184.8 lb

## 2024-03-14 DIAGNOSIS — M109 Gout, unspecified: Secondary | ICD-10-CM | POA: Diagnosis not present

## 2024-03-14 DIAGNOSIS — Z7984 Long term (current) use of oral hypoglycemic drugs: Secondary | ICD-10-CM

## 2024-03-14 DIAGNOSIS — N1832 Chronic kidney disease, stage 3b: Secondary | ICD-10-CM

## 2024-03-14 DIAGNOSIS — E1122 Type 2 diabetes mellitus with diabetic chronic kidney disease: Secondary | ICD-10-CM

## 2024-03-14 DIAGNOSIS — N1831 Chronic kidney disease, stage 3a: Secondary | ICD-10-CM | POA: Diagnosis not present

## 2024-03-14 DIAGNOSIS — I10 Essential (primary) hypertension: Secondary | ICD-10-CM

## 2024-03-14 LAB — BASIC METABOLIC PANEL WITH GFR
BUN: 20 mg/dL (ref 6–23)
CO2: 26 meq/L (ref 19–32)
Calcium: 9.8 mg/dL (ref 8.4–10.5)
Chloride: 106 meq/L (ref 96–112)
Creatinine, Ser: 1.69 mg/dL — ABNORMAL HIGH (ref 0.40–1.50)
GFR: 38.37 mL/min — ABNORMAL LOW (ref >60.00)
Glucose, Bld: 110 mg/dL — ABNORMAL HIGH (ref 70–99)
Potassium: 4.7 meq/L (ref 3.5–5.1)
Sodium: 139 meq/L (ref 135–145)

## 2024-03-14 LAB — HEMOGLOBIN A1C: Hgb A1c MFr Bld: 6.4 % (ref 4.6–6.5)

## 2024-03-14 LAB — MICROALBUMIN / CREATININE URINE RATIO
Creatinine,U: 101.6 mg/dL
Microalb Creat Ratio: 54.5 mg/g — ABNORMAL HIGH (ref 0.0–30.0)
Microalb, Ur: 5.5 mg/dL — ABNORMAL HIGH (ref 0.0–1.9)

## 2024-03-14 MED ORDER — DAPAGLIFLOZIN PROPANEDIOL 10 MG PO TABS: 10.0000 mg | ORAL_TABLET | Freq: Every day | ORAL | 2 refills | Status: AC

## 2024-03-14 NOTE — Patient Instructions (Signed)
 A few things to remember from today's visit:  Type 2 diabetes mellitus with stage 3b chronic kidney disease, without long-term current use of insulin  (HCC) - Plan: Hemoglobin A1c, Microalbumin / creatinine urine ratio, Basic metabolic panel with GFR  Stage 3a chronic kidney disease (HCC) - Plan: Microalbumin / creatinine urine ratio, Basic metabolic panel with GFR  Primary hypertension - Plan: Basic metabolic panel with GFR No changes today. Due for eye exam, which canbe done by optometrist and referral is not necessary.  If you need refills for medications you take chronically, please call your pharmacy. Do not use My Chart to request refills or for acute issues that need immediate attention. If you send a my chart message, it may take a few days to be addressed, specially if I am not in the office.  Please be sure medication list is accurate. If a new problem present, please set up appointment sooner than planned today.

## 2024-03-14 NOTE — Assessment & Plan Note (Signed)
 Problem has been stable. Last creatinine 1.7 and eGFR 39. Continue adequate hydration, low-salt diet, and avoidance of NSAIDs as well as good BP and glucose control. Currently on Farxiga  10 mg daily.

## 2024-03-14 NOTE — Assessment & Plan Note (Addendum)
 BP adequately controlled. Continue amlodipine  5 mg daily and low-salt diet. On auscultation today occasional extrasystoles, asymptomatic.  Prior EKGs reviewed. He is due for eye exam. Follow-up in 6 months.

## 2024-03-14 NOTE — Assessment & Plan Note (Signed)
 Last hemoglobin A1c 6.6 in 11/2023. Currently on Farxiga  10 mg daily, no changes today. Regular exercise as tolerated and healthy diet with avoidance of added sugar encouraged. Annual eye exam and foot care recommended. F/U in 5-6 months.

## 2024-03-14 NOTE — Assessment & Plan Note (Signed)
 Currently asymptomatic. Recommend continue avoiding foods that exacerbate problem. Continue low purine diet.

## 2024-05-05 ENCOUNTER — Telehealth: Payer: Self-pay

## 2024-05-05 NOTE — Telephone Encounter (Signed)
 Yes patient can receive flu vaccine at next appointment.   Copied from CRM [id number]. Topic: Clinical - Medication Question >> May 05, 2024  4:22 PM Vena HERO wrote: Reason for CRM: Pt is coming in for an appt on 1/9 and would like to have a flu vaccine added. I could not schedule him for any morning appts for the nurse so he is hoping to get this done while he is already there

## 2024-05-12 ENCOUNTER — Ambulatory Visit: Admitting: Family Medicine

## 2024-05-22 ENCOUNTER — Ambulatory Visit: Admitting: Family Medicine

## 2024-05-22 ENCOUNTER — Encounter: Payer: Self-pay | Admitting: Family Medicine

## 2024-05-22 VITALS — BP 118/78 | HR 77 | Temp 97.8°F | Resp 16 | Ht 72.0 in | Wt 185.8 lb

## 2024-05-22 DIAGNOSIS — N1832 Chronic kidney disease, stage 3b: Secondary | ICD-10-CM

## 2024-05-22 DIAGNOSIS — I10 Essential (primary) hypertension: Secondary | ICD-10-CM | POA: Diagnosis not present

## 2024-05-22 DIAGNOSIS — E785 Hyperlipidemia, unspecified: Secondary | ICD-10-CM | POA: Diagnosis not present

## 2024-05-22 MED ORDER — ATORVASTATIN CALCIUM 80 MG PO TABS: 80.0000 mg | ORAL_TABLET | Freq: Every day | ORAL | 3 refills | Status: AC

## 2024-05-22 NOTE — Assessment & Plan Note (Signed)
 Last LDL 57 in 11/2023. We discussed CV benefits of statins. Recommend continuing atorvastatin  80 mg daily and Zetia  10 mg daily as well as low-fat diet. We will plan on fasting lipid panel at next visit.

## 2024-05-22 NOTE — Assessment & Plan Note (Signed)
 BP adequately controlled. Continue amlodipine  5 mg daily and low salt diet. Recommend monitoring BP at home once or twice per month. He is overdue for eye exam, recommend looking into his health insurance network for a list of providers and arrange an appointment Follow-up in 5 to 6 months.

## 2024-05-22 NOTE — Progress Notes (Addendum)
 "   Chief Complaint  Patient presents with   Follow-up    6 Month follow-up   Discussed the use of AI scribe software for clinical note transcription with the patient, who gave verbal consent to proceed. History of Present Illness Douglas Hoffman is a 79 year old male with past medical history significant for DM 2, CKD 3, hypertension, history of DVT/PE on chronic anticoagulation, CAD, and prostate cancer status post prostatectomy here today for routine follow-up visit. Last seen on 03/14/2024. No new problems or ED visits since his last visit.  Hyperlipidemia: He is currently taking atorvastatin  80 mg daily and Zetia  10 mg daily. He wonders if he needs to continue atorvastatin . He is tolerating medication well. Lab Results  Component Value Date   CHOL 113 11/10/2023   HDL 40.30 11/10/2023   LDLCALC 57 11/10/2023   TRIG 81.0 11/10/2023   CHOLHDL 3 11/10/2023   Hypertension: Currently he is on amlodipine  5 mg daily. He is not monitoring BP at home. Negative for unusual or severe headache, visual changes, exertional chest pain, dyspnea,  focal weakness, or edema.  Lab Results  Component Value Date   NA 139 03/14/2024   CL 106 03/14/2024   K 4.7 03/14/2024   CO2 26 03/14/2024   BUN 20 03/14/2024   CREATININE 1.69 (H) 03/14/2024   GFR 38.37 (L) 03/14/2024   CALCIUM  9.8 03/14/2024   PHOS 2.1 (L) 10/07/2021   ALBUMIN  3.9 12/01/2023   GLUCOSE 110 (H) 03/14/2024   He has not yet scheduled an appointment with an eye care provider for regular eye exams.   Prostate cancer status post prostatectomy.  He sees his oncologist annually.  He feels generally well with no concerns today.  He engages in daily walking for exercise and reports doing well with his current regimen.  Review of Systems  Constitutional:  Negative for activity change, appetite change, chills and fever.  HENT:  Negative for sore throat.   Respiratory:  Negative for cough and wheezing.   Gastrointestinal:   Negative for abdominal pain, nausea and vomiting.  Genitourinary:  Negative for decreased urine volume, dysuria and hematuria.  Skin:  Negative for rash.  Neurological:  Negative for syncope and facial asymmetry.  See other pertinent positives and negatives in HPI.  Medications Ordered Prior to Encounter[1]  Past Medical History:  Diagnosis Date   Acute venous embolism and thrombosis of unspecified deep vessels of lower extremity    Arthritis    knee    Cancer (HCC)    Cataract    Clotting disorder    DVT many years  ago    Glaucoma    left eye since age 70   Gout    Hip pain    History of radiation therapy    Prostate   Hypertension    Prostate cancer [facility])    s/p radiation 36 fx   Renal disorder    reports prior to joint replacement surgery , was taking 5 to 6 aleve daily for pain releif, report shas stopped this and kidney function has omporved, kidney fx followed by his PCP      Allergies[2]  Social History   Socioeconomic History   Marital status: Married    Spouse name: Not on file   Number of children: Not on file   Years of education: Not on file   Highest education level: 12th grade  Occupational History   Not on file  Tobacco Use   Smoking status: Never  Smokeless tobacco: Never  Vaping Use   Vaping status: Never Used  Substance and Sexual Activity   Alcohol  use: No   Drug use: No   Sexual activity: Not on file  Other Topics Concern   Not on file  Social History Narrative   Not on file   Social Drivers of Health   Tobacco Use: Low Risk (05/22/2024)   Patient History    Smoking Tobacco Use: Never    Smokeless Tobacco Use: Never    Passive Exposure: Not on file  Financial Resource Strain: Low Risk (01/26/2024)   Overall Financial Resource Strain (CARDIA)    Difficulty of Paying Living Expenses: Not hard at all  Food Insecurity: No Food Insecurity (01/26/2024)   Epic    Worried About Radiation Protection Practitioner of Food in the Last Year: Never true    Ran Out  of Food in the Last Year: Never true  Transportation Needs: No Transportation Needs (01/26/2024)   Epic    Lack of Transportation (Medical): No    Lack of Transportation (Non-Medical): No  Physical Activity: Insufficiently Active (01/26/2024)   Exercise Vital Sign    Days of Exercise per Week: 3 days    Minutes of Exercise per Session: 30 min  Stress: No Stress Concern Present (01/26/2024)   Douglas Hoffman of Occupational Health - Occupational Stress Questionnaire    Feeling of Stress: Not at all  Social Connections: Unknown (01/26/2024)   Social Connection and Isolation Panel    Frequency of Communication with Friends and Family: More than three times a week    Frequency of Social Gatherings with Friends and Family: Once a week    Attends Religious Services: More than 4 times per year    Active Member of [employer] or Organizations: Not on file    Attends Banker Meetings: Not on file    Marital Status: Married  Depression (PHQ2-9): Low Risk (01/26/2024)   Depression (PHQ2-9)    PHQ-2 Score: 0  Alcohol  Screen: Low Risk (01/26/2024)   Alcohol  Screen    Last Alcohol  Screening Score (AUDIT): 0  Housing: Unknown (01/26/2024)   Epic    Unable to Pay for Housing in the Last Year: No    Number of Times Moved in the Last Year: Not on file    Homeless in the Last Year: No  Utilities: Not At Risk (01/26/2024)   Epic    Threatened with loss of utilities: No  Health Literacy: Adequate Health Literacy (01/26/2024)   B1300 Health Literacy    Frequency of need for help with medical instructions: Never   Vitals:   05/22/24 0724  BP: 118/78  Pulse: 77  Resp: 16  Temp: 97.8 F (36.6 C)  SpO2: 97%   Body mass index is 25.2 kg/m.  Physical Exam Vitals and nursing note reviewed.  Constitutional:      General: He is not in acute distress.    Appearance: He is well-developed.  HENT:     Head: Normocephalic and atraumatic.     Mouth/Throat:     Mouth: Mucous membranes are moist.      Dentition: Has dentures.  Eyes:     Pupils:     Left eye: Pupil is not reactive.  Cardiovascular:     Rate and Rhythm: Normal rate and regular rhythm.     Heart sounds: No murmur heard.    Comments: PT pulses palpable. Pulmonary:     Effort: Pulmonary effort is normal. No respiratory distress.  Breath sounds: Normal breath sounds.  Abdominal:     Palpations: Abdomen is soft. There is no mass.     Tenderness: There is no abdominal tenderness.  Skin:    General: Skin is warm.     Findings: No erythema or rash.  Neurological:     General: No focal deficit present.     Mental Status: He is alert and oriented to person, place, and time.     Gait: Gait normal.  Psychiatric:        Mood and Affect: Mood and affect normal.   ASSESSMENT AND PLAN:  Mr. Ural Acree was seen today for follow-up.  Diagnoses and all orders for this visit: Primary hypertension Assessment & Plan: BP adequately controlled. Continue amlodipine  5 mg daily and low salt diet. Recommend monitoring BP at home once or twice per month. He is overdue for eye exam, recommend looking into his health insurance network for a list of providers and arrange an appointment Follow-up in 5 to 6 months.  Orders: -     Atorvastatin  Calcium ; Take 1 tablet (80 mg total) by mouth daily.  Dispense: 90 tablet; Refill: 3  Hyperlipidemia, unspecified hyperlipidemia type Assessment & Plan: Last LDL 57 in 11/2023. We discussed CV benefits of statins. Recommend continuing atorvastatin  80 mg daily and Zetia  10 mg daily as well as low-fat diet. We will plan on fasting lipid panel at next visit.    Return in about 19 weeks (around 10/02/2024) for chronic problems, Labs.  Greysen Devino G. Dandre Sisler, MD  [facility]. [place] office.     [1]  Current Outpatient Medications on File Prior to Visit  Medication Sig Dispense Refill   acetaminophen  (TYLENOL ) 500 MG tablet Take 1,000 mg by mouth every 6 (six) hours as needed  (pain).     amLODipine  (NORVASC ) 5 MG tablet Take 1 tablet (5 mg total) by mouth every morning. 90 tablet 0   apixaban  (ELIQUIS ) 5 MG TABS tablet Take 1 tablet (5 mg total) by mouth 2 (two) times daily. 60 tablet 11   dapagliflozin  propanediol (FARXIGA ) 10 MG TABS tablet Take 1 tablet (10 mg total) by mouth daily before breakfast. 90 tablet 2   ezetimibe  (ZETIA ) 10 MG tablet Take 1 tablet (10 mg total) by mouth daily. 90 tablet 3   triamcinolone  cream (KENALOG ) 0.1 % Apply 1 Application topically 2 (two) times daily as needed. 453.6 g 0   senna-docusate (SENOKOT-S) 8.6-50 MG tablet Take 1 tablet by mouth daily. (Patient not taking: Reported on 05/22/2024) 30 tablet 0   No current facility-administered medications on file prior to visit.  [2]  Allergies Allergen Reactions   Shellfish Allergy Swelling    Gout    "

## 2024-05-22 NOTE — Patient Instructions (Addendum)
 A few things to remember from today's visit:  Primary hypertension - Plan: atorvastatin  (LIPITOR ) 80 MG tablet  Hyperlipidemia, unspecified hyperlipidemia type  Monitor blood pressure at home 2 times per months. No change today. Fasting labs next visit.  If you need refills for medications you take chronically, please call your pharmacy. Do not use My Chart to request refills or for acute issues that need immediate attention. If you send a my chart message, it may take a few days to be addressed, specially if I am not in the office.  Please be sure medication list is accurate. If a new problem present, please set up appointment sooner than planned today.

## 2024-05-26 ENCOUNTER — Telehealth: Payer: Self-pay | Admitting: Hematology and Oncology

## 2024-05-26 NOTE — Telephone Encounter (Signed)
 Pt called to cancel appt.

## 2024-06-02 ENCOUNTER — Inpatient Hospital Stay: Admitting: Physician Assistant

## 2024-06-02 ENCOUNTER — Inpatient Hospital Stay

## 2024-11-20 ENCOUNTER — Ambulatory Visit: Admitting: Family Medicine
